# Patient Record
Sex: Female | Born: 1985 | Race: Black or African American | Hispanic: No | Smoking: Never smoker
Health system: Southern US, Community
[De-identification: ages and names within clinical notes are randomized; demographics above are authoritative.]

## PROBLEM LIST (undated history)

## (undated) ENCOUNTER — Emergency Department (HOSPITAL_COMMUNITY): Payer: PRIVATE HEALTH INSURANCE | Source: Home / Self Care

## (undated) ENCOUNTER — Inpatient Hospital Stay (HOSPITAL_COMMUNITY): Payer: Self-pay

## (undated) DIAGNOSIS — H539 Unspecified visual disturbance: Secondary | ICD-10-CM

## (undated) DIAGNOSIS — I509 Heart failure, unspecified: Secondary | ICD-10-CM

## (undated) DIAGNOSIS — D649 Anemia, unspecified: Secondary | ICD-10-CM

## (undated) DIAGNOSIS — J189 Pneumonia, unspecified organism: Secondary | ICD-10-CM

## (undated) DIAGNOSIS — G43909 Migraine, unspecified, not intractable, without status migrainosus: Secondary | ICD-10-CM

## (undated) DIAGNOSIS — I10 Essential (primary) hypertension: Secondary | ICD-10-CM

## (undated) DIAGNOSIS — F419 Anxiety disorder, unspecified: Secondary | ICD-10-CM

## (undated) DIAGNOSIS — E049 Nontoxic goiter, unspecified: Secondary | ICD-10-CM

## (undated) DIAGNOSIS — L0591 Pilonidal cyst without abscess: Secondary | ICD-10-CM

## (undated) DIAGNOSIS — O368390 Maternal care for abnormalities of the fetal heart rate or rhythm, unspecified trimester, not applicable or unspecified: Secondary | ICD-10-CM

## (undated) HISTORY — DX: Anxiety disorder, unspecified: F41.9

## (undated) HISTORY — PX: NO PAST SURGERIES: SHX2092

## (undated) HISTORY — DX: Pilonidal cyst without abscess: L05.91

## (undated) HISTORY — DX: Migraine, unspecified, not intractable, without status migrainosus: G43.909

## (undated) HISTORY — DX: Unspecified visual disturbance: H53.9

---
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
## 2009-05-06 ENCOUNTER — Inpatient Hospital Stay (HOSPITAL_COMMUNITY): Admission: AD | Admit: 2009-05-06 | Discharge: 2009-05-06 | Payer: Self-pay | Admitting: Obstetrics and Gynecology

## 2009-05-26 ENCOUNTER — Encounter: Admission: RE | Admit: 2009-05-26 | Discharge: 2009-05-26 | Payer: Self-pay | Admitting: Obstetrics and Gynecology

## 2009-07-10 ENCOUNTER — Inpatient Hospital Stay (HOSPITAL_COMMUNITY): Admission: AD | Admit: 2009-07-10 | Discharge: 2009-07-14 | Payer: Self-pay | Admitting: Obstetrics & Gynecology

## 2009-07-15 ENCOUNTER — Inpatient Hospital Stay (HOSPITAL_COMMUNITY): Admission: AD | Admit: 2009-07-15 | Discharge: 2009-07-17 | Payer: Self-pay | Admitting: Obstetrics & Gynecology

## 2010-05-29 ENCOUNTER — Emergency Department (HOSPITAL_COMMUNITY)
Admission: EM | Admit: 2010-05-29 | Discharge: 2010-05-29 | Payer: Self-pay | Source: Home / Self Care | Admitting: Emergency Medicine

## 2010-06-06 ENCOUNTER — Encounter: Admission: RE | Admit: 2010-06-06 | Discharge: 2010-06-06 | Payer: Self-pay | Admitting: Internal Medicine

## 2010-11-05 ENCOUNTER — Inpatient Hospital Stay (HOSPITAL_COMMUNITY): Admission: EM | Admit: 2010-11-05 | Discharge: 2010-11-06 | Payer: Self-pay | Source: Home / Self Care

## 2010-11-14 LAB — POCT I-STAT, CHEM 8
BUN: 16 mg/dL (ref 6–23)
Calcium, Ion: 1.06 mmol/L — ABNORMAL LOW (ref 1.12–1.32)
Chloride: 107 mEq/L (ref 96–112)
Creatinine, Ser: 0.8 mg/dL (ref 0.4–1.2)
Glucose, Bld: 131 mg/dL — ABNORMAL HIGH (ref 70–99)
HCT: 37 % (ref 36.0–46.0)
Hemoglobin: 12.6 g/dL (ref 12.0–15.0)
Potassium: 4.2 mEq/L (ref 3.5–5.1)
Sodium: 141 mEq/L (ref 135–145)
TCO2: 27 mmol/L (ref 0–100)

## 2010-11-14 LAB — URINALYSIS, ROUTINE W REFLEX MICROSCOPIC
Bilirubin Urine: NEGATIVE
Ketones, ur: 40 mg/dL — AB
Leukocytes, UA: NEGATIVE
Nitrite: NEGATIVE
Protein, ur: 30 mg/dL — AB
Specific Gravity, Urine: 1.039 — ABNORMAL HIGH (ref 1.005–1.030)
Urine Glucose, Fasting: NEGATIVE mg/dL
Urobilinogen, UA: 1 mg/dL (ref 0.0–1.0)
pH: 6 (ref 5.0–8.0)

## 2010-11-14 LAB — DIFFERENTIAL
Basophils Absolute: 0 10*3/uL (ref 0.0–0.1)
Basophils Absolute: 0 10*3/uL (ref 0.0–0.1)
Basophils Relative: 0 % (ref 0–1)
Basophils Relative: 0 % (ref 0–1)
Eosinophils Absolute: 0 10*3/uL (ref 0.0–0.7)
Eosinophils Absolute: 0 10*3/uL (ref 0.0–0.7)
Eosinophils Relative: 0 % (ref 0–5)
Eosinophils Relative: 0 % (ref 0–5)
Lymphocytes Relative: 20 % (ref 12–46)
Lymphocytes Relative: 19 % (ref 12–46)
Lymphs Abs: 2.2 10*3/uL (ref 0.7–4.0)
Lymphs Abs: 1.8 10*3/uL (ref 0.7–4.0)
Monocytes Absolute: 2.6 10*3/uL — ABNORMAL HIGH (ref 0.1–1.0)
Monocytes Absolute: 1 10*3/uL (ref 0.1–1.0)
Monocytes Relative: 24 % — ABNORMAL HIGH (ref 3–12)
Monocytes Relative: 10 % (ref 3–12)
Neutro Abs: 6 10*3/uL (ref 1.7–7.7)
Neutro Abs: 6.9 10*3/uL (ref 1.7–7.7)
Neutrophils Relative %: 56 % (ref 43–77)
Neutrophils Relative %: 71 % (ref 43–77)

## 2010-11-14 LAB — CULTURE, BLOOD (ROUTINE X 2)
Culture  Setup Time: 201201072032
Culture  Setup Time: 201201072033
Culture: NO GROWTH
Culture: NO GROWTH

## 2010-11-14 LAB — COMPREHENSIVE METABOLIC PANEL
ALT: 10 U/L (ref 0–35)
AST: 11 U/L (ref 0–37)
Albumin: 3.4 g/dL — ABNORMAL LOW (ref 3.5–5.2)
Alkaline Phosphatase: 44 U/L (ref 39–117)
BUN: 14 mg/dL (ref 6–23)
CO2: 27 mEq/L (ref 19–32)
Calcium: 9 mg/dL (ref 8.4–10.5)
Chloride: 105 mEq/L (ref 96–112)
Creatinine, Ser: 0.84 mg/dL (ref 0.4–1.2)
GFR calc Af Amer: >60 mL/min (ref >60)
GFR calc non Af Amer: >60 mL/min (ref >60)
Glucose, Bld: 131 mg/dL — ABNORMAL HIGH (ref 70–99)
Potassium: 4.9 mEq/L (ref 3.5–5.1)
Sodium: 138 mEq/L (ref 135–145)
Total Bilirubin: 0.5 mg/dL (ref 0.3–1.2)
Total Protein: 6.8 g/dL (ref 6.0–8.3)

## 2010-11-14 LAB — MAGNESIUM: Magnesium: 2.3 mg/dL (ref 1.5–2.5)

## 2010-11-14 LAB — CBC
HCT: 35.4 % — ABNORMAL LOW (ref 36.0–46.0)
HCT: 35.9 % — ABNORMAL LOW (ref 36.0–46.0)
Hemoglobin: 11.2 g/dL — ABNORMAL LOW (ref 12.0–15.0)
Hemoglobin: 11.3 g/dL — ABNORMAL LOW (ref 12.0–15.0)
MCH: 26.7 pg (ref 26.0–34.0)
MCH: 26.3 pg (ref 26.0–34.0)
MCHC: 31.6 g/dL (ref 30.0–36.0)
MCHC: 31.5 g/dL (ref 30.0–36.0)
MCV: 84.3 fL (ref 78.0–100.0)
MCV: 83.7 fL (ref 78.0–100.0)
Platelets: 287 10*3/uL (ref 150–400)
Platelets: 352 10*3/uL (ref 150–400)
RBC: 4.2 MIL/uL (ref 3.87–5.11)
RBC: 4.29 MIL/uL (ref 3.87–5.11)
RDW: 13.4 % (ref 11.5–15.5)
RDW: 13.3 % (ref 11.5–15.5)
WBC: 10.8 10*3/uL — ABNORMAL HIGH (ref 4.0–10.5)
WBC: 9.7 10*3/uL (ref 4.0–10.5)

## 2010-11-14 LAB — URINE MICROSCOPIC-ADD ON

## 2010-11-14 LAB — STREP A DNA PROBE: Group A Strep Probe: NEGATIVE

## 2010-11-14 LAB — URINE CULTURE
Colony Count: 5000
Culture  Setup Time: 201201080026

## 2010-11-14 LAB — MONONUCLEOSIS SCREEN: Mono Screen: NEGATIVE

## 2010-11-14 LAB — T4, FREE: Free T4: 1.11 ng/dL (ref 0.80–1.80)

## 2010-11-14 LAB — PHOSPHORUS: Phosphorus: 4.1 mg/dL (ref 2.3–4.6)

## 2010-11-14 LAB — TSH: TSH: 0.289 u[IU]/mL — ABNORMAL LOW (ref 0.350–4.500)

## 2011-02-03 LAB — COMPREHENSIVE METABOLIC PANEL
ALT: 18 U/L (ref 0–35)
ALT: 23 U/L (ref 0–35)
ALT: 14 U/L (ref 0–35)
AST: 19 U/L (ref 0–37)
AST: 24 U/L (ref 0–37)
AST: 21 U/L (ref 0–37)
AST: 21 U/L (ref 0–37)
Albumin: 2.6 g/dL — ABNORMAL LOW (ref 3.5–5.2)
Albumin: 3.1 g/dL — ABNORMAL LOW (ref 3.5–5.2)
Albumin: 2.4 g/dL — ABNORMAL LOW (ref 3.5–5.2)
Albumin: 2.4 g/dL — ABNORMAL LOW (ref 3.5–5.2)
Alkaline Phosphatase: 103 U/L (ref 39–117)
Alkaline Phosphatase: 92 U/L (ref 39–117)
Alkaline Phosphatase: 87 U/L (ref 39–117)
BUN: 4 mg/dL — ABNORMAL LOW (ref 6–23)
BUN: 5 mg/dL — ABNORMAL LOW (ref 6–23)
CO2: 24 mEq/L (ref 19–32)
CO2: 25 mEq/L (ref 19–32)
Calcium: 7.8 mg/dL — ABNORMAL LOW (ref 8.4–10.5)
Calcium: 9.2 mg/dL (ref 8.4–10.5)
Calcium: 8.8 mg/dL (ref 8.4–10.5)
Chloride: 108 mEq/L (ref 96–112)
Chloride: 108 mEq/L (ref 96–112)
Creatinine, Ser: 0.74 mg/dL (ref 0.4–1.2)
Creatinine, Ser: 0.77 mg/dL (ref 0.4–1.2)
Creatinine, Ser: 0.88 mg/dL (ref 0.4–1.2)
GFR calc Af Amer: >60 mL/min (ref >60)
GFR calc Af Amer: >60 mL/min (ref >60)
GFR calc Af Amer: >60 mL/min (ref >60)
GFR calc Af Amer: >60 mL/min (ref >60)
GFR calc non Af Amer: >60 mL/min (ref >60)
GFR calc non Af Amer: >60 mL/min (ref >60)
GFR calc non Af Amer: >60 mL/min (ref >60)
Glucose, Bld: 103 mg/dL — ABNORMAL HIGH (ref 70–99)
Glucose, Bld: 104 mg/dL — ABNORMAL HIGH (ref 70–99)
Glucose, Bld: 75 mg/dL (ref 70–99)
Potassium: 3.7 mEq/L (ref 3.5–5.1)
Potassium: 3.9 mEq/L (ref 3.5–5.1)
Potassium: 4.2 mEq/L (ref 3.5–5.1)
Sodium: 137 mEq/L (ref 135–145)
Sodium: 140 mEq/L (ref 135–145)
Sodium: 138 mEq/L (ref 135–145)
Total Bilirubin: 0.5 mg/dL (ref 0.3–1.2)
Total Bilirubin: 0.5 mg/dL (ref 0.3–1.2)
Total Protein: 5.5 g/dL — ABNORMAL LOW (ref 6.0–8.3)
Total Protein: 6.1 g/dL (ref 6.0–8.3)
Total Protein: 5.3 g/dL — ABNORMAL LOW (ref 6.0–8.3)

## 2011-02-03 LAB — CBC
HCT: 31.6 % — ABNORMAL LOW (ref 36.0–46.0)
HCT: 34.4 % — ABNORMAL LOW (ref 36.0–46.0)
Hemoglobin: 10.2 g/dL — ABNORMAL LOW (ref 12.0–15.0)
Hemoglobin: 11 g/dL — ABNORMAL LOW (ref 12.0–15.0)
Hemoglobin: 11.3 g/dL — ABNORMAL LOW (ref 12.0–15.0)
Hemoglobin: 9.5 g/dL — ABNORMAL LOW (ref 12.0–15.0)
MCHC: 32.1 g/dL (ref 30.0–36.0)
MCHC: 32.3 g/dL (ref 30.0–36.0)
MCHC: 32.3 g/dL (ref 30.0–36.0)
MCV: 81 fL (ref 78.0–100.0)
MCV: 82.1 fL (ref 78.0–100.0)
MCV: 82.5 fL (ref 78.0–100.0)
Platelets: 351 10*3/uL (ref 150–400)
Platelets: 353 10*3/uL (ref 150–400)
Platelets: 243 10*3/uL (ref 150–400)
RBC: 3.85 MIL/uL — ABNORMAL LOW (ref 3.87–5.11)
RBC: 4.25 MIL/uL (ref 3.87–5.11)
RBC: 3.58 MIL/uL — ABNORMAL LOW (ref 3.87–5.11)
RBC: 4.28 MIL/uL (ref 3.87–5.11)
RDW: 13.8 % (ref 11.5–15.5)
RDW: 14.3 % (ref 11.5–15.5)
RDW: 13.6 % (ref 11.5–15.5)
WBC: 12.9 10*3/uL — ABNORMAL HIGH (ref 4.0–10.5)
WBC: 15.3 10*3/uL — ABNORMAL HIGH (ref 4.0–10.5)

## 2011-02-03 LAB — URINALYSIS, ROUTINE W REFLEX MICROSCOPIC
Bilirubin Urine: NEGATIVE
Glucose, UA: NEGATIVE mg/dL
Ketones, ur: NEGATIVE mg/dL
Nitrite: NEGATIVE
pH: 6 (ref 5.0–8.0)

## 2011-02-03 LAB — LACTATE DEHYDROGENASE
LDH: 179 U/L (ref 94–250)
LDH: 223 U/L (ref 94–250)
LDH: 172 U/L (ref 94–250)
LDH: 189 U/L (ref 94–250)

## 2011-02-03 LAB — URINE MICROSCOPIC-ADD ON

## 2011-02-03 LAB — GLUCOSE, CAPILLARY
Glucose-Capillary: 101 mg/dL — ABNORMAL HIGH (ref 70–99)
Glucose-Capillary: 103 mg/dL — ABNORMAL HIGH (ref 70–99)
Glucose-Capillary: 71 mg/dL (ref 70–99)
Glucose-Capillary: 84 mg/dL (ref 70–99)

## 2011-02-03 LAB — DIFFERENTIAL
Basophils Absolute: 0 10*3/uL (ref 0.0–0.1)
Eosinophils Relative: 2 % (ref 0–5)
Lymphocytes Relative: 18 % (ref 12–46)
Neutrophils Relative %: 69 % (ref 43–77)

## 2011-02-03 LAB — MAGNESIUM: Magnesium: 6.6 mg/dL (ref 1.5–2.5)

## 2011-02-03 LAB — URIC ACID: Uric Acid, Serum: 6.7 mg/dL (ref 2.4–7.0)

## 2011-09-05 ENCOUNTER — Encounter: Payer: Self-pay | Admitting: *Deleted

## 2011-09-05 ENCOUNTER — Emergency Department (HOSPITAL_COMMUNITY)
Admission: EM | Admit: 2011-09-05 | Discharge: 2011-09-05 | Disposition: A | Discharge status: Home / Self Care | Payer: PRIVATE HEALTH INSURANCE | Attending: Emergency Medicine | Admitting: Emergency Medicine

## 2011-09-05 DIAGNOSIS — R197 Diarrhea, unspecified: Secondary | ICD-10-CM | POA: Insufficient documentation

## 2011-09-05 DIAGNOSIS — R109 Unspecified abdominal pain: Secondary | ICD-10-CM | POA: Insufficient documentation

## 2011-09-05 DIAGNOSIS — Z79899 Other long term (current) drug therapy: Secondary | ICD-10-CM | POA: Insufficient documentation

## 2011-09-05 DIAGNOSIS — R110 Nausea: Secondary | ICD-10-CM | POA: Insufficient documentation

## 2011-09-05 DIAGNOSIS — E119 Type 2 diabetes mellitus without complications: Secondary | ICD-10-CM | POA: Insufficient documentation

## 2011-09-05 MED ORDER — OXYCODONE-ACETAMINOPHEN 5-325 MG PO TABS
1.0000 | ORAL_TABLET | Freq: Once | ORAL | Status: AC
  Administered 2011-09-05: 1 via ORAL
  Filled 2011-09-05: qty 1

## 2011-09-05 MED ORDER — ONDANSETRON HCL 4 MG PO TABS: 4.0000 mg | ORAL_TABLET | Freq: Four times a day (QID) | ORAL | Status: AC

## 2011-09-05 MED ORDER — ONDANSETRON HCL 8 MG PO TABS: 8.0000 mg | ORAL_TABLET | Freq: Once | ORAL | Status: DC

## 2011-09-05 MED ORDER — ONDANSETRON 4 MG PO TBDP
ORAL_TABLET | ORAL | Status: AC
  Administered 2011-09-05: 8 mg
  Filled 2011-09-05: qty 2

## 2011-09-05 NOTE — ED Notes (Signed)
Patient states she has been having diarhhea since 0130.  She has had multiple episodes of diarrhea and reports nausea.  She is also complaining of back pin

## 2011-09-05 NOTE — ED Provider Notes (Signed)
History     CSN: [id number] Arrival date & time: 09/05/2011  7:06 AM   First MD Initiated Contact with Patient 09/05/11 [phone]      Chief Complaint  Patient presents with  . Diarrhea  . Nausea     Patient is a 25 y.o. female presenting with diarrhea. The history is provided by the patient.  Diarrhea The primary symptoms include diarrhea.   patient reports nonbloody diarrhea since 1:30 the morning today.  She also reports nausea without vomiting.  She denies fever or chills.  She reports crampy lower abdominal discomfort.  She denies dysuria she denies urinary frequency.  Denies blood in her stool.  She denies any sick contacts.  Nothing worsens her symptoms.  Nothing improves her symptoms. her symptoms are moderate in severity.  Past Medical History  Diagnosis Date  . Diabetes mellitus     History reviewed. No pertinent past surgical history.  History reviewed. No pertinent family history.  History  Substance Use Topics  . Smoking status: Never Smoker   . Smokeless tobacco: Not on file  . Alcohol Use: Yes     occassionally    OB History    Grav Para Term Preterm Abortions TAB SAB Ect Mult Living                  Review of Systems  Gastrointestinal: Positive for diarrhea.  All other systems reviewed and are negative.    Allergies  Review of patient's allergies indicates no known allergies.  Home Medications   Current Outpatient Rx  Name Route Sig Dispense Refill  . ONDANSETRON HCL 4 MG PO TABS Oral Take 1 tablet (4 mg total) by mouth every 6 (six) hours. 12 tablet 0    BP 134/99  Pulse 94  Temp 97.8 F (36.6 C)  Resp 18  SpO2 99%  Physical Exam  Nursing note and vitals reviewed. Constitutional: She is oriented to person, place, and time. She appears well-developed and well-nourished. No distress.  HENT:  Head: Normocephalic and atraumatic.  Eyes: EOM are normal.  Neck: Normal range of motion.  Cardiovascular: Normal rate, regular rhythm and normal  heart sounds.   Pulmonary/Chest: Effort normal and breath sounds normal.  Abdominal: Soft. She exhibits no distension. There is no tenderness.  Musculoskeletal: Normal range of motion.  Neurological: She is alert and oriented to person, place, and time.  Skin: Skin is warm and dry.  Psychiatric: She has a normal mood and affect. Judgment normal.    ED Course  Procedures (including critical care time)   Labs Reviewed  PREGNANCY, URINE   No results found.   1. Diarrhea       MDM  Well-appearing.  Her abdomen is benign urine pregnancy test is negative she is hydrated appearing at this time.  Nausea improved with Zofran.  Patient requested pain medicine for her crampy abdominal pain.  Percocet was given this will be discharged home with Zofran and will followup with her Dr. in 2-3 days if not improved        Lyanne Co, MD 09/05/11 [phone]

## 2012-09-04 ENCOUNTER — Encounter (HOSPITAL_COMMUNITY): Payer: Self-pay | Admitting: Adult Health

## 2012-09-04 ENCOUNTER — Emergency Department (HOSPITAL_COMMUNITY)
Admission: EM | Admit: 2012-09-04 | Discharge: 2012-09-05 | Disposition: A | Discharge status: Home / Self Care | Payer: PRIVATE HEALTH INSURANCE | Attending: Emergency Medicine | Admitting: Emergency Medicine

## 2012-09-04 DIAGNOSIS — Z7982 Long term (current) use of aspirin: Secondary | ICD-10-CM | POA: Insufficient documentation

## 2012-09-04 DIAGNOSIS — Z79899 Other long term (current) drug therapy: Secondary | ICD-10-CM | POA: Insufficient documentation

## 2012-09-04 DIAGNOSIS — E119 Type 2 diabetes mellitus without complications: Secondary | ICD-10-CM | POA: Insufficient documentation

## 2012-09-04 DIAGNOSIS — I10 Essential (primary) hypertension: Secondary | ICD-10-CM | POA: Insufficient documentation

## 2012-09-04 DIAGNOSIS — R112 Nausea with vomiting, unspecified: Secondary | ICD-10-CM | POA: Insufficient documentation

## 2012-09-04 DIAGNOSIS — G43909 Migraine, unspecified, not intractable, without status migrainosus: Secondary | ICD-10-CM

## 2012-09-04 HISTORY — DX: Essential (primary) hypertension: I10

## 2012-09-04 MED ORDER — METOCLOPRAMIDE HCL 5 MG/ML IJ SOLN
10.0000 mg | Freq: Once | INTRAMUSCULAR | Status: AC
  Administered 2012-09-04: 10 mg via INTRAVENOUS
  Filled 2012-09-04: qty 2

## 2012-09-04 MED ORDER — DIPHENHYDRAMINE HCL 50 MG/ML IJ SOLN
25.0000 mg | Freq: Once | INTRAMUSCULAR | Status: AC
  Administered 2012-09-04: 25 mg via INTRAVENOUS
  Filled 2012-09-04: qty 1

## 2012-09-04 MED ORDER — DEXAMETHASONE SODIUM PHOSPHATE 10 MG/ML IJ SOLN
10.0000 mg | Freq: Once | INTRAMUSCULAR | Status: AC
  Administered 2012-09-04: 10 mg via INTRAVENOUS
  Filled 2012-09-04: qty 1

## 2012-09-04 NOTE — ED Notes (Signed)
CBG 171 

## 2012-09-04 NOTE — ED Provider Notes (Addendum)
History     CSN: [id number]  Arrival date & time 09/04/12  2031   First MD Initiated Contact with Patient 09/04/12 2216      Chief Complaint  Patient presents with  . Migraine    (Consider location/radiation/quality/duration/timing/severity/associated sxs/prior treatment) Patient is a 26 y.o. female presenting with migraines. The history is provided by the patient.  Migraine This is a recurrent problem. The current episode started yesterday. The problem occurs constantly. The problem has not changed since onset.Associated symptoms include headaches. Pertinent negatives include no chest pain, no abdominal pain and no shortness of breath. Associated symptoms comments: Vomiting yesterday. No focal deficits. Exacerbated by: bright lights and loud noise. Nothing relieves the symptoms. Treatments tried: NSAIDs. The treatment provided no relief.    Past Medical History  Diagnosis Date  . Diabetes mellitus   . Hypertension     History reviewed. No pertinent past surgical history.  History reviewed. No pertinent family history.  History  Substance Use Topics  . Smoking status: Never Smoker   . Smokeless tobacco: Not on file  . Alcohol Use: Yes     Comment: occassionally    OB History    Grav Para Term Preterm Abortions TAB SAB Ect Mult Living                  Review of Systems  Constitutional: Negative for fever.  Respiratory: Negative for cough and shortness of breath.   Cardiovascular: Negative for chest pain and leg swelling.  Gastrointestinal: Positive for nausea and vomiting. Negative for abdominal pain.  Neurological: Positive for headaches. Negative for speech difficulty and weakness.  All other systems reviewed and are negative.    Allergies  Review of patient's allergies indicates no known allergies.  Home Medications   Current Outpatient Rx  Name  Route  Sig  Dispense  Refill  . ASPIRIN-ACETAMINOPHEN-CAFFEINE 250-250-65 MG PO TABS   Oral   Take 1 tablet  by mouth every 6 (six) hours as needed. For headache         . GLIPIZIDE ER 5 MG PO TB24   Oral   Take 5 mg by mouth daily.         . IBUPROFEN 800 MG PO TABS   Oral   Take 800 mg by mouth every 8 (eight) hours as needed. For pain         . LISINOPRIL 20 MG PO TABS   Oral   Take 20 mg by mouth daily.           BP 157/92  Pulse 70  Temp 98.1 F (36.7 C) (Oral)  Resp 18  SpO2 100%  Physical Exam  Nursing note and vitals reviewed. Constitutional: She is oriented to person, place, and time. She appears well-developed and well-nourished. No distress.  HENT:  Head: Normocephalic and atraumatic.  Mouth/Throat: Oropharynx is clear and moist.  Eyes: Conjunctivae normal and EOM are normal. Pupils are equal, round, and reactive to light. Right eye exhibits no discharge. Left eye exhibits no discharge.       photophobia  Neck: Normal range of motion. Neck supple. No spinous process tenderness present. No rigidity. No Brudzinski's sign and no Kernig's sign noted.  Cardiovascular: Normal rate, normal heart sounds and intact distal pulses.   No murmur heard. Pulmonary/Chest: Effort normal and breath sounds normal. No respiratory distress. She has no wheezes. She has no rales.  Abdominal: Soft. She exhibits no distension. There is no tenderness.  Musculoskeletal: Normal range of  motion. She exhibits no edema and no tenderness.  Lymphadenopathy:    She has no cervical adenopathy.  Neurological: She is alert and oriented to person, place, and time. She has normal strength. No cranial nerve deficit or sensory deficit. Coordination and gait normal. GCS eye subscore is 4. GCS verbal subscore is 5. GCS motor subscore is 6.  Skin: Skin is warm and dry.  Psychiatric: She has a normal mood and affect. Her behavior is normal.    ED Course  Procedures (including critical care time)  Labs Reviewed  GLUCOSE, CAPILLARY - Abnormal; Notable for the following:    Glucose-Capillary 171 (*)       All other components within normal limits   No results found.   1. Migraine       MDM   Pt with typical migraine HA without sx suggestive of SAH(sudden onset, worst of life, or deficits), infection, or cavernous vein thrombosis.  Normal neuro exam and vital signs. Will give HA cocktail and on re-eval.  11:39 PM Pt feeling a little better but will d/c home.      Gwyneth Sprout, MD 09/04/12 [id number]  Gwyneth Sprout, MD 09/04/12 [phone]

## 2012-09-04 NOTE — ED Notes (Addendum)
presents with left sided migraine that began yesterday but went away this morning and came back around 12 and gradually worsened. associated with light and sound sensitivity and nausea. Pt ia alert and oriented, neurologically intact. Took Ibuprofen and excedrin took pain from 9/10 -7/10.  Has a history of migraines

## 2012-09-10 ENCOUNTER — Ambulatory Visit (INDEPENDENT_AMBULATORY_CARE_PROVIDER_SITE_OTHER): Payer: PRIVATE HEALTH INSURANCE | Admitting: General Surgery

## 2012-09-10 ENCOUNTER — Encounter (INDEPENDENT_AMBULATORY_CARE_PROVIDER_SITE_OTHER): Payer: Self-pay | Admitting: General Surgery

## 2012-09-10 VITALS — BP 126/82 | HR 80 | Temp 98.6°F | Resp 20 | Ht 62.0 in | Wt 175.0 lb

## 2012-09-10 DIAGNOSIS — L0501 Pilonidal cyst with abscess: Secondary | ICD-10-CM

## 2012-09-10 NOTE — Progress Notes (Signed)
Patient ID: Sara [name], female   DOB: [birth year], 26 y.o.   MRN: [id number]  Chief Complaint  Patient presents with  . Follow-up    pilo cyst infected    HPI Sara [name] is a 26 y.o. female.  She is referred by Milus Height, NP at  [facility] medicine at [facility]. She is referred for management of a pilonidal cyst or abscess.  The patient has had some inflammation in this area before but it was very minor and did not require medical intervention. She now presents with a five-day history of progressive pain and swelling. She denies fever or chills. She denies drainage.  Comorbidities include non-insulin-dependent diabetes, hypertension, anxiety, headaches. HPI  Past Medical History  Diagnosis Date  . Infected pilonidal cyst   . Anxiety     diagnosed in 2007  . Diabetes mellitus     type 2 - uncontrolled  . Hypertension     benign essential    No past surgical history on file.  No family history on file.  Social History History  Substance Use Topics  . Smoking status: Never Smoker   . Smokeless tobacco: Not on file  . Alcohol Use: Yes     Comment: occassionally    No Known Allergies  Current Outpatient Prescriptions  Medication Sig Dispense Refill  . ALPRAZolam (XANAX) 0.5 MG tablet Take 0.5 mg by mouth as needed.      Marland Kitchen aspirin-acetaminophen-caffeine (EXCEDRIN MIGRAINE) 250-250-65 MG per tablet Take 1 tablet by mouth every 6 (six) hours as needed. For headache      . glipiZIDE (GLUCOTROL XL) 5 MG 24 hr tablet Take 5 mg by mouth daily.      Marland Kitchen HYDROcodone-acetaminophen (NORCO/VICODIN) 5-325 MG per tablet Take 1 tablet by mouth every 6 (six) hours as needed.      Marland Kitchen ibuprofen (ADVIL,MOTRIN) 800 MG tablet Take 800 mg by mouth every 8 (eight) hours as needed. For pain      . lisinopril (PRINIVIL,ZESTRIL) 20 MG tablet Take 20 mg by mouth daily.      . SUMAtriptan (IMITREX) 100 MG tablet Take 100 mg by mouth as needed. As directed one at headache onset, may  repeat in 2 hours if still persists/do not use more than 2 in 24 hours.        Review of Systems Review of Systems  Constitutional: Negative for fever, chills and unexpected weight change.  HENT: Negative for hearing loss, congestion, sore throat, trouble swallowing and voice change.   Eyes: Negative for visual disturbance.  Respiratory: Negative for cough and wheezing.   Cardiovascular: Negative for chest pain, palpitations and leg swelling.  Gastrointestinal: Negative for nausea, vomiting, abdominal pain, diarrhea, constipation, blood in stool, abdominal distention and anal bleeding.  Genitourinary: Negative for hematuria, vaginal bleeding and difficulty urinating.  Musculoskeletal: Negative for arthralgias.  Skin: Positive for color change. Negative for rash and wound.  Neurological: Negative for seizures, syncope and headaches.  Hematological: Negative for adenopathy. Does not bruise/bleed easily.  Psychiatric/Behavioral: Negative for confusion.    Blood pressure 126/82, pulse 80, temperature 98.6 F (37 C), temperature source Temporal, resp. rate 20, height 5\' 2"  (1.575 m), weight 175 lb (79.379 kg).  Physical Exam Physical Exam  Constitutional: She is oriented to person, place, and time. She appears well-developed and well-nourished. No distress.  HENT:  Head: Normocephalic and atraumatic.  Eyes: Conjunctivae normal and EOM are normal. Pupils are equal, round, and reactive to light.  Musculoskeletal: She exhibits no  edema and no tenderness.  Neurological: She is alert and oriented to person, place, and time. She displays normal reflexes. She exhibits normal muscle tone. Coordination normal.  Skin: Skin is warm and dry. No rash noted. She is not diaphoretic. There is erythema. No pallor.       Pilonidal abscess present mostly on the left side. Betadine prep, 1% Xylocaine and epinephrine, parasagittal incision made, abscess drained, packed loosely with iodoform gauze. Painful but  tolerated well.  Psychiatric: She has a normal mood and affect. Her behavior is normal. Judgment and thought content normal.    Data Reviewed Office notes from [place] [facility]  Assessment    Pilonidal abscess, drained in the office today Hypertension Anxiety Non-insulin-dependent diabetes    Plan    Incision and drainage performed today. Wound packed loosely The patient has prescription for Septra DS. Take this until all gone Prescription for Percocet given Warm tub baths 3 times daily Remove packing in 24-36 hours and did not repack Return to office 2 weeks, sooner if not rapidly and improving.       Angelia Mould. Derrell Lolling, M.D., [facility] General and Minimally invasive Surgery Breast and Colorectal Surgery Office:   [phone] Pager:   [phone]  09/10/2012, 2:49 PM

## 2012-09-10 NOTE — Patient Instructions (Signed)
Take a warm tub bath 2 or 3 times a day. Keep clean dry gauze on the wound to absorb the drainage.  Remove the packing in 36-48 hours;   do not repack  You will be given a prescription for narcotic pain medication. Do not drive a car if you are taking this medication  It is important for you to fill the prescription for the antibiotic(Septra DS) that was given to you yesterday. Take all these antibiotics as prescribed until they're all gone.  Return to see Dr. Derrell Lolling in 2 weeks, sooner if you do not rapidly improve.

## 2012-09-30 ENCOUNTER — Ambulatory Visit (INDEPENDENT_AMBULATORY_CARE_PROVIDER_SITE_OTHER): Payer: PRIVATE HEALTH INSURANCE | Admitting: General Surgery

## 2012-09-30 ENCOUNTER — Encounter (INDEPENDENT_AMBULATORY_CARE_PROVIDER_SITE_OTHER): Payer: PRIVATE HEALTH INSURANCE | Admitting: General Surgery

## 2012-09-30 ENCOUNTER — Encounter (INDEPENDENT_AMBULATORY_CARE_PROVIDER_SITE_OTHER): Payer: Self-pay | Admitting: General Surgery

## 2012-09-30 VITALS — BP 122/70 | HR 74 | Temp 97.8°F | Resp 16 | Ht 62.0 in | Wt 171.5 lb

## 2012-09-30 DIAGNOSIS — L0501 Pilonidal cyst with abscess: Secondary | ICD-10-CM

## 2012-09-30 NOTE — Patient Instructions (Signed)
You had a pilonidal cyst which was complicated by an abscess, which is an infection. This was drained in the office with a minor surgical procedure at your last visit. This has now completely healed.  I do not recommend any further surgery at this point. You may need further surgery if this continues to recur.  You may resume normal activities now, except that I would avoid a bicycle riding, motorcycle riding and  horse back riding for about 3 weeks. On January 1, you may resume normal activities without restriction.  Return to see Dr. Derrell Lolling if further problems arise.

## 2012-09-30 NOTE — Progress Notes (Signed)
Patient ID: Sara [name], female   DOB: June 16, 1986, 26 y.o.   MRN: [id number] History: This patient what underwent incision and drainage of a Pilonidal  abscess on 09/10/2012 , limited to the left side. She has completed her course of antibiotics. She says the wound has healed. There is no more drainage, no pain, and no bleeding.  Exam: Patient looks well. No distress Incision just to the left of the midline  has completely healed. This is nontender. There is no drainage. No sign of infection. No sign of residual fluid collection  Assessment:, Pilonidal Abscess, initial episode, now completely healed following incision and drainage  Plan :  I discussed the natural history of the  abscess with her. I advised against definitive surgical intervention at this time since this was her initial episode. She was advised that this may or may not recur, and may or may not require more extensive surgery. Return to see me when necessary Activities discussed.    Angelia Mould. Derrell Lolling, M.D., [facility] General and Minimally invasive Surgery Breast and Colorectal Surgery Office:   [phone] Pager:   [phone]

## 2012-12-14 ENCOUNTER — Other Ambulatory Visit: Payer: Self-pay

## 2013-02-03 ENCOUNTER — Emergency Department (HOSPITAL_COMMUNITY)
Admission: EM | Admit: 2013-02-03 | Discharge: 2013-02-04 | Disposition: A | Discharge status: Home / Self Care | Payer: No Typology Code available for payment source | Attending: Emergency Medicine | Admitting: Emergency Medicine

## 2013-02-03 ENCOUNTER — Encounter (HOSPITAL_COMMUNITY): Payer: Self-pay | Admitting: *Deleted

## 2013-02-03 DIAGNOSIS — R51 Headache: Secondary | ICD-10-CM | POA: Insufficient documentation

## 2013-02-03 DIAGNOSIS — IMO0001 Reserved for inherently not codable concepts without codable children: Secondary | ICD-10-CM | POA: Insufficient documentation

## 2013-02-03 DIAGNOSIS — R197 Diarrhea, unspecified: Secondary | ICD-10-CM | POA: Insufficient documentation

## 2013-02-03 DIAGNOSIS — Z3202 Encounter for pregnancy test, result negative: Secondary | ICD-10-CM | POA: Insufficient documentation

## 2013-02-03 DIAGNOSIS — B349 Viral infection, unspecified: Secondary | ICD-10-CM

## 2013-02-03 DIAGNOSIS — E119 Type 2 diabetes mellitus without complications: Secondary | ICD-10-CM | POA: Insufficient documentation

## 2013-02-03 DIAGNOSIS — I10 Essential (primary) hypertension: Secondary | ICD-10-CM | POA: Insufficient documentation

## 2013-02-03 DIAGNOSIS — R6883 Chills (without fever): Secondary | ICD-10-CM | POA: Insufficient documentation

## 2013-02-03 DIAGNOSIS — Z79899 Other long term (current) drug therapy: Secondary | ICD-10-CM | POA: Insufficient documentation

## 2013-02-03 DIAGNOSIS — Z8659 Personal history of other mental and behavioral disorders: Secondary | ICD-10-CM | POA: Insufficient documentation

## 2013-02-03 DIAGNOSIS — B9789 Other viral agents as the cause of diseases classified elsewhere: Secondary | ICD-10-CM | POA: Insufficient documentation

## 2013-02-03 DIAGNOSIS — R112 Nausea with vomiting, unspecified: Secondary | ICD-10-CM | POA: Insufficient documentation

## 2013-02-03 DIAGNOSIS — R630 Anorexia: Secondary | ICD-10-CM | POA: Insufficient documentation

## 2013-02-03 LAB — COMPREHENSIVE METABOLIC PANEL WITH GFR
ALT: 10 U/L (ref 0–35)
AST: 12 U/L (ref 0–37)
Alkaline Phosphatase: 83 U/L (ref 39–117)
CO2: 24 meq/L (ref 19–32)
Calcium: 9.5 mg/dL (ref 8.4–10.5)
Chloride: 98 meq/L (ref 96–112)
GFR calc Af Amer: >90 mL/min (ref >90)
GFR calc non Af Amer: >90 mL/min (ref >90)
Glucose, Bld: 250 mg/dL — ABNORMAL HIGH (ref 70–99)
Sodium: 134 meq/L — ABNORMAL LOW (ref 135–145)
Total Bilirubin: 0.6 mg/dL (ref 0.3–1.2)

## 2013-02-03 LAB — CBC WITH DIFFERENTIAL/PLATELET
Basophils Absolute: 0 K/uL (ref 0.0–0.1)
Basophils Relative: 0 % (ref 0–1)
Eosinophils Absolute: 0 10*3/uL (ref 0.0–0.7)
Eosinophils Relative: 0 % (ref 0–5)
HCT: 39.8 % (ref 36.0–46.0)
Hemoglobin: 13.3 g/dL (ref 12.0–15.0)
Lymphocytes Relative: 4 % — ABNORMAL LOW (ref 12–46)
Lymphs Abs: 0.4 K/uL — ABNORMAL LOW (ref 0.7–4.0)
MCH: 25.8 pg — ABNORMAL LOW (ref 26.0–34.0)
MCHC: 33.4 g/dL (ref 30.0–36.0)
MCV: 77.1 fL — ABNORMAL LOW (ref 78.0–100.0)
Monocytes Absolute: 1.1 K/uL — ABNORMAL HIGH (ref 0.1–1.0)
Monocytes Relative: 9 % (ref 3–12)
Neutro Abs: 10.2 K/uL — ABNORMAL HIGH (ref 1.7–7.7)
Neutrophils Relative %: 87 % — ABNORMAL HIGH (ref 43–77)
Platelets: 293 K/uL (ref 150–400)
RBC: 5.16 MIL/uL — ABNORMAL HIGH (ref 3.87–5.11)
RDW: 13.7 % (ref 11.5–15.5)
WBC: 11.8 K/uL — ABNORMAL HIGH (ref 4.0–10.5)

## 2013-02-03 LAB — GLUCOSE, CAPILLARY: Glucose-Capillary: 167 mg/dL — ABNORMAL HIGH (ref 70–99)

## 2013-02-03 LAB — URINALYSIS, ROUTINE W REFLEX MICROSCOPIC
Bilirubin Urine: NEGATIVE
Glucose, UA: >1000 mg/dL — AB
Hgb urine dipstick: NEGATIVE
Ketones, ur: >80 mg/dL — AB
Leukocytes, UA: NEGATIVE
Nitrite: NEGATIVE
Protein, ur: NEGATIVE mg/dL
Specific Gravity, Urine: 1.025 (ref 1.005–1.030)
Urobilinogen, UA: 0.2 mg/dL (ref 0.0–1.0)
pH: 6 (ref 5.0–8.0)

## 2013-02-03 LAB — LIPASE, BLOOD: Lipase: 16 U/L (ref 11–59)

## 2013-02-03 LAB — COMPREHENSIVE METABOLIC PANEL
Albumin: 4.6 g/dL (ref 3.5–5.2)
BUN: 13 mg/dL (ref 6–23)
Creatinine, Ser: 0.62 mg/dL (ref 0.50–1.10)
Potassium: 4 mEq/L (ref 3.5–5.1)
Total Protein: 8.1 g/dL (ref 6.0–8.3)

## 2013-02-03 LAB — URINE MICROSCOPIC-ADD ON

## 2013-02-03 LAB — POCT PREGNANCY, URINE: Preg Test, Ur: NEGATIVE

## 2013-02-03 MED ORDER — DEXAMETHASONE SODIUM PHOSPHATE 10 MG/ML IJ SOLN
10.0000 mg | Freq: Once | INTRAMUSCULAR | Status: AC
  Administered 2013-02-03: 10 mg via INTRAVENOUS
  Filled 2013-02-03: qty 1

## 2013-02-03 MED ORDER — ONDANSETRON HCL 4 MG/2ML IJ SOLN
4.0000 mg | Freq: Once | INTRAMUSCULAR | Status: AC
  Administered 2013-02-03: 4 mg via INTRAVENOUS
  Filled 2013-02-03: qty 2

## 2013-02-03 MED ORDER — ONDANSETRON 8 MG PO TBDP: ORAL_TABLET | ORAL | Status: DC

## 2013-02-03 MED ORDER — KETOROLAC TROMETHAMINE 30 MG/ML IJ SOLN
30.0000 mg | Freq: Once | INTRAMUSCULAR | Status: AC
  Administered 2013-02-03: 30 mg via INTRAVENOUS
  Filled 2013-02-03: qty 1

## 2013-02-03 MED ORDER — METOCLOPRAMIDE HCL 5 MG/ML IJ SOLN
10.0000 mg | Freq: Once | INTRAMUSCULAR | Status: AC
Start: 2013-02-03 — End: 2013-02-03
  Administered 2013-02-03: 10 mg via INTRAVENOUS
  Filled 2013-02-03: qty 2

## 2013-02-03 MED ORDER — SODIUM CHLORIDE 0.9 % IV BOLUS (SEPSIS)
1000.0000 mL | Freq: Once | INTRAVENOUS | Status: AC
  Administered 2013-02-03: 1000 mL via INTRAVENOUS

## 2013-02-03 MED ORDER — ONDANSETRON 4 MG PO TBDP
4.0000 mg | ORAL_TABLET | Freq: Once | ORAL | Status: AC
  Administered 2013-02-03: 4 mg via ORAL

## 2013-02-03 MED ORDER — ONDANSETRON 4 MG PO TBDP
ORAL_TABLET | ORAL | Status: AC
  Filled 2013-02-03: qty 1

## 2013-02-03 MED ORDER — IBUPROFEN 800 MG PO TABS
800.0000 mg | ORAL_TABLET | Freq: Once | ORAL | Status: AC
  Administered 2013-02-03: 800 mg via ORAL
  Filled 2013-02-03: qty 1

## 2013-02-03 MED ORDER — DIPHENHYDRAMINE HCL 50 MG/ML IJ SOLN
25.0000 mg | Freq: Once | INTRAMUSCULAR | Status: AC
  Administered 2013-02-03: 25 mg via INTRAVENOUS
  Filled 2013-02-03: qty 1

## 2013-02-03 NOTE — ED Notes (Signed)
Pt unable to void at this time.  Urine spec cup at bedside

## 2013-02-03 NOTE — ED Notes (Signed)
Pt is here with abdominal pain, vomiting, diarrhea, and body aches.  LMP;  01/20/13.  Pt reports lower back pain.

## 2013-02-03 NOTE — ED Provider Notes (Signed)
History     CSN: [id number]  Arrival date & time 02/03/13  1632   First MD Initiated Contact with Patient 02/03/13 2003      Chief Complaint  Patient presents with  . Abdominal Pain  . Emesis   HPI  History provided by patient and family. Patient is a 27 year old female with history of hypertension and diabetes who presents with weights of acute onset nausea, vomiting and diarrhea. Symptoms began early this morning and are associated with diffuse body aches and abdominal cramps. She also complains of a headache with chills and sweats. Patient attempted to use some Pepto-Bismol earlier in the day but reports vomiting that shortly after taking it. You have difficulty keeping down any food or fluids. She has not used any other medicines or treatment for symptoms. She was around a young family member who also had vomiting or diarrhea symptoms recently. She has no recent travel. No unusual or undercooked foods.    Past Medical History  Diagnosis Date  . Infected pilonidal cyst   . Anxiety     diagnosed in 2007  . Diabetes mellitus     type 2 - uncontrolled  . Hypertension     benign essential    History reviewed. No pertinent past surgical history.  No family history on file.  History  Substance Use Topics  . Smoking status: Never Smoker   . Smokeless tobacco: Not on file  . Alcohol Use: Yes     Comment: occassionally    OB History   Grav Para Term Preterm Abortions TAB SAB Ect Mult Living                  Review of Systems  Constitutional: Positive for chills and appetite change.  Respiratory: Negative for cough and shortness of breath.   Cardiovascular: Negative for chest pain.  Gastrointestinal: Positive for nausea, vomiting, abdominal pain and diarrhea. Negative for constipation and blood in stool.  Musculoskeletal: Positive for myalgias and back pain.  Neurological: Positive for headaches. Negative for dizziness and light-headedness.  All other systems reviewed  and are negative.    Allergies  Review of patient's allergies indicates no known allergies.  Home Medications   Current Outpatient Rx  Name  Route  Sig  Dispense  Refill  . fexofenadine (ALLEGRA) 180 MG tablet   Oral   Take 180 mg by mouth daily.           BP 135/90  Pulse 129  Temp(Src) 97.8 F (36.6 C) (Oral)  Resp 20  SpO2 98%  LMP 01/20/2013  Physical Exam  Nursing note and vitals reviewed. Constitutional: She is oriented to person, place, and time. She appears well-developed and well-nourished. No distress.  HENT:  Head: Normocephalic.  Cardiovascular: Regular rhythm.  Tachycardia present.   No murmur heard. Pulmonary/Chest: Effort normal and breath sounds normal. No respiratory distress. She has no wheezes.  Abdominal: Soft. She exhibits no distension. There is tenderness. There is no rebound, no guarding, no CVA tenderness, no tenderness at McBurney's point and negative Murphy's sign.  Mild diffuse tenderness  Musculoskeletal: Normal range of motion.  Neurological: She is alert and oriented to person, place, and time.  Skin: Skin is warm and dry. No rash noted.  Psychiatric: She has a normal mood and affect. Her behavior is normal.    ED Course  Procedures   Results for orders placed during the hospital encounter of 02/03/13  CBC WITH DIFFERENTIAL      Result Value  Range   WBC 11.8 (*) 4.0 - 10.5 K/uL   RBC 5.16 (*) 3.87 - 5.11 MIL/uL   Hemoglobin 13.3  12.0 - 15.0 g/dL   HCT 40.9  81.1 - 91.4 %   MCV 77.1 (*) 78.0 - 100.0 fL   MCH 25.8 (*) 26.0 - 34.0 pg   MCHC 33.4  30.0 - 36.0 g/dL   RDW 78.2  95.6 - 21.3 %   Platelets 293  150 - 400 K/uL   Neutrophils Relative 87 (*) 43 - 77 %   Neutro Abs 10.2 (*) 1.7 - 7.7 K/uL   Lymphocytes Relative 4 (*) 12 - 46 %   Lymphs Abs 0.4 (*) 0.7 - 4.0 K/uL   Monocytes Relative 9  3 - 12 %   Monocytes Absolute 1.1 (*) 0.1 - 1.0 K/uL   Eosinophils Relative 0  0 - 5 %   Eosinophils Absolute 0.0  0.0 - 0.7 K/uL    Basophils Relative 0  0 - 1 %   Basophils Absolute 0.0  0.0 - 0.1 K/uL  COMPREHENSIVE METABOLIC PANEL      Result Value Range   Sodium 134 (*) 135 - 145 mEq/L   Potassium 4.0  3.5 - 5.1 mEq/L   Chloride 98  96 - 112 mEq/L   CO2 24  19 - 32 mEq/L   Glucose, Bld 250 (*) 70 - 99 mg/dL   BUN 13  6 - 23 mg/dL   Creatinine, Ser 0.86  0.50 - 1.10 mg/dL   Calcium 9.5  8.4 - 57.8 mg/dL   Total Protein 8.1  6.0 - 8.3 g/dL   Albumin 4.6  3.5 - 5.2 g/dL   AST 12  0 - 37 U/L   ALT 10  0 - 35 U/L   Alkaline Phosphatase 83  39 - 117 U/L   Total Bilirubin 0.6  0.3 - 1.2 mg/dL   GFR calc non Af Amer >90  >90 mL/min   GFR calc Af Amer >90  >90 mL/min  LIPASE, BLOOD      Result Value Range   Lipase 16  11 - 59 U/L  URINALYSIS, ROUTINE W REFLEX MICROSCOPIC      Result Value Range   Color, Urine YELLOW  YELLOW   APPearance CLEAR  CLEAR   Specific Gravity, Urine 1.025  1.005 - 1.030   pH 6.0  5.0 - 8.0   Glucose, UA >1000 (*) NEGATIVE mg/dL   Hgb urine dipstick NEGATIVE  NEGATIVE   Bilirubin Urine NEGATIVE  NEGATIVE   Ketones, ur >80 (*) NEGATIVE mg/dL   Protein, ur NEGATIVE  NEGATIVE mg/dL   Urobilinogen, UA 0.2  0.0 - 1.0 mg/dL   Nitrite NEGATIVE  NEGATIVE   Leukocytes, UA NEGATIVE  NEGATIVE  GLUCOSE, CAPILLARY      Result Value Range   Glucose-Capillary 239 (*) 70 - 99 mg/dL   Comment 1 Documented in Chart     Comment 2 Notify RN    URINE MICROSCOPIC-ADD ON      Result Value Range   Squamous Epithelial / LPF RARE  RARE   WBC, UA 0-2  <3 WBC/hpf   Bacteria, UA RARE  RARE  GLUCOSE, CAPILLARY      Result Value Range   Glucose-Capillary 167 (*) 70 - 99 mg/dL  POCT PREGNANCY, URINE      Result Value Range   Preg Test, Ur NEGATIVE  NEGATIVE   Anion gap equals 12     1. Nausea vomiting  and diarrhea   2. Viral syndrome       MDM  8:25 PM patient seen and evaluated. Patient resting calmly in no apparent distress. She does not appear in any significant discomfort. She is not  appear severely ill or toxic.  Patient with some hyperglycemia. No anion gap. Fluids ordered. Patient does appear slightly dehydrated. Also slightly tachycardic.  Symptoms consistent with viral GI process. If she was near young child with similar symptoms.  Patient continues to do well. Headache has resolved completely. Heart rate improving with fluids patient appears well. Blood sugar also improved. She is tolerating by mouth fluids. At this time suspect symptoms cause by viral infection. No other concerning findings this time. Patient stable for discharge home.    Angus Seller, PA-C 02/03/13 2340

## 2013-02-03 NOTE — ED Notes (Signed)
The pts headache is gone. She remains alert family at the bedside.  ivs finished

## 2013-02-04 NOTE — ED Provider Notes (Signed)
Medical screening examination/treatment/procedure(s) were performed by non-physician practitioner and as supervising physician I was immediately available for consultation/collaboration.   Sara [name]. Ardyn Forge, MD 02/04/13 2314

## 2013-02-04 NOTE — ED Notes (Signed)
Iv not documented.  removed

## 2013-08-10 ENCOUNTER — Emergency Department (HOSPITAL_COMMUNITY): Payer: PRIVATE HEALTH INSURANCE

## 2013-08-10 ENCOUNTER — Encounter (HOSPITAL_COMMUNITY): Payer: Self-pay | Admitting: Emergency Medicine

## 2013-08-10 ENCOUNTER — Emergency Department (HOSPITAL_COMMUNITY)
Admission: EM | Admit: 2013-08-10 | Discharge: 2013-08-10 | Disposition: A | Discharge status: Home / Self Care | Payer: PRIVATE HEALTH INSURANCE | Attending: Emergency Medicine | Admitting: Emergency Medicine

## 2013-08-10 DIAGNOSIS — Z872 Personal history of diseases of the skin and subcutaneous tissue: Secondary | ICD-10-CM | POA: Insufficient documentation

## 2013-08-10 DIAGNOSIS — I10 Essential (primary) hypertension: Secondary | ICD-10-CM | POA: Insufficient documentation

## 2013-08-10 DIAGNOSIS — M79609 Pain in unspecified limb: Secondary | ICD-10-CM | POA: Insufficient documentation

## 2013-08-10 DIAGNOSIS — Z79899 Other long term (current) drug therapy: Secondary | ICD-10-CM | POA: Insufficient documentation

## 2013-08-10 DIAGNOSIS — IMO0001 Reserved for inherently not codable concepts without codable children: Secondary | ICD-10-CM | POA: Insufficient documentation

## 2013-08-10 DIAGNOSIS — M79674 Pain in right toe(s): Secondary | ICD-10-CM

## 2013-08-10 DIAGNOSIS — Z8659 Personal history of other mental and behavioral disorders: Secondary | ICD-10-CM | POA: Insufficient documentation

## 2013-08-10 MED ORDER — IBUPROFEN 800 MG PO TABS: 800.0000 mg | ORAL_TABLET | Freq: Three times a day (TID) | ORAL | Status: DC

## 2013-08-10 NOTE — ED Provider Notes (Signed)
CSN: [id number]     Arrival date & time 08/10/13  1109 History  This chart was scribed for non-physician practitioner Sharilyn Sites, PA-C, working with Bonnita Levan. Bernette Mayers, MD by Dorothey Baseman, ED Scribe. This patient was seen in room TR05C/TR05C and the patient's care was started at 12:04 PM.    Chief Complaint  Patient presents with  . Toe Pain   The history is provided by the patient. No language interpreter was used.   HPI Comments: Sara [name] is a 27 y.o. female who presents to the Emergency Department complaining of a constant, throbbing pain to the right big toe onset last night secondary to dropping a can of whipped cream  on the toe at the base of the nail.  No numbness or paresthesias of toe or foot.  Has not been able to put on a normal shoe due to pain.  Pain worse with weight bearing and ambulation.  Taken tylenol without relief.  Past Medical History  Diagnosis Date  . Infected pilonidal cyst   . Anxiety     diagnosed in 2007  . Diabetes mellitus     type 2 - uncontrolled  . Hypertension     benign essential   No past surgical history on file. No family history on file. History  Substance Use Topics  . Smoking status: Never Smoker   . Smokeless tobacco: Not on file  . Alcohol Use: Yes     Comment: occassionally   OB History   Grav Para Term Preterm Abortions TAB SAB Ect Mult Living                 Review of Systems  Musculoskeletal: Positive for arthralgias. Negative for joint swelling.  All other systems reviewed and are negative.    Allergies  Review of patient's allergies indicates no known allergies.  Home Medications   Current Outpatient Rx  Name  Route  Sig  Dispense  Refill  . fexofenadine (ALLEGRA) 180 MG tablet   Oral   Take 180 mg by mouth daily.         . ondansetron (ZOFRAN ODT) 8 MG disintegrating tablet      8mg  ODT q4 hours prn nausea   20 tablet   0    Triage Vitals: BP 163/87  Pulse 94  Temp(Src) 97.9 F (36.6 C)  (Oral)  Resp 16  SpO2 99%  Physical Exam  Nursing note and vitals reviewed. Constitutional: She is oriented to person, place, and time. She appears well-developed and well-nourished.  HENT:  Head: Normocephalic and atraumatic.  Eyes: Conjunctivae and EOM are normal.  Neck: Normal range of motion.  Cardiovascular: Normal rate, regular rhythm and normal heart sounds.   Pulmonary/Chest: Effort normal and breath sounds normal. No respiratory distress.  Abdominal: Soft. She exhibits no distension.  Musculoskeletal: Normal range of motion.       Feet:  Right great toe with pain at base of nail. Nail intact without deformity.  No laceration, paronychia, or signs of infection. Full flexion and extension with some pain. Strong capillary refill. Sensation intact.  Neurological: She is alert and oriented to person, place, and time.  Distal sensation is intact.  Skin: Skin is warm and dry.  Psychiatric: She has a normal mood and affect.    ED Course  Procedures (including critical care time)  DIAGNOSTIC STUDIES: Oxygen Saturation is 99% on room air, normal by my interpretation.    COORDINATION OF CARE: 11:23 AM- Discussed negative x-ray results  and that there are no signs of infection. Advised patient to wear open-toed shoes until symptoms subside. Will discharge patient with a post-op shoe and ibuprofen. Discussed treatment plan with patient at bedside and patient verbalized agreement.     Labs Review Labs Reviewed - No data to display  Imaging Review Dg Toe Great Right  08/10/2013   CLINICAL DATA:  Injury to right great toe.  EXAM: RIGHT GREAT TOE  COMPARISON:  None.  FINDINGS: There is no evidence of fracture or dislocation. There is no evidence of arthropathy or other focal bone abnormality. Soft tissues are unremarkable.  IMPRESSION: Negative.   Electronically Signed   By: Amie Portland M.D.   On: 08/10/2013 11:59    EKG Interpretation   None       MDM   1. Great toe  pain, right    X-rays negative for acute fracture or dislocation. Patient given postop shoe. Rx Motrin. She will followup with: Clinic if problems occur. Discussed plan with patient, she agreed. Return precautions advised.   I personally performed the services described in this documentation, which was scribed in my presence. The recorded information has been reviewed and is accurate.    Garlon Hatchet, PA-C 08/10/13 1345

## 2013-08-10 NOTE — ED Notes (Signed)
Pt from home, c/o right great toe pain. Pt states she dropped a can on toe at base of nail.

## 2013-08-12 NOTE — ED Provider Notes (Signed)
Medical screening examination/treatment/procedure(s) were performed by non-physician practitioner and as supervising physician I was immediately available for consultation/collaboration.   Charles B. Bernette Mayers, MD 08/12/13 1225

## 2013-09-04 ENCOUNTER — Other Ambulatory Visit: Payer: Self-pay

## 2014-07-31 ENCOUNTER — Telehealth (HOSPITAL_COMMUNITY): Payer: Self-pay | Admitting: Family Medicine

## 2014-07-31 ENCOUNTER — Encounter (HOSPITAL_COMMUNITY): Payer: Self-pay | Admitting: Emergency Medicine

## 2014-07-31 ENCOUNTER — Emergency Department (INDEPENDENT_AMBULATORY_CARE_PROVIDER_SITE_OTHER)
Admission: EM | Admit: 2014-07-31 | Discharge: 2014-07-31 | Disposition: A | Discharge status: Home / Self Care | Payer: BC Managed Care – PPO | Source: Home / Self Care | Attending: Family Medicine | Admitting: Family Medicine

## 2014-07-31 DIAGNOSIS — L739 Follicular disorder, unspecified: Secondary | ICD-10-CM

## 2014-07-31 DIAGNOSIS — E119 Type 2 diabetes mellitus without complications: Secondary | ICD-10-CM

## 2014-07-31 DIAGNOSIS — Z794 Long term (current) use of insulin: Secondary | ICD-10-CM

## 2014-07-31 MED ORDER — FLUCONAZOLE 150 MG PO TABS: 150.0000 mg | ORAL_TABLET | Freq: Once | ORAL | Status: DC

## 2014-07-31 MED ORDER — MUPIROCIN 2 % EX OINT: 1.0000 "application " | TOPICAL_OINTMENT | Freq: Two times a day (BID) | CUTANEOUS | Status: DC

## 2014-07-31 MED ORDER — SULFAMETHOXAZOLE-TRIMETHOPRIM 800-160 MG PO TABS: 2.0000 | ORAL_TABLET | Freq: Two times a day (BID) | ORAL | Status: DC

## 2014-07-31 NOTE — ED Provider Notes (Signed)
Sara [name] is a 28 y.o. female who presents to Urgent Care today for rash. Patient has a one-day history of small painful rash on her forehead. She has a dime-sized patch of pustules in her central forward scalp. No fevers or chills nausea vomiting or diarrhea. No significant radiating pain. No other rash. No treatments tried yet. Symptoms are moderate.   Past Medical History  Diagnosis Date  . Infected pilonidal cyst   . Anxiety     diagnosed in 2007  . Diabetes mellitus     type 2 - uncontrolled  . Hypertension     benign essential   History  Substance Use Topics  . Smoking status: Never Smoker   . Smokeless tobacco: Not on file  . Alcohol Use: Yes     Comment: occassionally   ROS as above Medications: No current facility-administered medications for this encounter.   Current Outpatient Prescriptions  Medication Sig Dispense Refill  . acetaminophen (TYLENOL) 500 MG tablet Take 1,000 mg by mouth every 6 (six) hours as needed for pain.      Marland Kitchen ibuprofen (ADVIL,MOTRIN) 800 MG tablet Take 1 tablet (800 mg total) by mouth 3 (three) times daily.  21 tablet  0  . mupirocin ointment (BACTROBAN) 2 % Apply 1 application topically 2 (two) times daily.  30 g  0  . sulfamethoxazole-trimethoprim (SEPTRA DS) 800-160 MG per tablet Take 2 tablets by mouth 2 (two) times daily.  28 tablet  0    Exam:  BP 129/87  Pulse 84  Temp(Src) 97.8 F (36.6 C) (Oral)  Resp 18  SpO2 99%  LMP 07/29/2014 Gen: Well NAD HEENT: EOMI,  MMM Lungs: Normal work of breathing. CTABL Heart: RRR no MRG Abd: NABS, Soft. Nondistended, Nontender Exts: Brisk capillary refill, warm and well perfused.  Skin: Small dime-sized erythematous-based grouped pustules forward scalp central. No other rash. Tender to touch.  No results found for this or any previous visit (from the past 24 hour(s)). No results found.  Assessment and Plan: 28 y.o. female with rash. Likely folliculitis. Shingles is a possibility but  location is central and patient does not have unilateral symptoms. Plan to treat with Septra and Bactroban. Watchful waiting. Followup as needed.  Discussed warning signs or symptoms. Please see discharge instructions. Patient expresses understanding.     Gregor Hams, MD 07/31/14 [phone]

## 2014-07-31 NOTE — ED Notes (Signed)
C/o rash on scalp/forehead onset yest; size of a dime Itches and painful Denies inj/trauma, f/v/n/d Alert, no signs of acute distress.

## 2014-07-31 NOTE — Discharge Instructions (Signed)
Thank you for coming in today. Take Bactrim twice daily for one week. Make sure to use birth control taking this medication. Applymupirocin antibiotic ointment twice or 3 times daily.  Return if not improving or worsening.  Folliculitis  Folliculitis is redness, soreness, and swelling (inflammation) of the hair follicles. This condition can occur anywhere on the body. People with weakened immune systems, diabetes, or obesity have a greater risk of getting folliculitis. CAUSES  Bacterial infection. This is the most common cause.  Fungal infection.  Viral infection.  Contact with certain chemicals, especially oils and tars. Long-term folliculitis can result from bacteria that live in the nostrils. The bacteria may trigger multiple outbreaks of folliculitis over time. SYMPTOMS Folliculitis most commonly occurs on the scalp, thighs, legs, back, buttocks, and areas where hair is shaved frequently. An early sign of folliculitis is a small, white or yellow, pus-filled, itchy lesion (pustule). These lesions appear on a red, inflamed follicle. They are usually less than 0.2 inches (5 mm) wide. When there is an infection of the follicle that goes deeper, it becomes a boil or furuncle. A group of closely packed boils creates a larger lesion (carbuncle). Carbuncles tend to occur in hairy, sweaty areas of the body. DIAGNOSIS  Your caregiver can usually tell what is wrong by doing a physical exam. A sample may be taken from one of the lesions and tested in a lab. This can help determine what is causing your folliculitis. TREATMENT  Treatment may include:  Applying warm compresses to the affected areas.  Taking antibiotic medicines orally or applying them to the skin.  Draining the lesions if they contain a large amount of pus or fluid.  Laser hair removal for cases of long-lasting folliculitis. This helps to prevent regrowth of the hair. HOME CARE INSTRUCTIONS  Apply warm compresses to the  affected areas as directed by your caregiver.  If antibiotics are prescribed, take them as directed. Finish them even if you start to feel better.  You may take over-the-counter medicines to relieve itching.  Do not shave irritated skin.  Follow up with your caregiver as directed. SEEK IMMEDIATE MEDICAL CARE IF:   You have increasing redness, swelling, or pain in the affected area.  You have a fever. MAKE SURE YOU:  Understand these instructions.  Will watch your condition.  Will get help right away if you are not doing well or get worse. Document Released: 12/25/2001 Document Revised: 04/16/2012 Document Reviewed: 01/16/2012 [facility] Patient Information 2015 [place], [place]. This information is not intended to replace advice given to you by your health care provider. Make sure you discuss any questions you have with your health care provider.

## 2014-07-31 NOTE — ED Notes (Signed)
Patient typically gets yeast infections with antibiotics. I have called fluconazole for use as needed.  Gregor Hams, MD 07/31/14 1011

## 2014-08-07 ENCOUNTER — Encounter (HOSPITAL_COMMUNITY): Payer: Self-pay | Admitting: Emergency Medicine

## 2014-08-07 ENCOUNTER — Emergency Department (INDEPENDENT_AMBULATORY_CARE_PROVIDER_SITE_OTHER)
Admission: EM | Admit: 2014-08-07 | Discharge: 2014-08-07 | Disposition: A | Discharge status: Home / Self Care | Payer: BC Managed Care – PPO | Source: Home / Self Care | Attending: Family Medicine | Admitting: Family Medicine

## 2014-08-07 DIAGNOSIS — E119 Type 2 diabetes mellitus without complications: Secondary | ICD-10-CM

## 2014-08-07 DIAGNOSIS — T7840XA Allergy, unspecified, initial encounter: Secondary | ICD-10-CM

## 2014-08-07 DIAGNOSIS — L739 Follicular disorder, unspecified: Secondary | ICD-10-CM

## 2014-08-07 MED ORDER — METHYLPREDNISOLONE SODIUM SUCC 125 MG IJ SOLR
125.0000 mg | Freq: Once | INTRAMUSCULAR | Status: AC
  Administered 2014-08-07: 125 mg via INTRAMUSCULAR

## 2014-08-07 MED ORDER — METHYLPREDNISOLONE SODIUM SUCC 125 MG IJ SOLR
INTRAMUSCULAR | Status: AC
  Filled 2014-08-07: qty 2

## 2014-08-07 MED ORDER — PREDNISONE 50 MG PO TABS: ORAL_TABLET | ORAL | Status: DC

## 2014-08-07 NOTE — Discharge Instructions (Signed)
You are experiencing an allergic reaction - likely to the bactrim The infection appears to be gone so you can completely stop this medicine You were given a shot of steroid to help with the allergic reaction Please continue this therapy with prednisone 50mg  every morning with breakfast Please also use benadryl to help with the rash Please check your sugar regularly as it will go up while on prednisone If your sugar is above 200 than please increase your lantus by 3-5 units that night.  Please decrease your dose by 3-5 units if your glucose is below 100.  Please stop the steroid if your sugar are above 300 or feeling worse.

## 2014-08-07 NOTE — ED Notes (Signed)
Pt given Solu-Medrol injection.  Pt tolerated well.  Will stay for 15 minutes to observe for any adverse reactions.  Pt is sitting comfortably on the bed. A&O w/ VSS.

## 2014-08-07 NOTE — ED Provider Notes (Signed)
CSN: [id number]     Arrival date & time 08/07/14  1140 History   First MD Initiated Contact with Patient 08/07/14 1226     Chief Complaint  Patient presents with  . Allergic Reaction    antibiotic   (Consider location/radiation/quality/duration/timing/severity/associated sxs/prior Treatment) HPI  Rash: new rash since last appt where pt given bactrim for possible folliclulitis on head. That rash is improving. New rash started about 4-5 days after starting the antibiotic. Very itchy and keeps pt up at night. No changein soaps lotions or detergents or change in food selection. Rash is mostly on arms. Getting worse. Has not taken anything for the rash. Stopped bactrim 2 days ago due to difficulty swallowing the pills. No dysphagia or SOB or CP.   DM: poorly controlled per pt. Lantus 26 units QHS. Has tried several oral medications through PCP but has not tolerated them well.   Past Medical History  Diagnosis Date  . Infected pilonidal cyst   . Anxiety     diagnosed in 2007  . Diabetes mellitus     type 2 - uncontrolled  . Hypertension     benign essential   History reviewed. No pertinent past surgical history. History reviewed. No pertinent family history. History  Substance Use Topics  . Smoking status: Never Smoker   . Smokeless tobacco: Not on file  . Alcohol Use: Yes     Comment: occassionally   OB History   Grav Para Term Preterm Abortions TAB SAB Ect Mult Living                 Review of Systems Per HPI with all other pertinent systems negative.   Allergies  Lobster and Latex  Home Medications   Prior to Admission medications   Medication Sig Start Date End Date Taking? Authorizing Provider  insulin glargine (LANTUS) 100 UNIT/ML injection Inject into the skin at bedtime.   Yes Historical Provider, MD  lisinopril (PRINIVIL,ZESTRIL) 10 MG tablet Take 10 mg by mouth daily.   Yes Historical Provider, MD  sitaGLIPtin (JANUVIA) 50 MG tablet Take 50 mg by mouth daily.    Yes Historical Provider, MD  acetaminophen (TYLENOL) 500 MG tablet Take 1,000 mg by mouth every 6 (six) hours as needed for pain.    Historical Provider, MD  fluconazole (DIFLUCAN) 150 MG tablet Take 1 tablet (150 mg total) by mouth once. 07/31/14   Gregor Hams, MD  ibuprofen (ADVIL,MOTRIN) 800 MG tablet Take 1 tablet (800 mg total) by mouth 3 (three) times daily. 08/10/13   Larene Pickett, PA-C  mupirocin ointment (BACTROBAN) 2 % Apply 1 application topically 2 (two) times daily. 07/31/14   Gregor Hams, MD  predniSONE (DELTASONE) 50 MG tablet Take daily with breakfast 08/07/14   Waldemar Dickens, MD  sulfamethoxazole-trimethoprim (SEPTRA DS) 800-160 MG per tablet Take 2 tablets by mouth 2 (two) times daily. 07/31/14   Gregor Hams, MD   LMP 07/29/2014 Physical Exam  Constitutional: She is oriented to person, place, and time. She appears well-developed and well-nourished.  HENT:  Head: Normocephalic and atraumatic.  Eyes: EOM are normal. Pupils are equal, round, and reactive to light.  Neck: Normal range of motion.  Cardiovascular: Normal rate, normal heart sounds and intact distal pulses.   No murmur heard. Pulmonary/Chest: Effort normal and breath sounds normal.  Abdominal: Soft.  Musculoskeletal: Normal range of motion.  Neurological: She is alert and oriented to person, place, and time.  Skin: Skin is warm.  Diffuse macular papular rash on arms and back.   Psychiatric: She has a normal mood and affect. Her behavior is normal. Judgment and thought content normal.    ED Course  Procedures (including critical care time) Labs Review Labs Reviewed - No data to display  Imaging Review No results found.   MDM   1. Allergic reaction, initial encounter   2. Diabetes mellitus without complication   3. Folliculitis    Allergic reaction.  - stop the bactrim - solumedrol 125mg  IM given in office - start benadryl - prednisone 50 Qday x 4 more days  DM:  lantus titration schedule  given.  Pt aware of change in glucose levels while on adn when coming off prednisone and will adjust lantus accordingly.  Stop prednisone if over 300 and getting symptoms  Folliculitis: resolved from previous. 5 days of ABX were likely sufficieint.    Precautions given and all questions answered   Linna Darner, MD Family Medicine 08/07/2014, 12:49 PM      Waldemar Dickens, MD 08/07/14 1249

## 2014-08-08 ENCOUNTER — Telehealth (HOSPITAL_COMMUNITY): Payer: Self-pay | Admitting: Emergency Medicine

## 2014-08-08 MED ORDER — INSULIN PEN NEEDLE 31G X 6 MM MISC: Status: DC

## 2014-08-08 MED ORDER — INSULIN LISPRO 100 UNIT/ML (KWIKPEN): PEN_INJECTOR | SUBCUTANEOUS | Status: DC

## 2014-08-08 NOTE — ED Notes (Signed)
The patient calls in this evening after having been seen yesterday because of allergic reaction. She is a diabetic and was prescribed prednisone. She states she's no longer itching. She has been taking some Zyrtec as well. Her blood sugar is high this afternoon at 412. She is on Lantus insulin and Januvia. She is nothing short acting to get her sugar down. She was told to stop the prednisone, take Zantac for the itching, and we'll call in some Humalog for her to take 15 units with each meal. This would be in addition to her Lantus and her Januvia.  Harden Mo, MD 08/08/14 [phone]

## 2014-09-07 LAB — OB RESULTS CONSOLE RPR: RPR: NONREACTIVE

## 2014-09-07 LAB — OB RESULTS CONSOLE HEPATITIS B SURFACE ANTIGEN: HEP B S AG: NEGATIVE

## 2014-09-07 LAB — OB RESULTS CONSOLE GC/CHLAMYDIA
Chlamydia: NEGATIVE
Gonorrhea: NEGATIVE

## 2014-09-07 LAB — OB RESULTS CONSOLE RUBELLA ANTIBODY, IGM: Rubella: IMMUNE

## 2014-09-07 LAB — OB RESULTS CONSOLE ABO/RH: RH Type: POSITIVE

## 2014-09-07 LAB — OB RESULTS CONSOLE HIV ANTIBODY (ROUTINE TESTING): HIV: NONREACTIVE

## 2014-09-07 LAB — OB RESULTS CONSOLE ANTIBODY SCREEN: Antibody Screen: NEGATIVE

## 2014-09-11 ENCOUNTER — Other Ambulatory Visit: Payer: Self-pay | Admitting: Obstetrics and Gynecology

## 2014-10-01 ENCOUNTER — Ambulatory Visit (HOSPITAL_COMMUNITY)
Admission: RE | Admit: 2014-10-01 | Discharge: 2014-10-01 | Disposition: A | Discharge status: Home / Self Care | Payer: BC Managed Care – PPO | Source: Ambulatory Visit | Attending: Obstetrics & Gynecology | Admitting: Obstetrics & Gynecology

## 2014-10-01 ENCOUNTER — Encounter: Payer: BC Managed Care – PPO | Attending: Obstetrics & Gynecology | Admitting: *Deleted

## 2014-10-01 DIAGNOSIS — Z713 Dietary counseling and surveillance: Secondary | ICD-10-CM | POA: Diagnosis not present

## 2014-10-01 DIAGNOSIS — O24919 Unspecified diabetes mellitus in pregnancy, unspecified trimester: Principal | ICD-10-CM

## 2014-10-01 DIAGNOSIS — E139 Other specified diabetes mellitus without complications: Secondary | ICD-10-CM | POA: Insufficient documentation

## 2014-10-01 DIAGNOSIS — O24319 Unspecified pre-existing diabetes mellitus in pregnancy, unspecified trimester: Secondary | ICD-10-CM | POA: Diagnosis present

## 2014-10-01 DIAGNOSIS — Z794 Long term (current) use of insulin: Principal | ICD-10-CM

## 2014-10-01 DIAGNOSIS — IMO0001 Reserved for inherently not codable concepts without codable children: Secondary | ICD-10-CM

## 2014-10-01 NOTE — Progress Notes (Signed)
  Patient was seen on 10/01/14 for consult related to Diabetes self-management complicated by pregnancy. Sara [name] is presently taking Lantus Insulin 39unit HS and Januvia 168m po daily. Although Januvia is a Pregnancy Class B medication there is no supporting research in its use during pregnancy. In consult with Dr. DErnie Hewwe will discontinue the use of Januvia immediately. The patient will be testing her glucose as directed [facility]. At that time we will have concrete data in order to further evaluate treatment regimen. SCretais testing her glucose sporadically. Her current FBS readings 90-1049mdl and 2hpp 150's. I discussed with the patient the potential need for meal time insulin coverage. She is comfortable with this possibility.   The following learning objectives were met by the patient :   States why dietary management is important in controlling blood glucose  Describes the effects of carbohydrates on blood glucose levels  Demonstrates ability to create a balanced meal plan  Demonstrates carbohydrate counting   States when to check blood glucose levels  Demonstrates proper blood glucose monitoring techniques  States the effect of stress and exercise on blood glucose levels  Plan:  Aim for 2 Carb Choices per meal (30 grams) +/- 1 either way for breakfast Aim for 3 Carb Choices per meal (45 grams) +/- 1 either way from lunch and dinner Aim for 1-2 Carbs per snack Begin reading food labels for Total Carbohydrate and sugar grams of foods Consider  increasing your activity level by walking daily as tolerated Begin checking BG before breakfast and 2 hours after first bit of breakfast, lunch and dinner after  as directed by MD  Take medication  as directed by MD  Patient instructed to monitor glucose levels: FBS: 60 - <90 2 hour: <120  Patient received the following handouts:  Carbohydrate Counting List  Meal Planning  worksheet  Patient will be seen for follow-up as needed.

## 2014-10-02 ENCOUNTER — Ambulatory Visit (HOSPITAL_COMMUNITY): Payer: BC Managed Care – PPO

## 2014-10-02 ENCOUNTER — Other Ambulatory Visit (HOSPITAL_COMMUNITY): Payer: Self-pay

## 2014-10-06 ENCOUNTER — Ambulatory Visit (HOSPITAL_COMMUNITY)
Admission: RE | Admit: 2014-10-06 | Discharge: 2014-10-06 | Disposition: A | Discharge status: Home / Self Care | Payer: BC Managed Care – PPO | Source: Ambulatory Visit | Attending: Obstetrics & Gynecology | Admitting: Obstetrics & Gynecology

## 2014-10-06 ENCOUNTER — Encounter (HOSPITAL_COMMUNITY): Payer: Self-pay

## 2014-10-06 DIAGNOSIS — D473 Essential (hemorrhagic) thrombocythemia: Secondary | ICD-10-CM | POA: Diagnosis not present

## 2014-10-06 DIAGNOSIS — O10011 Pre-existing essential hypertension complicating pregnancy, first trimester: Secondary | ICD-10-CM | POA: Insufficient documentation

## 2014-10-06 DIAGNOSIS — Z794 Long term (current) use of insulin: Secondary | ICD-10-CM | POA: Insufficient documentation

## 2014-10-06 DIAGNOSIS — O24111 Pre-existing diabetes mellitus, type 2, in pregnancy, first trimester: Secondary | ICD-10-CM | POA: Insufficient documentation

## 2014-10-06 DIAGNOSIS — Z3A09 9 weeks gestation of pregnancy: Secondary | ICD-10-CM | POA: Insufficient documentation

## 2014-10-06 DIAGNOSIS — E1165 Type 2 diabetes mellitus with hyperglycemia: Secondary | ICD-10-CM | POA: Insufficient documentation

## 2014-10-06 NOTE — Consult Note (Signed)
Maternal Fetal Medicine Consultation  Requesting Provider(s): Archie Balboa, FNP-BC/ Carmon Sails, MD  Primary OB: [facility]  Reason for consultation: Pre gestational diabetes, thrombocytosis  HPI: Sara [name] is a 28 yo G3P0111 currently at 7w 6d who is seen for consultation due to pre gestational diabetes and thrombocytosis.  Ms. Safer reports that she was diagnosed with diabetes about 3 years ago following the delivery of her child - that pregnancy was complicated by gestational diabetes and testing post partum was consisted with diabetes.  The patient was previously on Januvia and Lantis.  Her Januvia was recently stopped after her visit with the diabetic counselor.  Her baseline Hb A1C was 8.1 percent.  The patient reports that she feels that it was somewhat elevated due to steroid use that she was taking due to a skin rash.  The patient denies any known end-organ disease.  Her last Optho evaluation was about a year ago.  She had a baseline 24 hr urine for protein and creatine clearance.  Ms. Seiter also reports a history of chronic hypertension.  She was previously on Lisinopril that was stopped prior to becoming pregnant and is currently not taking anything for blood pressure.  The patient's CBC with her new OB labs showed mildly a mildly elevated platelet count of 466K; repeat on 12/2 was 371K.  She denies any prior history of elevated platelets.  After stopping her Januvia, the patient's post prandial fingerstick values were elevated to the 200's.  She was recently started on Novolin R - her current insulin dose is 39 units of Lantis qHS and 8 units of Novolin R at breakfast and 10 units of Novolin R with dinner.  She is without complaints today.  OB History: OB History    Gravida Para Term Preterm AB TAB SAB Ectopic Multiple Living   3 1  1 1  1   1     Preterm delivery at 35 weeks - developed preeclampsia post partum.  Pregnancy complicated by GDM.  Had early  SAB.  PMH:  Past Medical History  Diagnosis Date  . Infected pilonidal cyst   . Anxiety     diagnosed in 2007  . Diabetes mellitus     type 2 - uncontrolled  . Hypertension     benign essential  . Headache     PSH:  Past Surgical History  Procedure Laterality Date  . No past surgeries     Meds:  Lantis 39 units qHS, Novolin R 8 units with breakfast and 10 units with dinner              Prenatal vitamins  Allergies:  Allergies  Allergen Reactions  . Septra Ds [Sulfamethoxazole-Trimethoprim] Hives and Itching  . Lobster [Shellfish Allergy] Itching    Throat itching  . Latex Rash   FH: Denies family history of birth defects or hereditary disorders.  Mother, paternal aunt, grandparents - DM; Chronic hypertension - father, grandfather  Soc:  History   Social History  . Marital Status: Married    Spouse Name: N/A    Number of Children: N/A  . Years of Education: N/A   Occupational History  . Not on file.   Social History Main Topics  . Smoking status: Never Smoker   . Smokeless tobacco: Not on file  . Alcohol Use: Yes     Comment: occassionally  . Drug Use: No  . Sexual Activity: Yes    Birth Control/ Protection: IUD   Other Topics  Concern  . Not on file   Social History Narrative    A/P: 1) Single IUP at 9w 6d         2) Type 2 diabetes, insulin requiring - baseline HbA1C 8.1%.  Had brief discussion regarding potential risks to the fetus to include congenital birth defects, fetal macrosomia, risk of stillbirth if poorly controlled, risk of birth injury and cesarean delivery.  Fingerstick values reviewed - continues to have some elevated post-prandial values, but overall well controlled.         3) Thrombocytosis - do not feel that the isolated elevated platelet count of 476 is of any clinical significance.  Repeat was within normal limits.  May consider repeating each trimester - would consider hematology consultation if plts persistently > 500K.         4)  Hx of Chronic hypertension - borderline values in clinic today.  Would have low threshold to start antihypertensive medications if SBP persistently > 140 or DBP persistently > 90.  My choice of medication would be either Procardia XL or Labetalol.  Recommendations: 1) Recommend switching insulin to Novolog with meals.  New insulin dose: 39 units Lantis qHS and Novolog 8 units with each meal.  The patient will continue to check 4x daily fingerstick values.  She will need close follow up at least every 2 weeks for OB visits initially and adjustments of her insulin dose to maintain fasting values in the 60-90 range; 2-hr post prandial values < 130.  The patient has our contact information if she has fingerstick values < 60 or > 150. 2) Detailed ultrasound for anatomy at 18-20 weeks.  Please contact our office if you would prefer that this be scheduled with MFM. 3) Fetal echo at 20-[redacted] weeks gestation. 4) Serial growth scans in the 3rd trimester. 5) Antepartum fetal testing beginning at 32 weeks 6) Delivery at 39 weeks in the absence of other complications.   Thank you for the opportunity to be a part of the care of Sara [name]. Please contact our office if we can be of further assistance.   I spent approximately 30 minutes with this patient with over 50% of time spent in face-to-face counseling.  Benjaman Lobe, MD Maternal Fetal Medicine

## 2014-10-06 NOTE — ED Notes (Signed)
Prescription for Novolog vial called to [facility] per Dr. Lisbeth Renshaw.  Pt to take 8u with each meal..

## 2014-10-07 ENCOUNTER — Other Ambulatory Visit (HOSPITAL_COMMUNITY): Payer: Self-pay

## 2014-10-30 NOTE — L&D Delivery Note (Addendum)
I was called to see pt of [facility] who is 35 weeks in the second stage of labor.  The FHR showed a prolonged deceleration.  After assessing the patients pelvic I obtained verbal consent and placed a flat Kiwi vacuum extractor. The fetus was delivered over an intact perineum with 1 pull.  There were no complications.  The placenta was delivered by the midwife.   Sara [name], M.D., [place]       Final Labor Progress Note At 2952 pt reports an increased in rectal pressure.  FHR remained reassuring with occasional variable decelerations.  Vaginal Delivery Note The pt utilized an epidural as pain management.   Spontaneous rupture of membranes today, at 0014, clear.  GBS was negative.  Cervical dilation was complete at  1125.  NICHD Category 2.    Pushing with guidance began at  1126.  Prolong decel noted.  Dr Marolyn Hammock notified, DrCarolyn L. [name] called to the room, Peds called for delivery.   After 10 minutes of pushing the head, shoulders and the body of a viable female infant "Sara [name]" delivered with vacuum assistance (see Dr Hoyle Sauer L. [name]'s note above) and maternal effort in the ROA position at 1136.   With vigorous tone and spontaneous cry, the infant was placed on moms abd.  After the umbilical cord was clamped it was cut by the FOB, the infant was handed to the NICU team for immediate assessment .    Spontaneous delivery of a intact placenta with a 3 vessel cord via Shultz at  1145.   Episiotomy: None   The vulva, perineum, vaginal vault, rectum and cervix were inspected no repairs needed.  Postpartum pitocin as ordered.  Fundus firm, lochia minimum, bleeding under control.  EBL 50, Pt hemodynamically stable.   Sponge, laps and needle count correct and verified with the primary care nurse.  Attending MD available at all times.    Routine postpartum orders   Mother desires Nexplanon for contraception   Infant to have out patient circumcision   Placenta  to pathology: NO     Cord Gases sent to lab: NO Cord blood sent to lab: YES   APGARS:  8 at 1 minute and 9 at 5 minutes Weight:. 5lb 12.8oz     Both mom and baby were left in stable condition      Sara [name], CNM, MSN 04/01/2015. 12:14 PM

## 2014-12-04 ENCOUNTER — Encounter (HOSPITAL_COMMUNITY): Payer: Self-pay | Admitting: *Deleted

## 2014-12-04 ENCOUNTER — Inpatient Hospital Stay (HOSPITAL_COMMUNITY)
Admission: AD | Admit: 2014-12-04 | Discharge: 2014-12-05 | Disposition: A | Discharge status: Home / Self Care | Payer: BLUE CROSS/BLUE SHIELD | Source: Ambulatory Visit | Attending: Obstetrics and Gynecology | Admitting: Obstetrics and Gynecology

## 2014-12-04 DIAGNOSIS — Z794 Long term (current) use of insulin: Secondary | ICD-10-CM | POA: Diagnosis not present

## 2014-12-04 DIAGNOSIS — E119 Type 2 diabetes mellitus without complications: Secondary | ICD-10-CM | POA: Insufficient documentation

## 2014-12-04 DIAGNOSIS — O132 Gestational [pregnancy-induced] hypertension without significant proteinuria, second trimester: Secondary | ICD-10-CM | POA: Diagnosis not present

## 2014-12-04 DIAGNOSIS — Z8679 Personal history of other diseases of the circulatory system: Secondary | ICD-10-CM

## 2014-12-04 DIAGNOSIS — F411 Generalized anxiety disorder: Secondary | ICD-10-CM

## 2014-12-04 DIAGNOSIS — Z3A18 18 weeks gestation of pregnancy: Secondary | ICD-10-CM | POA: Diagnosis not present

## 2014-12-04 DIAGNOSIS — O24112 Pre-existing diabetes mellitus, type 2, in pregnancy, second trimester: Secondary | ICD-10-CM | POA: Diagnosis not present

## 2014-12-04 DIAGNOSIS — Z8669 Personal history of other diseases of the nervous system and sense organs: Secondary | ICD-10-CM

## 2014-12-04 DIAGNOSIS — D649 Anemia, unspecified: Secondary | ICD-10-CM

## 2014-12-04 DIAGNOSIS — O09899 Supervision of other high risk pregnancies, unspecified trimester: Secondary | ICD-10-CM

## 2014-12-04 DIAGNOSIS — G43909 Migraine, unspecified, not intractable, without status migrainosus: Secondary | ICD-10-CM | POA: Diagnosis present

## 2014-12-04 DIAGNOSIS — O09219 Supervision of pregnancy with history of pre-term labor, unspecified trimester: Secondary | ICD-10-CM

## 2014-12-04 LAB — URINALYSIS, ROUTINE W REFLEX MICROSCOPIC
Bilirubin Urine: NEGATIVE
Glucose, UA: >1000 mg/dL — AB
HGB URINE DIPSTICK: NEGATIVE
KETONES UR: NEGATIVE mg/dL
Leukocytes, UA: NEGATIVE
NITRITE: NEGATIVE
PH: 6.5 (ref 5.0–8.0)
Protein, ur: NEGATIVE mg/dL
Specific Gravity, Urine: 1.02 (ref 1.005–1.030)
UROBILINOGEN UA: 0.2 mg/dL (ref 0.0–1.0)

## 2014-12-04 LAB — COMPREHENSIVE METABOLIC PANEL
ALT: 13 U/L (ref 0–35)
AST: 13 U/L (ref 0–37)
Albumin: 3.4 g/dL — ABNORMAL LOW (ref 3.5–5.2)
Alkaline Phosphatase: 47 U/L (ref 39–117)
Anion gap: 3 — ABNORMAL LOW (ref 5–15)
BILIRUBIN TOTAL: 0.4 mg/dL (ref 0.3–1.2)
BUN: 8 mg/dL (ref 6–23)
CO2: 23 mmol/L (ref 19–32)
CREATININE: 0.56 mg/dL (ref 0.50–1.10)
Calcium: 8.8 mg/dL (ref 8.4–10.5)
Chloride: 107 mmol/L (ref 96–112)
GFR calc non Af Amer: >90 mL/min (ref >90)
Glucose, Bld: 174 mg/dL — ABNORMAL HIGH (ref 70–99)
POTASSIUM: 4.3 mmol/L (ref 3.5–5.1)
Sodium: 133 mmol/L — ABNORMAL LOW (ref 135–145)
Total Protein: 6.1 g/dL (ref 6.0–8.3)

## 2014-12-04 LAB — URINE MICROSCOPIC-ADD ON

## 2014-12-04 LAB — PROTEIN / CREATININE RATIO, URINE: CREATININE, URINE: 94 mg/dL

## 2014-12-04 LAB — CBC
HEMATOCRIT: 32.7 % — AB (ref 36.0–46.0)
Hemoglobin: 10.5 g/dL — ABNORMAL LOW (ref 12.0–15.0)
MCH: 25.9 pg — AB (ref 26.0–34.0)
MCHC: 32.1 g/dL (ref 30.0–36.0)
MCV: 80.7 fL (ref 78.0–100.0)
PLATELETS: 278 10*3/uL (ref 150–400)
RBC: 4.05 MIL/uL (ref 3.87–5.11)
RDW: 13.4 % (ref 11.5–15.5)
WBC: 17.5 10*3/uL — ABNORMAL HIGH (ref 4.0–10.5)

## 2014-12-04 LAB — LACTATE DEHYDROGENASE: LDH: 117 U/L (ref 94–250)

## 2014-12-04 LAB — URIC ACID: URIC ACID, SERUM: 3.2 mg/dL (ref 2.4–7.0)

## 2014-12-04 NOTE — MAU Note (Signed)
Pt presents to MAU with headache that started at 11am.  She states she checked her BP at home and it was 150s/90s so she wanted to come and get checked out.

## 2014-12-04 NOTE — MAU Provider Note (Signed)
History  Sara [name]. Svehla is a 29 yo G3P0111 @ 18.2 wks by sure LMP c/w 6.3 wk scan (EDD 05/05/2015) presents to MAU after calling w/ c/o migraine headache unresolved w/ prescribed medication. States headache started around 11 am and became progressively worse throughout the day. Last Tylenol #3 taken at 8 PM. She reports that her pain was 8/10 before the 8 pm dose, 7/10 post medication and 6/10 while in MAU. States headache is frontal with no radiation to sinuses. Denies visual disturbances, RUQ pain, CP, SOB, N/V, weakness, cough, fever, chills or rhinorrhea. Reports quickening. Denies cramping, VB or change in discharge.  Pt has pre-gestational diabetes. Takes 8-10 units of Novalog before main meals and 3 units before snacks. Last took Novalog after lunch today. Reports also taking Lantis 39 units @ HS. States did not take Lantis tonight because she drank a coke w/ caffeine to help with her headache.  Gives h/o PTB at 35 wks due to elevated BPs. States developed preeclampsia within 24-48 hrs pp, was readmitted and started on Magnesium Sulfate x 24 hrs. Labetalol was prescribed and discontinued after a few weeks. Has not required any medication this pregnancy. A baseline preeclampsia w/u was done in the office to include a 24 hr urine. Results normal. Declined 17 P.  Patient Active Problem List   Diagnosis Date Noted  . History of preterm delivery, currently pregnant 12/05/2014  . History of migraine headaches 12/05/2014  . Generalized anxiety disorder 12/05/2014  . Anemia 12/05/2014  . History of high blood pressure 12/04/2014  . Type 2 diabetes mellitus without complication 79/89/2119  . Pilonidal abscess 09/30/2012    Chief Complaint  Patient presents with  . Headache   HPI As above OB History    Gravida Para Term Preterm AB TAB SAB Ectopic Multiple Living   3 1  1 1  1   1       Past Medical History  Diagnosis Date  . Infected pilonidal cyst   . Anxiety     diagnosed in  2007  . Diabetes mellitus     type 2 - uncontrolled  . Hypertension     benign essential  . Headache     Past Surgical History  Procedure Laterality Date  . No past surgeries      History reviewed. No pertinent family history.  History  Substance Use Topics  . Smoking status: Never Smoker   . Smokeless tobacco: Not on file  . Alcohol Use: No     Comment: occassionally    Allergies:  Allergies  Allergen Reactions  . Septra Ds [Sulfamethoxazole-Trimethoprim] Hives and Itching  . Lobster [Shellfish Allergy] Itching    Throat itching  . Latex Rash    No prescriptions prior to admission    ROS  Headache Physical Exam   Results for orders placed or performed during the hospital encounter of 12/04/14 (from the past 24 hour(s))  Urinalysis, Routine w reflex microscopic     Status: Abnormal   Collection Time: 12/04/14  9:55 PM  Result Value Ref Range   Color, Urine YELLOW YELLOW   APPearance CLEAR CLEAR   Specific Gravity, Urine 1.020 1.005 - 1.030   pH 6.5 5.0 - 8.0   Glucose, UA >1000 (A) NEGATIVE mg/dL   Hgb urine dipstick NEGATIVE NEGATIVE   Bilirubin Urine NEGATIVE NEGATIVE   Ketones, ur NEGATIVE NEGATIVE mg/dL   Protein, ur NEGATIVE NEGATIVE mg/dL   Urobilinogen, UA 0.2 0.0 - 1.0 mg/dL   Nitrite NEGATIVE  NEGATIVE   Leukocytes, UA NEGATIVE NEGATIVE  Urine microscopic-add on     Status: None   Collection Time: 12/04/14  9:55 PM  Result Value Ref Range   Squamous Epithelial / LPF RARE RARE   WBC, UA 0-2 <3 WBC/hpf   RBC / HPF 3-6 <3 RBC/hpf   Bacteria, UA RARE RARE  Protein / creatinine ratio, urine     Status: None   Collection Time: 12/04/14  9:55 PM  Result Value Ref Range   Creatinine, Urine 94.00 mg/dL   Total Protein, Urine <6 mg/dL   Protein Creatinine Ratio        0.00 - 0.15  CBC     Status: Abnormal   Collection Time: 12/04/14 10:55 PM  Result Value Ref Range   WBC 17.5 (H) 4.0 - 10.5 K/uL   RBC 4.05 3.87 - 5.11 MIL/uL   Hemoglobin 10.5 (L)  12.0 - 15.0 g/dL   HCT 32.7 (L) 36.0 - 46.0 %   MCV 80.7 78.0 - 100.0 fL   MCH 25.9 (L) 26.0 - 34.0 pg   MCHC 32.1 30.0 - 36.0 g/dL   RDW 13.4 11.5 - 15.5 %   Platelets 278 150 - 400 K/uL  Comprehensive metabolic panel     Status: Abnormal   Collection Time: 12/04/14 10:55 PM  Result Value Ref Range   Sodium 133 (L) 135 - 145 mmol/L   Potassium 4.3 3.5 - 5.1 mmol/L   Chloride 107 96 - 112 mmol/L   CO2 23 19 - 32 mmol/L   Glucose, Bld 174 (H) 70 - 99 mg/dL   BUN 8 6 - 23 mg/dL   Creatinine, Ser 0.56 0.50 - 1.10 mg/dL   Calcium 8.8 8.4 - 10.5 mg/dL   Total Protein 6.1 6.0 - 8.3 g/dL   Albumin 3.4 (L) 3.5 - 5.2 g/dL   AST 13 0 - 37 U/L   ALT 13 0 - 35 U/L   Alkaline Phosphatase 47 39 - 117 U/L   Total Bilirubin 0.4 0.3 - 1.2 mg/dL   GFR calc non Af Amer >90 >90 mL/min   GFR calc Af Amer >90 >90 mL/min   Anion gap 3 (L) 5 - 15  Lactate dehydrogenase     Status: None   Collection Time: 12/04/14 10:55 PM  Result Value Ref Range   LDH 117 94 - 250 U/L  Uric acid     Status: None   Collection Time: 12/04/14 10:55 PM  Result Value Ref Range   Uric Acid, Serum 3.2 2.4 - 7.0 mg/dL    Blood pressure 125/76, pulse 90, temperature 97.5 F (36.4 C), temperature source Oral, resp. rate 16, height 5' 2.5" (1.588 m), weight 187 lb 6.4 oz (85.004 kg), last menstrual period 07/29/2014, SpO2 100 %. Today's Vitals   12/04/14 2235 12/04/14 2330 12/05/14 0009 12/05/14 0049  BP: 145/90 137/88 123/71 125/76  Pulse: 102 98 90 90  Temp:      TempSrc:      Resp:      Height:      Weight:      SpO2:      PainSc:   0-No pain    BPs range 123-148/71-96  Physical Exam Gen: NAD Non-tender maxillary sinuses Lungs: CTAB CV: No murmur, gallop rhythms or adventitious sounds Abdomen: gravid, soft, no fundal tenderness Pelvic: Deferred Ext: 2+ DTRs bilaterally, no edema, no clonus Doptones: 155 bpm ED Course  UA Preeclampsia labs Serial BPs  Assessment: 29 yo [id number] @  18.2 wks GHTN  versus untreated CHTN Type 2 DM; on insulin   Plan: Preeclampsia labs Consult Dr. Lorrin Jackson, [facility] CNM, MS 12/05/2014 1:36 AM  ADDENDUM: Preeclampsia labs WNL. Leukocytosis (17.5) present w/o sxs of infection. WBC on 11/24/14 = 17.0. Per Dr. Mancel Bale, Labetalol 100 mg bid.  Late SAB precautions.  Keep office appt on 12/09/14. Insulin as instructed/prescribed.  Farrel Gordon, CNM 12/05/14, 12:55 AM

## 2014-12-05 DIAGNOSIS — D649 Anemia, unspecified: Secondary | ICD-10-CM

## 2014-12-05 DIAGNOSIS — O09219 Supervision of pregnancy with history of pre-term labor, unspecified trimester: Secondary | ICD-10-CM

## 2014-12-05 DIAGNOSIS — F411 Generalized anxiety disorder: Secondary | ICD-10-CM

## 2014-12-05 DIAGNOSIS — O09899 Supervision of other high risk pregnancies, unspecified trimester: Secondary | ICD-10-CM

## 2014-12-05 MED ORDER — LABETALOL HCL 100 MG PO TABS: 100.0000 mg | ORAL_TABLET | Freq: Two times a day (BID) | ORAL | Status: DC

## 2014-12-05 NOTE — Discharge Instructions (Signed)
Hypertension During Pregnancy Hypertension is also called high blood pressure. Blood pressure moves blood in your body. Sometimes, the force that moves the blood becomes too strong. When you are pregnant, this condition should be watched carefully. It can cause problems for you and your baby. HOME CARE   Make and keep all of your doctor visits.  Take medicine as told by your doctor. Tell your doctor about all medicines you take.  Eat very little salt.  Exercise regularly.  Do not drink alcohol.  Do not smoke.  Do not have drinks with caffeine.  Lie on your left side when resting.  Your health care provider may ask you to take one low-dose aspirin (81mg ) each day. GET HELP RIGHT AWAY IF:  You have bad belly (abdominal) pain.  You have sudden puffiness (swelling) in the hands, ankles, or face.  You gain 4 pounds (1.8 kilograms) or more in 1 week.  You throw up (vomit) repeatedly.  You have bleeding from the vagina.  You do not feel the baby moving as much.  You have a headache.  You have blurred or double vision.  You have muscle twitching or spasms.  You have shortness of breath.  You have blue fingernails and lips.  You have blood in your pee (urine). MAKE SURE YOU:  Understand these instructions.  Will watch your condition.  Will get help right away if you are not doing well or get worse. Document Released: 11/18/2010 Document Revised: 03/02/2014 Document Reviewed: 05/15/2013 [facility] Patient Information 2015 [place], [place]. This information is not intended to replace advice given to you by your health care provider. Make sure you discuss any questions you have with your health care provider. Migraine Headache A migraine headache is an intense, throbbing pain on one or both sides of your head. A migraine can last for 30 minutes to several hours. CAUSES  The exact cause of a migraine headache is not always known. However, a migraine may be caused when nerves  in the brain become irritated and release chemicals that cause inflammation. This causes pain. Certain things may also trigger migraines, such as:  Alcohol.  Smoking.  Stress.  Menstruation.  Aged cheeses.  Foods or drinks that contain nitrates, glutamate, aspartame, or tyramine.  Lack of sleep.  Chocolate.  Caffeine.  Hunger.  Physical exertion.  Fatigue.  Medicines used to treat chest pain (nitroglycerine), birth control pills, estrogen, and some blood pressure medicines. SIGNS AND SYMPTOMS  Pain on one or both sides of your head.  Pulsating or throbbing pain.  Severe pain that prevents daily activities.  Pain that is aggravated by any physical activity.  Nausea, vomiting, or both.  Dizziness.  Pain with exposure to bright lights, loud noises, or activity.  General sensitivity to bright lights, loud noises, or smells. Before you get a migraine, you may get warning signs that a migraine is coming (aura). An aura may include:  Seeing flashing lights.  Seeing bright spots, halos, or zigzag lines.  Having tunnel vision or blurred vision.  Having feelings of numbness or tingling.  Having trouble talking.  Having muscle weakness. DIAGNOSIS  A migraine headache is often diagnosed based on:  Symptoms.  Physical exam.  A CT scan or MRI of your head. These imaging tests cannot diagnose migraines, but they can help rule out other causes of headaches. TREATMENT Medicines may be given for pain and nausea. Medicines can also be given to help prevent recurrent migraines.  HOME CARE INSTRUCTIONS  Only take  over-the-counter or prescription medicines for pain or discomfort as directed by your health care provider. The use of long-term narcotics is not recommended.  Lie down in a dark, quiet room when you have a migraine.  Keep a journal to find out what may trigger your migraine headaches. For example, write down:  What you eat and drink.  How much sleep  you get.  Any change to your diet or medicines.  Limit alcohol consumption.  Quit smoking if you smoke.  Get 7-9 hours of sleep, or as recommended by your health care provider.  Limit stress.  Keep lights dim if bright lights bother you and make your migraines worse. SEEK IMMEDIATE MEDICAL CARE IF:   Your migraine becomes severe.  You have a fever.  You have a stiff neck.  You have vision loss.  You have muscular weakness or loss of muscle control.  You start losing your balance or have trouble walking.  You feel faint or pass out.  You have severe symptoms that are different from your first symptoms. MAKE SURE YOU:   Understand these instructions.  Will watch your condition.  Will get help right away if you are not doing well or get worse. Document Released: 10/16/2005 Document Revised: 03/02/2014 Document Reviewed: 06/23/2013 [facility] Patient Information 2015 [place], [place]. This information is not intended to replace advice given to you by your health care provider. Make sure you discuss any questions you have with your health care provider.

## 2014-12-12 DIAGNOSIS — Z8669 Personal history of other diseases of the nervous system and sense organs: Secondary | ICD-10-CM

## 2014-12-15 ENCOUNTER — Ambulatory Visit (INDEPENDENT_AMBULATORY_CARE_PROVIDER_SITE_OTHER): Payer: BLUE CROSS/BLUE SHIELD | Admitting: Diagnostic Neuroimaging

## 2014-12-15 ENCOUNTER — Encounter: Payer: Self-pay | Admitting: Diagnostic Neuroimaging

## 2014-12-15 VITALS — BP 131/79 | HR 114 | Ht 62.5 in | Wt 188.4 lb

## 2014-12-15 DIAGNOSIS — G43109 Migraine with aura, not intractable, without status migrainosus: Secondary | ICD-10-CM

## 2014-12-15 DIAGNOSIS — O000 Abdominal pregnancy: Secondary | ICD-10-CM

## 2014-12-15 DIAGNOSIS — O0001 Abdominal pregnancy with intrauterine pregnancy: Secondary | ICD-10-CM

## 2014-12-15 NOTE — Patient Instructions (Signed)
Monitor headache symptoms, frequency, severity.  Continue tylenol #3 as needed.

## 2014-12-15 NOTE — Progress Notes (Signed)
[facility]  PATIENT: Sara [name] DOB: [birth year]  REFERRING CLINICIAN: Eulas Post, FNP HISTORY FROM: patient  REASON FOR VISIT: new consult    HISTORICAL  CHIEF COMPLAINT:  Chief Complaint  Patient presents with  . New Evaluation    migraines    HISTORY OF PRESENT ILLNESS:   29 year old right-handed female here for evaluation of headaches. Patient is G2 P1, currently pregnant, 20 weeks estimated gestational age. Also with hypertension, diabetes, anxiety.  Patient has history of headaches since age 60 years old which she describes as migraine headaches. She had global pounding headaches with eye pain, dizziness, photophobia, phonophobia, sometimes seeing stars before onset, lasting all day, and occurring twice per month. Patient would typically take Tylenol and ibuprofen, lay down, and headaches would go away. She was never on preventative medication. Patient felt she may have had some hormonal correlation with the headaches. Patient has family history of migraine headaches in her mother.  With her first pregnancy patient had increase in migraine headache frequency up to 4-5 per week. This lasted throughout pregnancy and after delivery, gradually subsided.  With current pregnancy, headaches have similarly increased in frequency. Headaches feel similar in severity and quality otherwise. Currently having 4-5 severe headaches per week. She has missed several days of work as result of severe headaches. Patient is taking Tylenol 3, once or twice per week, 2 tablets in a day, for headaches. If she lays down, rest, at the onset of headaches the headaches seem to be little bit better controlled.  Patient's due date is 05/05/2015. Patient is planning to apply for short-term disability due to severe headaches.   REVIEW OF SYSTEMS: Full 14 system review of systems performed and notable only for fatigue anemia headache dizziness anxiety allergy  sleepiness.  ALLERGIES: Allergies  Allergen Reactions  . Septra Ds [Sulfamethoxazole-Trimethoprim] Hives and Itching  . Lobster [Shellfish Allergy] Itching    Throat itching  . Latex Rash    HOME MEDICATIONS: Outpatient Prescriptions Prior to Visit  Medication Sig Dispense Refill  . acetaminophen-codeine (TYLENOL #3) 300-30 MG per tablet Take 1 tablet by mouth every 6 (six) hours as needed for moderate pain.    . ferrous sulfate 325 (65 FE) MG tablet Take 325 mg by mouth 2 (two) times daily with a meal.    . insulin aspart (NOVOLOG) 100 UNIT/ML injection Inject 6-10 Units into the skin 5 (five) times daily. 5 times daily with all meals including snacks    . insulin glargine (LANTUS) 100 UNIT/ML injection Inject 40 Units into the skin at bedtime.     . Insulin Pen Needle (1ST CHOICE PEN NEEDLES) 31G X 6 MM MISC Use with insulin pen 100 each 0  . Prenatal Vit-Fe Fumarate-FA (PRENATAL MULTIVITAMIN) TABS tablet Take 1 tablet by mouth daily at 12 noon.    . labetalol (NORMODYNE) 100 MG tablet Take 1 tablet (100 mg total) by mouth 2 (two) times daily. 100 tablet 2   No facility-administered medications prior to visit.    PAST MEDICAL HISTORY: Past Medical History  Diagnosis Date  . Infected pilonidal cyst   . Anxiety     diagnosed in 2007  . Diabetes mellitus     type 2 - uncontrolled  . Hypertension     benign essential  . Migraine     PAST SURGICAL HISTORY: Past Surgical History  Procedure Laterality Date  . No past surgeries      FAMILY HISTORY: Family History  Problem Relation Age of Onset  .  Diabetes Mother   . Diabetes Maternal Grandmother   . Hypertension Father   . Diabetes Paternal Grandmother   . Migraines Mother     SOCIAL HISTORY:  History   Social History  . Marital Status: Married    Spouse Name: N/A  . Number of Children: 1  . Years of Education: 12   Occupational History  . Not on file.   Social History Main Topics  . Smoking status: Never  Smoker   . Smokeless tobacco: Not on file  . Alcohol Use: No     Comment: drank 1-2 drinks a week before pregnancy  . Drug Use: No  . Sexual Activity: Yes   Other Topics Concern  . Not on file   Social History Narrative   Lives with boyfriend and child   Is pregnant (20weeks) with second child   1st born is a boy and her second is a boy   Drinks one cup of coffee a day     PHYSICAL EXAM  Filed Vitals:   12/15/14 0922  BP: 131/79  Pulse: 114  Height: 5' 2.5" (1.588 m)  Weight: 188 lb 6.4 oz (85.458 kg)    Body mass index is 33.89 kg/(m^2).   Visual Acuity Screening   Right eye Left eye Both eyes  Without correction: 20/200 20/100   With correction:       No flowsheet data found.  GENERAL EXAM: Patient is in no distress; well developed, nourished and groomed; neck is supple; SOFT SPOKEN, GRAVID ABDOMEN.  CARDIOVASCULAR: Regular rate and rhythm, no murmurs, no carotid bruits  NEUROLOGIC: MENTAL STATUS: awake, alert, oriented to person, place and time, recent and remote memory intact, normal attention and concentration, language fluent, comprehension intact, naming intact, fund of knowledge appropriate CRANIAL NERVE: no papilledema on fundoscopic exam, pupils equal and reactive to light, visual fields full to confrontation, extraocular muscles intact, no nystagmus, facial sensation and strength symmetric, hearing intact, palate elevates symmetrically, uvula midline, shoulder shrug symmetric, tongue midline. MOTOR: normal bulk and tone, full strength in the BUE, BLE SENSORY: normal and symmetric to light touch, temperature, vibration  COORDINATION: finger-nose-finger, fine finger movements normal REFLEXES: deep tendon reflexes present and symmetric GAIT/STATION: narrow based gait; able to walk tandem; romberg is negative    DIAGNOSTIC DATA (LABS, IMAGING, TESTING) - I reviewed patient records, labs, notes, testing and imaging myself where available.  Lab Results   Component Value Date   WBC 17.5* 12/04/2014   HGB 10.5* 12/04/2014   HCT 32.7* 12/04/2014   MCV 80.7 12/04/2014   PLT 278 12/04/2014      Component Value Date/Time   NA 133* 12/04/2014 2255   K 4.3 12/04/2014 2255   CL 107 12/04/2014 2255   CO2 23 12/04/2014 2255   GLUCOSE 174* 12/04/2014 2255   BUN 8 12/04/2014 2255   CREATININE 0.56 12/04/2014 2255   CALCIUM 8.8 12/04/2014 2255   PROT 6.1 12/04/2014 2255   ALBUMIN 3.4* 12/04/2014 2255   AST 13 12/04/2014 2255   ALT 13 12/04/2014 2255   ALKPHOS 47 12/04/2014 2255   BILITOT 0.4 12/04/2014 2255   GFRNONAA >90 12/04/2014 2255   GFRAA >90 12/04/2014 2255   No results found for: CHOL, HDL, LDLCALC, LDLDIRECT, TRIG, CHOLHDL Lab Results  Component Value Date   HGBA1C * 07/12/2009    8.4 (NOTE) The ADA recommends the following therapeutic goal for glycemic control related to Hgb A1c measurement: Goal of therapy: <6.5 Hgb A1c  Reference: American Diabetes  Association: Clinical Practice Recommendations 2010, Diabetes Care, 2010, 33: (Suppl  1).   No results found for: VITAMINB12 Lab Results  Component Value Date   TSH 0.289* 11/05/2010       ASSESSMENT AND PLAN  29 y.o. year old female here with migraine with aura, currently [redacted] weeks pregnant. Headaches are similar to prior migraines, except more frequent. This is similar to what happened in her first pregnancy. Neurologic exam is normal.  Discussed options for additional testing, treatment, but given current headaches, neuro exam and pregnant state, I recommend holding off on MRI scan of the brain. If her symptoms change, increased severity or change in type, and we may re-consider and order and MRI brain without contrast. Medication management is challenging during pregnancy, and for now I agree with current Tylenol 3 as needed.  Patient has been missing work frequently due to severe headaches, and therefore she is planning to apply for short-term disability.  Dx:  migraine with aura; current pregnancy   PLAN: - monitor symptoms - continue tylenol #3 as needed - patient planning to apply for short term disability due to frequent headaches; I will support patient with notes/documentation as needed   Return in about 3 months (around 03/15/2015).    Penni Bombard, MD 8/81/1031, 5:94 AM Certified in Neurology, Neurophysiology and Neuroimaging  [facility] [address] [place], [place] [address] [phone]

## 2015-01-05 ENCOUNTER — Telehealth: Payer: Self-pay | Admitting: *Deleted

## 2015-01-05 DIAGNOSIS — Z0289 Encounter for other administrative examinations: Secondary | ICD-10-CM

## 2015-01-05 NOTE — Telephone Encounter (Signed)
Form,[employer] sent to [facility] and Dr Leta Baptist 01-05-15.

## 2015-01-06 ENCOUNTER — Telehealth: Payer: Self-pay | Admitting: *Deleted

## 2015-01-06 NOTE — Telephone Encounter (Signed)
Called to inform the pt that her short-term disability paperwork was completed and faxed. She thanked me

## 2015-01-30 ENCOUNTER — Encounter (HOSPITAL_COMMUNITY): Payer: Self-pay | Admitting: *Deleted

## 2015-01-30 ENCOUNTER — Inpatient Hospital Stay (HOSPITAL_COMMUNITY)
Admission: AD | Admit: 2015-01-30 | Discharge: 2015-01-30 | Disposition: A | Discharge status: Home / Self Care | Payer: BLUE CROSS/BLUE SHIELD | Source: Ambulatory Visit | Attending: Obstetrics and Gynecology | Admitting: Obstetrics and Gynecology

## 2015-01-30 DIAGNOSIS — O10013 Pre-existing essential hypertension complicating pregnancy, third trimester: Secondary | ICD-10-CM | POA: Insufficient documentation

## 2015-01-30 DIAGNOSIS — O368130 Decreased fetal movements, third trimester, not applicable or unspecified: Secondary | ICD-10-CM | POA: Diagnosis present

## 2015-01-30 DIAGNOSIS — E119 Type 2 diabetes mellitus without complications: Secondary | ICD-10-CM | POA: Insufficient documentation

## 2015-01-30 DIAGNOSIS — Z3A36 36 weeks gestation of pregnancy: Secondary | ICD-10-CM | POA: Diagnosis not present

## 2015-01-30 DIAGNOSIS — O24113 Pre-existing diabetes mellitus, type 2, in pregnancy, third trimester: Secondary | ICD-10-CM | POA: Diagnosis not present

## 2015-01-30 DIAGNOSIS — O368122 Decreased fetal movements, second trimester, fetus 2: Secondary | ICD-10-CM

## 2015-01-30 LAB — URINALYSIS, ROUTINE W REFLEX MICROSCOPIC
BILIRUBIN URINE: NEGATIVE
Glucose, UA: >1000 mg/dL — AB
Hgb urine dipstick: NEGATIVE
Ketones, ur: NEGATIVE mg/dL
Leukocytes, UA: NEGATIVE
NITRITE: NEGATIVE
Protein, ur: NEGATIVE mg/dL
SPECIFIC GRAVITY, URINE: 1.01 (ref 1.005–1.030)
Urobilinogen, UA: 0.2 mg/dL (ref 0.0–1.0)
pH: 6 (ref 5.0–8.0)

## 2015-01-30 LAB — COMPREHENSIVE METABOLIC PANEL
ALBUMIN: 3.4 g/dL — AB (ref 3.5–5.2)
ALK PHOS: 54 U/L (ref 39–117)
ALT: 21 U/L (ref 0–35)
ANION GAP: 8 (ref 5–15)
AST: 18 U/L (ref 0–37)
BILIRUBIN TOTAL: 0.2 mg/dL — AB (ref 0.3–1.2)
BUN: <5 mg/dL — ABNORMAL LOW (ref 6–23)
CHLORIDE: 104 mmol/L (ref 96–112)
CO2: 22 mmol/L (ref 19–32)
CREATININE: 0.61 mg/dL (ref 0.50–1.10)
Calcium: 9.2 mg/dL (ref 8.4–10.5)
GFR calc Af Amer: >90 mL/min (ref >90)
GFR calc non Af Amer: >90 mL/min (ref >90)
Glucose, Bld: 137 mg/dL — ABNORMAL HIGH (ref 70–99)
POTASSIUM: 3.8 mmol/L (ref 3.5–5.1)
Sodium: 134 mmol/L — ABNORMAL LOW (ref 135–145)
Total Protein: 7 g/dL (ref 6.0–8.3)

## 2015-01-30 LAB — CBC
HCT: 31.9 % — ABNORMAL LOW (ref 36.0–46.0)
HEMOGLOBIN: 10.2 g/dL — AB (ref 12.0–15.0)
MCH: 25.6 pg — ABNORMAL LOW (ref 26.0–34.0)
MCHC: 32 g/dL (ref 30.0–36.0)
MCV: 80.2 fL (ref 78.0–100.0)
PLATELETS: 240 10*3/uL (ref 150–400)
RBC: 3.98 MIL/uL (ref 3.87–5.11)
RDW: 13.9 % (ref 11.5–15.5)
WBC: 15.9 10*3/uL — ABNORMAL HIGH (ref 4.0–10.5)

## 2015-01-30 LAB — LACTATE DEHYDROGENASE: LDH: 125 U/L (ref 94–250)

## 2015-01-30 LAB — URIC ACID: URIC ACID, SERUM: 4.2 mg/dL (ref 2.4–7.0)

## 2015-01-30 LAB — URINE MICROSCOPIC-ADD ON

## 2015-01-30 LAB — PROTEIN / CREATININE RATIO, URINE
CREATININE, URINE: 49 mg/dL
Total Protein, Urine: <6 mg/dL

## 2015-01-30 NOTE — MAU Provider Note (Addendum)
Sara [name] is a 29 y.o. G3P0111 at 26.3 weeks c/o decrease FM since 0600.  Denies vb lof or CTX   History     Patient Active Problem List   Diagnosis Date Noted  . History of preterm delivery, currently pregnant 12/05/2014  . History of migraine headaches 12/05/2014  . Generalized anxiety disorder 12/05/2014  . Anemia 12/05/2014  . History of high blood pressure 12/04/2014  . Type 2 diabetes mellitus without complication 07/62/2633  . Pilonidal abscess 09/30/2012    Chief Complaint  Patient presents with  . Decreased Fetal Movement   HPI  OB History    Gravida Para Term Preterm AB TAB SAB Ectopic Multiple Living   3 1  1 1  1   1       Past Medical History  Diagnosis Date  . Infected pilonidal cyst   . Anxiety     diagnosed in 2007  . Diabetes mellitus     type 2 - uncontrolled  . Hypertension     benign essential  . Migraine     Past Surgical History  Procedure Laterality Date  . No past surgeries      Family History  Problem Relation Age of Onset  . Diabetes Mother   . Diabetes Maternal Grandmother   . Hypertension Father   . Diabetes Paternal Grandmother   . Migraines Mother     History  Substance Use Topics  . Smoking status: Never Smoker   . Smokeless tobacco: Not on file  . Alcohol Use: No     Comment: drank 1-2 drinks a week before pregnancy    Allergies:  Allergies  Allergen Reactions  . Septra Ds [Sulfamethoxazole-Trimethoprim] Hives and Itching  . Lobster [Shellfish Allergy] Itching    Throat itching  . Latex Rash    Prescriptions prior to admission  Medication Sig Dispense Refill Last Dose  . acetaminophen-codeine (TYLENOL #3) 300-30 MG per tablet Take 1 tablet by mouth every 6 (six) hours as needed for moderate pain.   Past Week at Unknown time  . cetirizine (ZYRTEC) 10 MG tablet Take 10 mg by mouth daily.   01/29/2015 at Unknown time  . insulin aspart (NOVOLOG) 100 UNIT/ML injection Inject 25 Units into the skin 4 (four)  times daily.    01/30/2015 at Unknown time  . insulin glargine (LANTUS) 100 UNIT/ML injection Inject 50 Units into the skin at bedtime.    01/29/2015 at Unknown time  . Prenatal Vit-Fe Fumarate-FA (PRENATAL MULTIVITAMIN) TABS tablet Take 1 tablet by mouth daily at 12 noon.   01/29/2015 at Unknown time  . Insulin Pen Needle (1ST CHOICE PEN NEEDLES) 31G X 6 MM MISC Use with insulin pen 100 each 0 Taking  . Insulin Syringe-Needle U-100 (INSULIN SYRINGE .5CC/31GX5/16") 31G X 5/16" 0.5 ML MISC   0 Taking    ROS See HPI above, all other systems are negative  Physical Exam   Blood pressure 141/81, pulse 110, temperature 97.7 F (36.5 C), temperature source Oral, resp. rate 20, last menstrual period 07/29/2014.  Physical Exam Ext:  WNL ABD: Soft, non tender to palpation, no rebound or guarding SVE: deferred   ED Course  Assessment: IUP at  26.3weeks Membranes: intact FHR: Category 1 CTX:  none   Plan:  Labs: PIH labs    Kohl Polinsky, CNM, MSN 01/30/2015. 3:31 PM

## 2015-01-30 NOTE — MAU Provider Note (Signed)
MAU Addendum Note  Results for orders placed or performed during the hospital encounter of 01/30/15 (from the past 24 hour(s))  Protein / creatinine ratio, urine     Status: None   Collection Time: 01/30/15  2:45 PM  Result Value Ref Range   Creatinine, Urine 49.00 mg/dL   Total Protein, Urine <6 mg/dL   Protein Creatinine Ratio        0.00 - 0.15  Urinalysis, Routine w reflex microscopic     Status: Abnormal   Collection Time: 01/30/15  2:45 PM  Result Value Ref Range   Color, Urine YELLOW YELLOW   APPearance CLEAR CLEAR   Specific Gravity, Urine 1.010 1.005 - 1.030   pH 6.0 5.0 - 8.0   Glucose, UA >1000 (A) NEGATIVE mg/dL   Hgb urine dipstick NEGATIVE NEGATIVE   Bilirubin Urine NEGATIVE NEGATIVE   Ketones, ur NEGATIVE NEGATIVE mg/dL   Protein, ur NEGATIVE NEGATIVE mg/dL   Urobilinogen, UA 0.2 0.0 - 1.0 mg/dL   Nitrite NEGATIVE NEGATIVE   Leukocytes, UA NEGATIVE NEGATIVE  Urine microscopic-add on     Status: None   Collection Time: 01/30/15  2:45 PM  Result Value Ref Range   Squamous Epithelial / LPF RARE RARE   WBC, UA 0-2 <3 WBC/hpf   RBC / HPF 0-2 <3 RBC/hpf   Bacteria, UA RARE RARE  CBC     Status: Abnormal   Collection Time: 01/30/15  3:47 PM  Result Value Ref Range   WBC 15.9 (H) 4.0 - 10.5 K/uL   RBC 3.98 3.87 - 5.11 MIL/uL   Hemoglobin 10.2 (L) 12.0 - 15.0 g/dL   HCT 31.9 (L) 36.0 - 46.0 %   MCV 80.2 78.0 - 100.0 fL   MCH 25.6 (L) 26.0 - 34.0 pg   MCHC 32.0 30.0 - 36.0 g/dL   RDW 13.9 11.5 - 15.5 %   Platelets 240 150 - 400 K/uL  Comprehensive metabolic panel     Status: Abnormal   Collection Time: 01/30/15  3:47 PM  Result Value Ref Range   Sodium 134 (L) 135 - 145 mmol/L   Potassium 3.8 3.5 - 5.1 mmol/L   Chloride 104 96 - 112 mmol/L   CO2 22 19 - 32 mmol/L   Glucose, Bld 137 (H) 70 - 99 mg/dL   BUN <5 (L) 6 - 23 mg/dL   Creatinine, Ser 0.61 0.50 - 1.10 mg/dL   Calcium 9.2 8.4 - 10.5 mg/dL   Total Protein 7.0 6.0 - 8.3 g/dL   Albumin 3.4 (L) 3.5 - 5.2  g/dL   AST 18 0 - 37 U/L   ALT 21 0 - 35 U/L   Alkaline Phosphatase 54 39 - 117 U/L   Total Bilirubin 0.2 (L) 0.3 - 1.2 mg/dL   GFR calc non Af Amer >90 >90 mL/min   GFR calc Af Amer >90 >90 mL/min   Anion gap 8 5 - 15  Uric acid     Status: None   Collection Time: 01/30/15  3:47 PM  Result Value Ref Range   Uric Acid, Serum 4.2 2.4 - 7.0 mg/dL  Lactate dehydrogenase     Status: None   Collection Time: 01/30/15  3:47 PM  Result Value Ref Range   LDH 125 94 - 250 U/L     Plan: -encouraged to take Iron BID -Discussed need to follow up in office for next OB appointment -Bleeding and PTL Precautions -Encouraged to call if any questions or concerns arise prior  to next scheduled office visit.  -Discharged to home in stable condition    Lugene Hitt, CNM, MSN 01/30/2015. 5:04 PM

## 2015-01-30 NOTE — Progress Notes (Deleted)
Sara [name] is a 29 y.o. G3P0111 at 36.3 weeks c/o decrease FM since 0600.  Denies vb lof or CTX   History     Patient Active Problem List   Diagnosis Date Noted  . History of preterm delivery, currently pregnant 12/05/2014  . History of migraine headaches 12/05/2014  . Generalized anxiety disorder 12/05/2014  . Anemia 12/05/2014  . History of high blood pressure 12/04/2014  . Type 2 diabetes mellitus without complication 24/58/0998  . Pilonidal abscess 09/30/2012    Chief Complaint  Patient presents with  . Decreased Fetal Movement   HPI  OB History    Gravida Para Term Preterm AB TAB SAB Ectopic Multiple Living   3 1  1 1  1   1       Past Medical History  Diagnosis Date  . Infected pilonidal cyst   . Anxiety     diagnosed in 2007  . Diabetes mellitus     type 2 - uncontrolled  . Hypertension     benign essential  . Migraine     Past Surgical History  Procedure Laterality Date  . No past surgeries      Family History  Problem Relation Age of Onset  . Diabetes Mother   . Diabetes Maternal Grandmother   . Hypertension Father   . Diabetes Paternal Grandmother   . Migraines Mother     History  Substance Use Topics  . Smoking status: Never Smoker   . Smokeless tobacco: Not on file  . Alcohol Use: No     Comment: drank 1-2 drinks a week before pregnancy    Allergies:  Allergies  Allergen Reactions  . Septra Ds [Sulfamethoxazole-Trimethoprim] Hives and Itching  . Lobster [Shellfish Allergy] Itching    Throat itching  . Latex Rash    Prescriptions prior to admission  Medication Sig Dispense Refill Last Dose  . acetaminophen-codeine (TYLENOL #3) 300-30 MG per tablet Take 1 tablet by mouth every 6 (six) hours as needed for moderate pain.   Past Week at Unknown time  . cetirizine (ZYRTEC) 10 MG tablet Take 10 mg by mouth daily.   01/29/2015 at Unknown time  . insulin aspart (NOVOLOG) 100 UNIT/ML injection Inject 25 Units into the skin 4 (four)  times daily.    01/30/2015 at Unknown time  . insulin glargine (LANTUS) 100 UNIT/ML injection Inject 50 Units into the skin at bedtime.    01/29/2015 at Unknown time  . Prenatal Vit-Fe Fumarate-FA (PRENATAL MULTIVITAMIN) TABS tablet Take 1 tablet by mouth daily at 12 noon.   01/29/2015 at Unknown time  . Insulin Pen Needle (1ST CHOICE PEN NEEDLES) 31G X 6 MM MISC Use with insulin pen 100 each 0 Taking  . Insulin Syringe-Needle U-100 (INSULIN SYRINGE .5CC/31GX5/16") 31G X 5/16" 0.5 ML MISC   0 Taking    ROS See HPI above, all other systems are negative  Physical Exam   Blood pressure 141/81, pulse 110, temperature 97.7 F (36.5 C), temperature source Oral, resp. rate 20, last menstrual period 07/29/2014.  Physical Exam Ext:  WNL ABD: Soft, non tender to palpation, no rebound or guarding SVE: deferred   ED Course  Assessment: IUP at  26.3weeks Membranes: intact FHR: Category 1 CTX:  none   Plan:  Labs: PIH labs    Yury Schaus, CNM, MSN 01/30/2015. 3:31 PM

## 2015-01-30 NOTE — MAU Note (Signed)
Pt has not felt regular movement since 0600 and wanted to make sure everything was ok.  Denies LOF/VB/cramping.

## 2015-03-01 ENCOUNTER — Encounter: Payer: Self-pay | Admitting: *Deleted

## 2015-03-03 ENCOUNTER — Telehealth: Payer: Self-pay | Admitting: *Deleted

## 2015-03-03 NOTE — Telephone Encounter (Signed)
[employer] [place] faxed form.

## 2015-03-11 NOTE — Telephone Encounter (Signed)
Patient called and stated that she needs another short-term disability form completed and faxed and would like to speak with someone regarding this. Please call and advise.

## 2015-03-12 ENCOUNTER — Telehealth: Payer: Self-pay | Admitting: *Deleted

## 2015-03-12 ENCOUNTER — Inpatient Hospital Stay (HOSPITAL_COMMUNITY): Payer: BLUE CROSS/BLUE SHIELD

## 2015-03-12 ENCOUNTER — Encounter (HOSPITAL_COMMUNITY): Payer: Self-pay | Admitting: *Deleted

## 2015-03-12 ENCOUNTER — Inpatient Hospital Stay (HOSPITAL_COMMUNITY)
Admission: AD | Admit: 2015-03-12 | Discharge: 2015-03-16 | DRG: 781 | Disposition: A | Discharge status: Home / Self Care | Payer: BLUE CROSS/BLUE SHIELD | Source: Ambulatory Visit | Attending: Obstetrics and Gynecology | Admitting: Obstetrics and Gynecology

## 2015-03-12 DIAGNOSIS — E119 Type 2 diabetes mellitus without complications: Secondary | ICD-10-CM

## 2015-03-12 DIAGNOSIS — O99343 Other mental disorders complicating pregnancy, third trimester: Secondary | ICD-10-CM | POA: Diagnosis present

## 2015-03-12 DIAGNOSIS — O133 Gestational [pregnancy-induced] hypertension without significant proteinuria, third trimester: Secondary | ICD-10-CM | POA: Diagnosis not present

## 2015-03-12 DIAGNOSIS — O99013 Anemia complicating pregnancy, third trimester: Secondary | ICD-10-CM | POA: Diagnosis present

## 2015-03-12 DIAGNOSIS — Z888 Allergy status to other drugs, medicaments and biological substances status: Secondary | ICD-10-CM

## 2015-03-12 DIAGNOSIS — O99713 Diseases of the skin and subcutaneous tissue complicating pregnancy, third trimester: Secondary | ICD-10-CM | POA: Diagnosis present

## 2015-03-12 DIAGNOSIS — Z3A32 32 weeks gestation of pregnancy: Secondary | ICD-10-CM | POA: Diagnosis present

## 2015-03-12 DIAGNOSIS — L0501 Pilonidal cyst with abscess: Secondary | ICD-10-CM | POA: Diagnosis present

## 2015-03-12 DIAGNOSIS — O1403 Mild to moderate pre-eclampsia, third trimester: Principal | ICD-10-CM | POA: Diagnosis present

## 2015-03-12 DIAGNOSIS — O479 False labor, unspecified: Secondary | ICD-10-CM | POA: Diagnosis present

## 2015-03-12 DIAGNOSIS — F411 Generalized anxiety disorder: Secondary | ICD-10-CM | POA: Diagnosis present

## 2015-03-12 DIAGNOSIS — Z794 Long term (current) use of insulin: Secondary | ICD-10-CM

## 2015-03-12 DIAGNOSIS — Z91040 Latex allergy status: Secondary | ICD-10-CM | POA: Diagnosis not present

## 2015-03-12 DIAGNOSIS — O24414 Gestational diabetes mellitus in pregnancy, insulin controlled: Secondary | ICD-10-CM

## 2015-03-12 DIAGNOSIS — O09213 Supervision of pregnancy with history of pre-term labor, third trimester: Secondary | ICD-10-CM

## 2015-03-12 DIAGNOSIS — Z91013 Allergy to seafood: Secondary | ICD-10-CM | POA: Diagnosis not present

## 2015-03-12 DIAGNOSIS — O98313 Other infections with a predominantly sexual mode of transmission complicating pregnancy, third trimester: Secondary | ICD-10-CM | POA: Diagnosis present

## 2015-03-12 DIAGNOSIS — A6000 Herpesviral infection of urogenital system, unspecified: Secondary | ICD-10-CM | POA: Diagnosis present

## 2015-03-12 DIAGNOSIS — O368390 Maternal care for abnormalities of the fetal heart rate or rhythm, unspecified trimester, not applicable or unspecified: Secondary | ICD-10-CM

## 2015-03-12 DIAGNOSIS — O4700 False labor before 37 completed weeks of gestation, unspecified trimester: Secondary | ICD-10-CM | POA: Diagnosis present

## 2015-03-12 DIAGNOSIS — Z6836 Body mass index (BMI) 36.0-36.9, adult: Secondary | ICD-10-CM

## 2015-03-12 DIAGNOSIS — O1493 Unspecified pre-eclampsia, third trimester: Secondary | ICD-10-CM | POA: Diagnosis present

## 2015-03-12 DIAGNOSIS — Z882 Allergy status to sulfonamides status: Secondary | ICD-10-CM

## 2015-03-12 DIAGNOSIS — O24113 Pre-existing diabetes mellitus, type 2, in pregnancy, third trimester: Secondary | ICD-10-CM | POA: Diagnosis present

## 2015-03-12 DIAGNOSIS — O2243 Hemorrhoids in pregnancy, third trimester: Secondary | ICD-10-CM | POA: Diagnosis present

## 2015-03-12 DIAGNOSIS — B009 Herpesviral infection, unspecified: Secondary | ICD-10-CM | POA: Diagnosis not present

## 2015-03-12 DIAGNOSIS — O99353 Diseases of the nervous system complicating pregnancy, third trimester: Secondary | ICD-10-CM | POA: Diagnosis present

## 2015-03-12 DIAGNOSIS — O98519 Other viral diseases complicating pregnancy, unspecified trimester: Secondary | ICD-10-CM

## 2015-03-12 DIAGNOSIS — O24319 Unspecified pre-existing diabetes mellitus in pregnancy, unspecified trimester: Secondary | ICD-10-CM

## 2015-03-12 DIAGNOSIS — K649 Unspecified hemorrhoids: Secondary | ICD-10-CM | POA: Diagnosis not present

## 2015-03-12 DIAGNOSIS — G43909 Migraine, unspecified, not intractable, without status migrainosus: Secondary | ICD-10-CM | POA: Diagnosis present

## 2015-03-12 HISTORY — DX: Maternal care for abnormalities of the fetal heart rate or rhythm, unspecified trimester, not applicable or unspecified: O36.8390

## 2015-03-12 LAB — CBC
HCT: 31.9 % — ABNORMAL LOW (ref 36.0–46.0)
Hemoglobin: 10.3 g/dL — ABNORMAL LOW (ref 12.0–15.0)
MCH: 25.8 pg — AB (ref 26.0–34.0)
MCHC: 32.3 g/dL (ref 30.0–36.0)
MCV: 79.8 fL (ref 78.0–100.0)
PLATELETS: 255 10*3/uL (ref 150–400)
RBC: 4 MIL/uL (ref 3.87–5.11)
RDW: 14.2 % (ref 11.5–15.5)
WBC: 16.7 10*3/uL — AB (ref 4.0–10.5)

## 2015-03-12 LAB — COMPREHENSIVE METABOLIC PANEL
ALK PHOS: 64 U/L (ref 38–126)
ALT: 16 U/L (ref 14–54)
ANION GAP: 9 (ref 5–15)
AST: 17 U/L (ref 15–41)
Albumin: 3.4 g/dL — ABNORMAL LOW (ref 3.5–5.0)
BUN: 6 mg/dL (ref 6–20)
CHLORIDE: 105 mmol/L (ref 101–111)
CO2: 19 mmol/L — ABNORMAL LOW (ref 22–32)
CREATININE: 0.5 mg/dL (ref 0.44–1.00)
Calcium: 8.9 mg/dL (ref 8.9–10.3)
GFR calc Af Amer: >60 mL/min (ref >60)
GFR calc non Af Amer: >60 mL/min (ref >60)
GLUCOSE: 121 mg/dL — AB (ref 65–99)
POTASSIUM: 3.8 mmol/L (ref 3.5–5.1)
SODIUM: 133 mmol/L — AB (ref 135–145)
TOTAL PROTEIN: 7.2 g/dL (ref 6.5–8.1)
Total Bilirubin: 0.2 mg/dL — ABNORMAL LOW (ref 0.3–1.2)

## 2015-03-12 LAB — URINALYSIS, ROUTINE W REFLEX MICROSCOPIC
Bilirubin Urine: NEGATIVE
Glucose, UA: >1000 mg/dL — AB
HGB URINE DIPSTICK: NEGATIVE
Ketones, ur: 15 mg/dL — AB
Leukocytes, UA: NEGATIVE
Nitrite: NEGATIVE
PROTEIN: NEGATIVE mg/dL
Specific Gravity, Urine: 1.015 (ref 1.005–1.030)
Urobilinogen, UA: 0.2 mg/dL (ref 0.0–1.0)
pH: 6.5 (ref 5.0–8.0)

## 2015-03-12 LAB — PROTEIN / CREATININE RATIO, URINE
Creatinine, Urine: 97 mg/dL
Protein Creatinine Ratio: 0.12 mg/mg{Cre} (ref 0.00–0.15)
TOTAL PROTEIN, URINE: 12 mg/dL

## 2015-03-12 LAB — URINE MICROSCOPIC-ADD ON

## 2015-03-12 LAB — GROUP B STREP BY PCR: Group B strep by PCR: NEGATIVE

## 2015-03-12 LAB — URIC ACID: Uric Acid, Serum: 3.8 mg/dL (ref 2.3–6.6)

## 2015-03-12 LAB — LACTATE DEHYDROGENASE: LDH: 139 U/L (ref 98–192)

## 2015-03-12 LAB — OB RESULTS CONSOLE GBS: GBS: NEGATIVE

## 2015-03-12 MED ORDER — LACTATED RINGERS IV BOLUS (SEPSIS)
500.0000 mL | Freq: Once | INTRAVENOUS | Status: AC
  Administered 2015-03-13: 500 mL via INTRAVENOUS

## 2015-03-12 MED ORDER — DEXAMETHASONE SODIUM PHOSPHATE 10 MG/ML IJ SOLN: 10.0000 mg | Freq: Once | INTRAMUSCULAR | Status: DC

## 2015-03-12 MED ORDER — BETAMETHASONE SOD PHOS & ACET 6 (3-3) MG/ML IJ SUSP
12.0000 mg | Freq: Once | INTRAMUSCULAR | Status: AC
  Administered 2015-03-12: 12 mg via INTRAMUSCULAR
  Filled 2015-03-12: qty 2

## 2015-03-12 MED ORDER — DIPHENHYDRAMINE HCL 50 MG/ML IJ SOLN: 25.0000 mg | Freq: Once | INTRAMUSCULAR | Status: DC

## 2015-03-12 MED ORDER — SODIUM CHLORIDE 0.9 % IV SOLN: INTRAVENOUS | Status: DC

## 2015-03-12 MED ORDER — PROCHLORPERAZINE EDISYLATE 5 MG/ML IJ SOLN
10.0000 mg | Freq: Once | INTRAMUSCULAR | Status: DC
  Filled 2015-03-12: qty 2

## 2015-03-12 MED ORDER — LACTATED RINGERS IV SOLN
INTRAVENOUS | Status: DC
  Administered 2015-03-13 – 2015-03-14 (×5): via INTRAVENOUS

## 2015-03-12 NOTE — Progress Notes (Signed)
Merlyn Albert, CNM in department. Notified that labs have resulted.

## 2015-03-12 NOTE — MAU Note (Signed)
Urine in lab 

## 2015-03-12 NOTE — Telephone Encounter (Signed)
Form,[employer] sent to [facility] and Dr Leta Baptist 03/12/15.

## 2015-03-12 NOTE — MAU Provider Note (Signed)
Sara [name] is a 29 y.o. G3P0 at 32.2 weeks presents to MAU for elevated BP and HA that is starting to go away.   History     Patient Active Problem List   Diagnosis Date Noted  . History of preterm delivery, currently pregnant 12/05/2014  . History of migraine headaches 12/05/2014  . Generalized anxiety disorder 12/05/2014  . Anemia 12/05/2014  . History of high blood pressure 12/04/2014  . Type 2 diabetes mellitus without complication 72/06/4708  . Pilonidal abscess 09/30/2012    No chief complaint on file.  HPI  OB History    Gravida Para Term Preterm AB TAB SAB Ectopic Multiple Living   3 1  1 1  1   1       Past Medical History  Diagnosis Date  . Infected pilonidal cyst   . Anxiety     diagnosed in 2007  . Diabetes mellitus     type 2 - uncontrolled  . Hypertension     benign essential  . Migraine     Past Surgical History  Procedure Laterality Date  . No past surgeries      Family History  Problem Relation Age of Onset  . Diabetes Mother   . Diabetes Maternal Grandmother   . Hypertension Father   . Diabetes Paternal Grandmother   . Migraines Mother     History  Substance Use Topics  . Smoking status: Never Smoker   . Smokeless tobacco: Not on file  . Alcohol Use: No     Comment: drank 1-2 drinks a week before pregnancy    Allergies:  Allergies  Allergen Reactions  . Septra Ds [Sulfamethoxazole-Trimethoprim] Hives and Itching  . Lobster [Shellfish Allergy] Itching    Throat itching  . Latex Rash    Prescriptions prior to admission  Medication Sig Dispense Refill Last Dose  . acetaminophen-codeine (TYLENOL #3) 300-30 MG per tablet Take 1 tablet by mouth every 6 (six) hours as needed for moderate pain.   Past Week at Unknown time  . cetirizine (ZYRTEC) 10 MG tablet Take 10 mg by mouth daily.   Past Week at Unknown time  . insulin aspart (NOVOLOG) 100 UNIT/ML injection Inject 15-35 Units into the skin 4 (four) times daily.    03/11/2015  at Unknown time  . insulin glargine (LANTUS) 100 UNIT/ML injection Inject 48 Units into the skin at bedtime.    03/11/2015 at Unknown time  . Prenatal Vit-Fe Fumarate-FA (PRENATAL MULTIVITAMIN) TABS tablet Take 1 tablet by mouth daily at 12 noon.   03/11/2015 at Unknown time  . Insulin Pen Needle (1ST CHOICE PEN NEEDLES) 31G X 6 MM MISC Use with insulin pen 100 each 0 Taking  . Insulin Syringe-Needle U-100 (INSULIN SYRINGE .5CC/31GX5/16") 31G X 5/16" 0.5 ML MISC   0 Taking    ROS See HPI above, all other systems are negative  Physical Exam   Blood pressure 155/93, pulse 123, last menstrual period 07/29/2014, SpO2 99 %.  Physical Exam Ext:  WNL ABD: Soft, non tender to palpation, no rebound or guarding SVE: deferred   ED Course  Assessment: IUP at  32.2weeks Membranes: intact FHR: Category 1, 155, moderate variability, + accel, no decel CTX:  None   Plan:  Labs:PIH labs   Braelynn Lupton, CNM, MSN 03/12/2015. 5:00 PM

## 2015-03-12 NOTE — Progress Notes (Signed)
Patient declines IV and meds. States HA almost gone after drinking PO fluids and eating crackers/peanut butter.

## 2015-03-12 NOTE — MAU Note (Signed)
Notified Farrel Gordon, CNM of increased HTN.  No further orders at this time.  Will continue to monitor.  Patient to go to [place] when they call for her.

## 2015-03-12 NOTE — MAU Note (Signed)
Farrel Gordon, CNM notified of patient having a couple variable and late decelerations and UI/irregular contractions.  Patient states contractions are not painful and states headache has gone away.  Denies blurred vision/epigastric pain as well.  Discussed lab have resulted.  CNM to come see patient.

## 2015-03-12 NOTE — Telephone Encounter (Signed)
Spoke to the pt on the phone and informed her that her paperwork has been faxed to [facility] She asked me to hold onto the paperwork and that she would pick it up at her appt on 03/17/15. She thanked me

## 2015-03-12 NOTE — Telephone Encounter (Signed)
Spoke to the pt on the phone, apologized about the FMLA paperwork not being done correctly and informed her that I would be getting the new paperwork faxed for her leave today. She asked me to call her back and let her know when it was done. i told her i would

## 2015-03-13 ENCOUNTER — Inpatient Hospital Stay (HOSPITAL_COMMUNITY): Payer: BLUE CROSS/BLUE SHIELD

## 2015-03-13 DIAGNOSIS — Z3A32 32 weeks gestation of pregnancy: Secondary | ICD-10-CM | POA: Diagnosis present

## 2015-03-13 DIAGNOSIS — Z882 Allergy status to sulfonamides status: Secondary | ICD-10-CM | POA: Diagnosis not present

## 2015-03-13 DIAGNOSIS — Z794 Long term (current) use of insulin: Secondary | ICD-10-CM | POA: Diagnosis not present

## 2015-03-13 DIAGNOSIS — O1403 Mild to moderate pre-eclampsia, third trimester: Secondary | ICD-10-CM | POA: Diagnosis present

## 2015-03-13 DIAGNOSIS — L0501 Pilonidal cyst with abscess: Secondary | ICD-10-CM | POA: Diagnosis present

## 2015-03-13 DIAGNOSIS — O2243 Hemorrhoids in pregnancy, third trimester: Secondary | ICD-10-CM | POA: Diagnosis present

## 2015-03-13 DIAGNOSIS — O99713 Diseases of the skin and subcutaneous tissue complicating pregnancy, third trimester: Secondary | ICD-10-CM | POA: Diagnosis present

## 2015-03-13 DIAGNOSIS — O4700 False labor before 37 completed weeks of gestation, unspecified trimester: Secondary | ICD-10-CM | POA: Diagnosis present

## 2015-03-13 DIAGNOSIS — O99013 Anemia complicating pregnancy, third trimester: Secondary | ICD-10-CM | POA: Diagnosis present

## 2015-03-13 DIAGNOSIS — A6000 Herpesviral infection of urogenital system, unspecified: Secondary | ICD-10-CM | POA: Diagnosis present

## 2015-03-13 DIAGNOSIS — Z888 Allergy status to other drugs, medicaments and biological substances status: Secondary | ICD-10-CM | POA: Diagnosis not present

## 2015-03-13 DIAGNOSIS — Z91013 Allergy to seafood: Secondary | ICD-10-CM | POA: Diagnosis not present

## 2015-03-13 DIAGNOSIS — E119 Type 2 diabetes mellitus without complications: Secondary | ICD-10-CM | POA: Diagnosis present

## 2015-03-13 DIAGNOSIS — O99353 Diseases of the nervous system complicating pregnancy, third trimester: Secondary | ICD-10-CM | POA: Diagnosis present

## 2015-03-13 DIAGNOSIS — O98313 Other infections with a predominantly sexual mode of transmission complicating pregnancy, third trimester: Secondary | ICD-10-CM | POA: Diagnosis present

## 2015-03-13 DIAGNOSIS — F411 Generalized anxiety disorder: Secondary | ICD-10-CM | POA: Diagnosis present

## 2015-03-13 DIAGNOSIS — O09213 Supervision of pregnancy with history of pre-term labor, third trimester: Secondary | ICD-10-CM | POA: Diagnosis not present

## 2015-03-13 DIAGNOSIS — O133 Gestational [pregnancy-induced] hypertension without significant proteinuria, third trimester: Secondary | ICD-10-CM | POA: Diagnosis present

## 2015-03-13 DIAGNOSIS — O24113 Pre-existing diabetes mellitus, type 2, in pregnancy, third trimester: Secondary | ICD-10-CM | POA: Diagnosis present

## 2015-03-13 DIAGNOSIS — O99343 Other mental disorders complicating pregnancy, third trimester: Secondary | ICD-10-CM | POA: Diagnosis present

## 2015-03-13 DIAGNOSIS — O479 False labor, unspecified: Secondary | ICD-10-CM | POA: Diagnosis present

## 2015-03-13 DIAGNOSIS — Z91040 Latex allergy status: Secondary | ICD-10-CM | POA: Diagnosis not present

## 2015-03-13 DIAGNOSIS — G43909 Migraine, unspecified, not intractable, without status migrainosus: Secondary | ICD-10-CM | POA: Diagnosis present

## 2015-03-13 LAB — CBC
HEMATOCRIT: 33.7 % — AB (ref 36.0–46.0)
HEMOGLOBIN: 10.9 g/dL — AB (ref 12.0–15.0)
MCH: 25.8 pg — AB (ref 26.0–34.0)
MCHC: 32.3 g/dL (ref 30.0–36.0)
MCV: 79.9 fL (ref 78.0–100.0)
Platelets: 281 10*3/uL (ref 150–400)
RBC: 4.22 MIL/uL (ref 3.87–5.11)
RDW: 14.3 % (ref 11.5–15.5)
WBC: 20.7 10*3/uL — ABNORMAL HIGH (ref 4.0–10.5)

## 2015-03-13 LAB — COMPREHENSIVE METABOLIC PANEL
ALT: 19 U/L (ref 14–54)
ANION GAP: 12 (ref 5–15)
AST: 19 U/L (ref 15–41)
Albumin: 3.5 g/dL (ref 3.5–5.0)
Alkaline Phosphatase: 73 U/L (ref 38–126)
BUN: 6 mg/dL (ref 6–20)
CHLORIDE: 101 mmol/L (ref 101–111)
CO2: 19 mmol/L — AB (ref 22–32)
CREATININE: 0.63 mg/dL (ref 0.44–1.00)
Calcium: 8.6 mg/dL — ABNORMAL LOW (ref 8.9–10.3)
GLUCOSE: 265 mg/dL — AB (ref 65–99)
POTASSIUM: 4.3 mmol/L (ref 3.5–5.1)
Sodium: 132 mmol/L — ABNORMAL LOW (ref 135–145)
Total Bilirubin: 0.6 mg/dL (ref 0.3–1.2)
Total Protein: 7.1 g/dL (ref 6.5–8.1)

## 2015-03-13 LAB — GLUCOSE, CAPILLARY
GLUCOSE-CAPILLARY: 200 mg/dL — AB (ref 65–99)
GLUCOSE-CAPILLARY: 211 mg/dL — AB (ref 65–99)
GLUCOSE-CAPILLARY: 114 mg/dL — AB (ref 65–99)
Glucose-Capillary: 140 mg/dL — ABNORMAL HIGH (ref 65–99)
Glucose-Capillary: 76 mg/dL (ref 65–99)

## 2015-03-13 LAB — URIC ACID: Uric Acid, Serum: 4.3 mg/dL (ref 2.3–6.6)

## 2015-03-13 LAB — ABO/RH: ABO/RH(D): B POS

## 2015-03-13 LAB — TYPE AND SCREEN
ABO/RH(D): B POS
ANTIBODY SCREEN: NEGATIVE

## 2015-03-13 LAB — MAGNESIUM: Magnesium: 3.5 mg/dL — ABNORMAL HIGH (ref 1.7–2.4)

## 2015-03-13 LAB — LACTATE DEHYDROGENASE: LDH: 162 U/L (ref 98–192)

## 2015-03-13 MED ORDER — CALCIUM CARBONATE ANTACID 500 MG PO CHEW: 2.0000 | CHEWABLE_TABLET | ORAL | Status: DC | PRN

## 2015-03-13 MED ORDER — DOCUSATE SODIUM 100 MG PO CAPS
100.0000 mg | ORAL_CAPSULE | Freq: Every day | ORAL | Status: DC
Start: 2015-03-13 — End: 2015-03-16
  Administered 2015-03-13 – 2015-03-16 (×4): 100 mg via ORAL
  Filled 2015-03-13 (×5): qty 1

## 2015-03-13 MED ORDER — INSULIN ASPART 100 UNIT/ML ~~LOC~~ SOLN
4.0000 [IU] | Freq: Three times a day (TID) | SUBCUTANEOUS | Status: DC
  Administered 2015-03-13: 10 [IU] via SUBCUTANEOUS

## 2015-03-13 MED ORDER — INSULIN ASPART 100 UNIT/ML ~~LOC~~ SOLN
35.0000 [IU] | Freq: Three times a day (TID) | SUBCUTANEOUS | Status: DC
  Administered 2015-03-13 (×2): 35 [IU] via SUBCUTANEOUS
  Administered 2015-03-14: 10 [IU] via SUBCUTANEOUS
  Administered 2015-03-14 – 2015-03-16 (×6): 35 [IU] via SUBCUTANEOUS

## 2015-03-13 MED ORDER — CALCIUM CARBONATE ANTACID 500 MG PO CHEW
2.0000 | CHEWABLE_TABLET | ORAL | Status: DC | PRN
  Filled 2015-03-13: qty 2

## 2015-03-13 MED ORDER — PRENATAL MULTIVITAMIN CH
1.0000 | ORAL_TABLET | Freq: Every day | ORAL | Status: DC
  Administered 2015-03-13 – 2015-03-16 (×4): 1 via ORAL
  Filled 2015-03-13 (×5): qty 1

## 2015-03-13 MED ORDER — INSULIN ASPART 100 UNIT/ML ~~LOC~~ SOLN
35.0000 [IU] | Freq: Three times a day (TID) | SUBCUTANEOUS | Status: DC
  Administered 2015-03-13: 35 [IU] via SUBCUTANEOUS

## 2015-03-13 MED ORDER — MAGNESIUM SULFATE 50 % IJ SOLN
2.0000 g/h | INTRAVENOUS | Status: DC
  Administered 2015-03-13 (×2): 2 g/h via INTRAVENOUS
  Filled 2015-03-13 (×2): qty 80

## 2015-03-13 MED ORDER — BUTORPHANOL TARTRATE 1 MG/ML IJ SOLN
1.0000 mg | Freq: Once | INTRAMUSCULAR | Status: AC
  Administered 2015-03-13: 1 mg via INTRAVENOUS

## 2015-03-13 MED ORDER — BUTORPHANOL TARTRATE 1 MG/ML IJ SOLN
INTRAMUSCULAR | Status: AC
  Filled 2015-03-13: qty 1

## 2015-03-13 MED ORDER — ACETAMINOPHEN 325 MG PO TABS
650.0000 mg | ORAL_TABLET | ORAL | Status: DC | PRN
  Filled 2015-03-13: qty 2

## 2015-03-13 MED ORDER — DOCUSATE SODIUM 100 MG PO CAPS: 100.0000 mg | ORAL_CAPSULE | Freq: Every day | ORAL | Status: DC

## 2015-03-13 MED ORDER — FERROUS SULFATE 325 (65 FE) MG PO TABS
325.0000 mg | ORAL_TABLET | Freq: Two times a day (BID) | ORAL | Status: DC
  Administered 2015-03-13 – 2015-03-16 (×7): 325 mg via ORAL
  Filled 2015-03-13 (×7): qty 1

## 2015-03-13 MED ORDER — ACETAMINOPHEN-CODEINE #3 300-30 MG PO TABS
1.0000 | ORAL_TABLET | Freq: Four times a day (QID) | ORAL | Status: DC | PRN
  Administered 2015-03-13 – 2015-03-14 (×5): 1 via ORAL
  Filled 2015-03-13 (×5): qty 1

## 2015-03-13 MED ORDER — MAGNESIUM SULFATE BOLUS VIA INFUSION
4.0000 g | Freq: Once | INTRAVENOUS | Status: AC
  Administered 2015-03-13: 4 g via INTRAVENOUS
  Filled 2015-03-13: qty 500

## 2015-03-13 MED ORDER — INSULIN GLARGINE 100 UNIT/ML ~~LOC~~ SOLN
48.0000 [IU] | Freq: Every day | SUBCUTANEOUS | Status: DC
  Administered 2015-03-13: 48 [IU] via SUBCUTANEOUS
  Filled 2015-03-13 (×2): qty 0.48

## 2015-03-13 MED ORDER — INSULIN GLARGINE 100 UNIT/ML ~~LOC~~ SOLN
48.0000 [IU] | Freq: Once | SUBCUTANEOUS | Status: AC
  Administered 2015-03-13: 48 [IU] via SUBCUTANEOUS
  Filled 2015-03-13: qty 0.48

## 2015-03-13 MED ORDER — BETAMETHASONE SOD PHOS & ACET 6 (3-3) MG/ML IJ SUSP
12.0000 mg | Freq: Once | INTRAMUSCULAR | Status: AC
  Administered 2015-03-13: 12 mg via INTRAMUSCULAR
  Filled 2015-03-13: qty 2

## 2015-03-13 MED ORDER — ZOLPIDEM TARTRATE 5 MG PO TABS
5.0000 mg | ORAL_TABLET | Freq: Every evening | ORAL | Status: DC | PRN
  Administered 2015-03-14 (×2): 5 mg via ORAL
  Filled 2015-03-13 (×2): qty 1

## 2015-03-13 MED ORDER — ZOLPIDEM TARTRATE 5 MG PO TABS: 5.0000 mg | ORAL_TABLET | Freq: Every evening | ORAL | Status: DC | PRN

## 2015-03-13 MED ORDER — BETAMETHASONE SOD PHOS & ACET 6 (3-3) MG/ML IJ SUSP: 12.0000 mg | Freq: Once | INTRAMUSCULAR | Status: DC

## 2015-03-13 MED ORDER — INSULIN ASPART 100 UNIT/ML ~~LOC~~ SOLN
25.0000 [IU] | Freq: Once | SUBCUTANEOUS | Status: AC
  Administered 2015-03-13: 25 [IU] via SUBCUTANEOUS

## 2015-03-13 MED ORDER — PRENATAL MULTIVITAMIN CH: 1.0000 | ORAL_TABLET | Freq: Every day | ORAL | Status: DC

## 2015-03-13 MED ORDER — INSULIN GLARGINE 100 UNIT/ML ~~LOC~~ SOLN: 48.0000 [IU] | Freq: Every day | SUBCUTANEOUS | Status: DC

## 2015-03-13 MED ORDER — LABETALOL HCL 5 MG/ML IV SOLN
20.0000 mg | INTRAVENOUS | Status: DC | PRN
Start: 2015-03-13 — End: 2015-03-16

## 2015-03-13 MED ORDER — HYDRALAZINE HCL 20 MG/ML IJ SOLN: 10.0000 mg | Freq: Once | INTRAMUSCULAR | Status: AC | PRN

## 2015-03-13 MED ORDER — INSULIN ASPART 100 UNIT/ML ~~LOC~~ SOLN
0.0000 [IU] | Freq: Four times a day (QID) | SUBCUTANEOUS | Status: DC
  Administered 2015-03-13: 3 [IU] via SUBCUTANEOUS
  Administered 2015-03-13 – 2015-03-15 (×4): 4 [IU] via SUBCUTANEOUS

## 2015-03-13 MED ORDER — ACETAMINOPHEN 325 MG PO TABS: 650.0000 mg | ORAL_TABLET | ORAL | Status: DC | PRN

## 2015-03-13 NOTE — H&P (Signed)
Sara [name] is a 29 y.o. female, G3P0111 at 32.2 weeks, presenting for elevated BP (home reading) and ongoing headache despite medications. Denies LOF or VB. Reports +FM and BH ctxs.  Dx'd w/ Type II DM 2 1/2- 3 yrs ago after last delivery - controlled w/ insulin.  Patient Active Problem List   Diagnosis Date Noted  . Gestational hypertension 03/13/2015  . Preterm contractions 03/13/2015  . Diabetes mellitus type 2, controlled, without complications 95/28/4132  . BMI 36.0-36.9,adult 03/12/2015  . Herpes simplex type 2 (HSV-2) infection affecting pregnancy, antepartum 03/12/2015  . Hemorrhoids 03/12/2015  . Latex allergy - rash 03/12/2015  . 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 History of migraine headaches - with aura 12/12/2014  . History of preterm delivery, currently pregnant 12/05/2014  . Generalized anxiety disorder 12/05/2014  . Anemia 12/05/2014  . Pilonidal abscess 09/30/2012  PP preeclampsia in last pregnancy - started on Magnesium, d/c'd home w/ Labetolol Allergy to Terazol 3 -- mild rash - pt cannot use Terazol 7 0.4% cream  History of present pregnancy: Patient entered care at 6.6 weeks.   EDC of 05/05/15 was established by sure LMP and that was c/w 6.3 wk scan.   Anatomy scan: 19.0 weeks, with spine anatomy not well seen due to position, and an anterior placenta. Small amount of color flow seen in the ventricle septum - scheduled for fetal echo.  Additional [place] evaluations:  6.3 wks - dating and viability: Viable IUP, c/w dating. 1 cm [place].  13.1 wks - first trimester screen: Singleton pregnancy. Anteverted uterus. Placenta appears anterior. NT = 1.5 mm - amnion seen. Adnexas WNLs. Previously seen [facility] not visualized on today's scan. 22.5 wks - f/u anatomy: Spine seen. Anatomy complete. 28.0 wks - Growth DM: EFW 2+10, 48th%tile. AFI 21.0 cm = 80th%tile - upper normal.  Significant prenatal events: Back  pain throughout pregnancy - comfort measures reviewed. Right knee pain around 14 1/2 wks - management options reviewed. Diagnosed w/ type 2 DM 2 1/2-3 yrs ago (after birth of only child) -- followed by Dr. Sheron Nightingale at [facility]. Sugars up and down between 8-9 wks. Was to see Endo at 20 wks -- did not keep appointment. Seen by Nutritionist on 10/01/14 and meals adjusted. MFM referral placed due to recs for Januvia use in pregnancy and seen on 10/06/14. Recs included changing insulin to Novolog w/ meals, FS 4x/day, ROB visit q 2 wks, detailed anatomy scan btw 18-20 wks, fetal echo btw 20-22 wks, serial growth scans in the third trimester, APFT beginning at 32 wks and delivery at 39 wks in the absence of other complications. Final adjustment of insulin = 35 units of Novolog immediately post meals and 48 units of Lantus at bedtime. Hx of elevated BPs - originally on Labetalol, but did not continue due to "normal" BPs. Normal baseline preeclampsia labs w/ the exception of plts at 466 - subsequent platelet levels normal. H/O PTB at 35 wks due to Gestational HTN - was diagnosed w/ preeclampsia within 24-48 hrs postpartum, readmitted and started on Magnesium Sulfate x 24 hrs. Labetalol prescribed and pt discontinued after having only taking a few weeks. Has not required any medication this pregnancy -- more preeclampsia labs completed and normal, to include 24-hr urine. 17P offered and declined. Treated for BV on 11/24/14. H/O migraine h/a, relieved only w/ T3 and an "occasional" coke - was referred to [facility]  and seen on 12/15/14. Recs included monitoring sxs and T3 prn. Applied for short term disability due to frequent headaches and loss of work. Seen in MAU at 18.3 wks for migraine management and noted to have elevated BPs at that time. Previously took Lisinopril for kidney protection due to DM. Was started on Labetalol this visit (18.3 wks) and reported "palpitations and dizziness" after first  dose -- pt d/c'd use and accepted risk of stopping med which was supported. Had one elevated BP in office (160/80) but reported BPs always elevated with migraines. Seen in MAU around 26+ wks for decreased FM. Non-compliant w/ bringing fingerstick log to office visits. fFN (neg) due to cramping/ongoing back pain at 29.6 wks. Declined flu shot. TWG 25 lbs thus far. Last evaluation: Office on 03/05/15 @ 31.2 wks by Dr. Raphael Gibney. FHR 158 bpm. BP 120/80. OB History    Gravida Para Term Preterm AB TAB SAB Ectopic Multiple Living   '3 1  1 1  1   1    ' 2008 @ 4 wks, SAB, no complications per pt report 07/11/2009 @ 35 wks, 13-hr labor, female infant, birthwt 6+5, Epidural, WHG - GDM and Gestational HTN "Gabriel" Past Medical History  Diagnosis Date  . Infected pilonidal cyst   . Anxiety     diagnosed in 2007  . Diabetes mellitus     type 2 - uncontrolled  . Hypertension     benign essential  . Migraine    Past Surgical History  Procedure Laterality Date  . No past surgeries     Family History: family history includes Diabetes in her maternal grandmother, mother, and paternal grandmother; Hypertension in her father; Migraines in her mother.HTN in her PGF, asthma in her brother and anxiety in her mother. Social History:  reports that she has never smoked. She does not have any smokeless tobacco history on file. She reports that she does not drink alcohol or use illicit drugs.Pt is African American, high school graduate and is employed as a Estate agent. She is of the [place] and will accept blood in an emergency. FOB Javontti Reynolds present and involved.   Prenatal Transfer Tool  Maternal Diabetes: Yes:  Diabetes Type:  Pre-pregnancy, Insulin/Medication controlled Genetic Screening: Normal ([place], first trimester screen and MSAFP) Maternal Ultrasounds/Referrals: Normal Fetal Ultrasounds or other Referrals:  Fetal echo, Referred to Materal Fetal Medicine  Maternal Substance Abuse:   No Significant Maternal Medications:  Meds include: Other: PNVs, FeS04, Novolog, Lantus, Zyrtec, Tyl #3 Significant Maternal Lab Results: Lab values include: Group B Strep negative, Other: HSV-2  TDAP NA Flu: Declined  ROS: As noted in HPI  Allergies  Allergen Reactions  . Septra Ds [Sulfamethoxazole-Trimethoprim] Hives and Itching  . Lobster [Shellfish Allergy] Itching    Throat itching  . Latex Rash     Dilation: 1 Exam by:: K. Trias CNM Blood pressure 140/70, pulse 107, temperature 98 F (36.7 C), temperature source Oral, resp. rate 20, height '5\' 2"'  (1.575 m), weight 91.627 kg (202 lb), last menstrual period 07/29/2014, SpO2 99 %.  Chest clear Heart RRR without murmur Abd gravid, NT, FH CWD Pelvic: 1/thick/BLT/med/posterior Ext: WNL  FHR: Category 2 - occ variables UCs: q 2 min  Prenatal labs: ABO, Rh: --/--/B POS, B POS (05/14 0515) Antibody: NEG (05/14 0515) Rubella:   Immune RPR: Nonreactive (11/09 0000)  HBsAg: Negative (11/09 0000)  HIV: Non-reactive (11/09 0000)  GBS:  Neg via PCR on 03/12/15 Sickle cell/Hgb electrophoresis: No record Pap: No record GC:  Neg 09/07/14 Chlamydia: Neg 09/07/14 Genetic screenings: Neg Glucola: 132 on 02/23/15 Other: Hbg A1C 8.2 at 6.6 wks and 7.3 at 17.6 wks. Creatinine 24 hr urine = 1906 on 09/07/14. Normal tox labs. Hgb 11.8 at NOB, 10.2 on 01/30/15 Results for orders placed or performed during the hospital encounter of 03/12/15 (from the past 72 hour(s))  Urinalysis, Routine w reflex microscopic     Status: Abnormal   Collection Time: 03/12/15  4:34 PM  Result Value Ref Range   Color, Urine YELLOW YELLOW   APPearance CLEAR CLEAR   Specific Gravity, Urine 1.015 1.005 - 1.030   pH 6.5 5.0 - 8.0   Glucose, UA >1000 (A) NEGATIVE mg/dL   Hgb urine dipstick NEGATIVE NEGATIVE   Bilirubin Urine NEGATIVE NEGATIVE   Ketones, ur 15 (A) NEGATIVE mg/dL   Protein, ur NEGATIVE NEGATIVE mg/dL   Urobilinogen, UA 0.2 0.0 - 1.0 mg/dL    Nitrite NEGATIVE NEGATIVE   Leukocytes, UA NEGATIVE NEGATIVE  Protein / creatinine ratio, urine     Status: None   Collection Time: 03/12/15  4:34 PM  Result Value Ref Range   Creatinine, Urine 97.00 mg/dL   Total Protein, Urine 12 mg/dL    Comment: NO NORMAL RANGE ESTABLISHED FOR THIS TEST   Protein Creatinine Ratio 0.12 0.00 - 0.15 mg/mg[Cre]  Urine microscopic-add on     Status: Abnormal   Collection Time: 03/12/15  4:34 PM  Result Value Ref Range   Squamous Epithelial / LPF RARE RARE   WBC, UA 0-2 <3 WBC/hpf   RBC / HPF 0-2 <3 RBC/hpf   Bacteria, UA FEW (A) RARE  CBC     Status: Abnormal   Collection Time: 03/12/15  5:05 PM  Result Value Ref Range   WBC 16.7 (H) 4.0 - 10.5 K/uL   RBC 4.00 3.87 - 5.11 MIL/uL   Hemoglobin 10.3 (L) 12.0 - 15.0 g/dL   HCT 31.9 (L) 36.0 - 46.0 %   MCV 79.8 78.0 - 100.0 fL   MCH 25.8 (L) 26.0 - 34.0 pg   MCHC 32.3 30.0 - 36.0 g/dL   RDW 14.2 11.5 - 15.5 %   Platelets 255 150 - 400 K/uL  Comprehensive metabolic panel     Status: Abnormal   Collection Time: 03/12/15  5:05 PM  Result Value Ref Range   Sodium 133 (L) 135 - 145 mmol/L   Potassium 3.8 3.5 - 5.1 mmol/L   Chloride 105 101 - 111 mmol/L   CO2 19 (L) 22 - 32 mmol/L   Glucose, Bld 121 (H) 65 - 99 mg/dL   BUN 6 6 - 20 mg/dL   Creatinine, Ser 0.50 0.44 - 1.00 mg/dL   Calcium 8.9 8.9 - 10.3 mg/dL   Total Protein 7.2 6.5 - 8.1 g/dL   Albumin 3.4 (L) 3.5 - 5.0 g/dL   AST 17 15 - 41 U/L   ALT 16 14 - 54 U/L   Alkaline Phosphatase 64 38 - 126 U/L   Total Bilirubin 0.2 (L) 0.3 - 1.2 mg/dL   GFR calc non Af Amer >60 >60 mL/min   GFR calc Af Amer >60 >60 mL/min    Comment: (NOTE) The eGFR has been calculated using the CKD EPI equation. This calculation has not been validated in all clinical situations. eGFR's persistently <60 mL/min signify possible Chronic Kidney Disease.    Anion gap 9 5 - 15  Uric acid     Status: None   Collection Time: 03/12/15  5:05  PM  Result Value Ref Range    Uric Acid, Serum 3.8 2.3 - 6.6 mg/dL  Lactate dehydrogenase     Status: None   Collection Time: 03/12/15  5:05 PM  Result Value Ref Range   LDH 139 98 - 192 U/L  Group B strep by PCR     Status: None   Collection Time: 03/12/15  9:02 PM  Result Value Ref Range   Group B strep by PCR NEGATIVE NEGATIVE  Protein, urine, 24 hour     Status: Abnormal   Collection Time: 03/13/15 12:45 AM  Result Value Ref Range   Urine Total Volume-UPROT 6500 mL   Collection Interval-UPROT 24 hours   Protein, Urine 6 5 - 24 mg/dL   Protein, 24H Urine 390 (H) <150 mg/day    Comment: Performed at [facility]  Glucose, capillary     Status: Abnormal   Collection Time: 03/13/15  3:58 AM  Result Value Ref Range   Glucose-Capillary 200 (H) 65 - 99 mg/dL  CBC     Status: Abnormal   Collection Time: 03/13/15  5:15 AM  Result Value Ref Range   WBC 20.7 (H) 4.0 - 10.5 K/uL   RBC 4.22 3.87 - 5.11 MIL/uL   Hemoglobin 10.9 (L) 12.0 - 15.0 g/dL   HCT 33.7 (L) 36.0 - 46.0 %   MCV 79.9 78.0 - 100.0 fL   MCH 25.8 (L) 26.0 - 34.0 pg   MCHC 32.3 30.0 - 36.0 g/dL   RDW 14.3 11.5 - 15.5 %   Platelets 281 150 - 400 K/uL  Uric acid     Status: None   Collection Time: 03/13/15  5:15 AM  Result Value Ref Range   Uric Acid, Serum 4.3 2.3 - 6.6 mg/dL  Lactate dehydrogenase     Status: None   Collection Time: 03/13/15  5:15 AM  Result Value Ref Range   LDH 162 98 - 192 U/L  Comprehensive metabolic panel     Status: Abnormal   Collection Time: 03/13/15  5:15 AM  Result Value Ref Range   Sodium 132 (L) 135 - 145 mmol/L   Potassium 4.3 3.5 - 5.1 mmol/L   Chloride 101 101 - 111 mmol/L   CO2 19 (L) 22 - 32 mmol/L   Glucose, Bld 265 (H) 65 - 99 mg/dL   BUN 6 6 - 20 mg/dL   Creatinine, Ser 0.63 0.44 - 1.00 mg/dL   Calcium 8.6 (L) 8.9 - 10.3 mg/dL   Total Protein 7.1 6.5 - 8.1 g/dL   Albumin 3.5 3.5 - 5.0 g/dL   AST 19 15 - 41 U/L   ALT 19 14 - 54 U/L   Alkaline Phosphatase 73 38 - 126 U/L   Total Bilirubin  0.6 0.3 - 1.2 mg/dL   GFR calc non Af Amer >60 >60 mL/min   GFR calc Af Amer >60 >60 mL/min    Comment: (NOTE) The eGFR has been calculated using the CKD EPI equation. This calculation has not been validated in all clinical situations. eGFR's persistently <60 mL/min signify possible Chronic Kidney Disease.    Anion gap 12 5 - 15  Magnesium     Status: Abnormal   Collection Time: 03/13/15  5:15 AM  Result Value Ref Range   Magnesium 3.5 (H) 1.7 - 2.4 mg/dL  Type and screen     Status: None   Collection Time: 03/13/15  5:15 AM  Result Value Ref Range   ABO/RH(D) B POS  Antibody Screen NEG    Sample Expiration 03/16/2015   ABO/Rh     Status: None   Collection Time: 03/13/15  5:15 AM  Result Value Ref Range   ABO/RH(D) B POS   Glucose, capillary     Status: Abnormal   Collection Time: 03/13/15  6:08 AM  Result Value Ref Range   Glucose-Capillary 211 (H) 65 - 99 mg/dL  Glucose, capillary     Status: Abnormal   Collection Time: 03/13/15 10:45 AM  Result Value Ref Range   Glucose-Capillary 140 (H) 65 - 99 mg/dL   Comment 1 Notify RN   Glucose, capillary     Status: None   Collection Time: 03/13/15  3:52 PM  Result Value Ref Range   Glucose-Capillary 76 65 - 99 mg/dL   Comment 1 Notify RN   Glucose, capillary     Status: Abnormal   Collection Time: 03/13/15  9:04 PM  Result Value Ref Range   Glucose-Capillary 114 (H) 65 - 99 mg/dL  CBC     Status: Abnormal   Collection Time: 03/14/15  5:05 AM  Result Value Ref Range   WBC 18.0 (H) 4.0 - 10.5 K/uL   RBC 3.97 3.87 - 5.11 MIL/uL   Hemoglobin 10.2 (L) 12.0 - 15.0 g/dL   HCT 31.6 (L) 36.0 - 46.0 %   MCV 79.6 78.0 - 100.0 fL   MCH 25.7 (L) 26.0 - 34.0 pg   MCHC 32.3 30.0 - 36.0 g/dL   RDW 14.5 11.5 - 15.5 %   Platelets 267 150 - 400 K/uL  Comprehensive metabolic panel     Status: Abnormal   Collection Time: 03/14/15  5:05 AM  Result Value Ref Range   Sodium 135 135 - 145 mmol/L   Potassium 4.0 3.5 - 5.1 mmol/L    Chloride 104 101 - 111 mmol/L   CO2 21 (L) 22 - 32 mmol/L   Glucose, Bld 147 (H) 65 - 99 mg/dL   BUN 7 6 - 20 mg/dL   Creatinine, Ser 0.64 0.44 - 1.00 mg/dL   Calcium 8.2 (L) 8.9 - 10.3 mg/dL   Total Protein 7.2 6.5 - 8.1 g/dL   Albumin 3.4 (L) 3.5 - 5.0 g/dL   AST 21 15 - 41 U/L   ALT 17 14 - 54 U/L   Alkaline Phosphatase 63 38 - 126 U/L   Total Bilirubin 0.2 (L) 0.3 - 1.2 mg/dL   GFR calc non Af Amer >60 >60 mL/min   GFR calc Af Amer >60 >60 mL/min    Comment: (NOTE) The eGFR has been calculated using the CKD EPI equation. This calculation has not been validated in all clinical situations. eGFR's persistently <60 mL/min signify possible Chronic Kidney Disease.    Anion gap 10 5 - 15  Lactate dehydrogenase     Status: None   Collection Time: 03/14/15  5:05 AM  Result Value Ref Range   LDH 144 98 - 192 U/L  Uric acid     Status: None   Collection Time: 03/14/15  5:05 AM  Result Value Ref Range   Uric Acid, Serum 4.3 2.3 - 6.6 mg/dL  Glucose, capillary     Status: Abnormal   Collection Time: 03/14/15  8:37 AM  Result Value Ref Range   Glucose-Capillary 48 (L) 65 - 99 mg/dL   Comment 1 Notify RN   Glucose, capillary     Status: None   Collection Time: 03/14/15  8:53 AM  Result Value Ref Range  Glucose-Capillary 85 65 - 99 mg/dL   Comment 1 Notify RN   Glucose, capillary     Status: Abnormal   Collection Time: 03/14/15 10:48 AM  Result Value Ref Range   Glucose-Capillary 137 (H) 65 - 99 mg/dL   Comment 1 Notify RN     Assessment/: IUP at 32.3 wks IDDM Gestational HTN PTCs w/ cervical change GBS neg H/O PTB at 35 wks due to HTN and GDM  Plan: Admit to Antenatal per consultation w/ Dr. Charlesetta Garibaldi Routine antenatal orders Fingersticks - fasting and 2hr pp Insulin management & SS coverage Magnesium Sulfate Strict I&O ADA diet BMZ 24hr urine Growth scan in am BP parameters Repeat tox labs with Magnesium level in am  Per Dr. Doran Stabler note on 03/11/15: "I  called the pt on her cell phone. We discussed her insulin dose. She is currently taking 35 units tid. She will add an additional 15-20 units 3 x per day w/ her snacks depending on her blood sugar values. She needs the equivalent of 150 units of insulin daily."   METTIE, ROYLANCE, MS 03/13/2015, 01:33

## 2015-03-13 NOTE — Progress Notes (Signed)
Diabetes Treatment Program Recommendations  ADA Standards of Care 2016 Diabetes in Pregnancy Target Glucose Ranges:  Fasting: 60 - 90 mg/dL Preprandial: 60 - 105 mg/dL 1 hr postprandial: Less than 140mg /dL (from first bite of meal) 2 hr postprandial: Less than 120 mg/dL (from first bit of meal)   Results for MAITRI, SCHNOEBELEN (MRN [id number]) as of 03/13/2015 08:54  Ref. Range 03/13/2015 03:58 03/13/2015 06:08  Glucose-Capillary Latest Ref Range: 65-99 mg/dL 200 (H) 211 (H)  Results for LAKEDRA, WASHINGTON (MRN [id number]) as of 03/13/2015 08:54  Ref. Range 03/13/2015 05:15  Glucose Latest Ref Range: 65-99 mg/dL 265 (H)   Outpatient Diabetes medications: Lantus 48 units QHS, Novolog 15-35 units QID Current orders for Inpatient glycemic control: Lantus 48 units QHS, Novolog 35 units TID with meals, Novolog 4-8 units TID (2 hour post prandial)  Inpatient Diabetes Program Recommendations Correction (SSI): Patient received Betamethasone 12 mg at 21:04 on 03/12/15 and is scheduled to get Betamethasone again this evening.  Steroids are contributing to hyperglycemia. Please discontinue current Novolog 4-8 units TID correction and use the Diabetic Pregnant Patient order set and order Novolog 0-24 units QID (fasting and 2 hour post prandial).  Thanks, Barnie Alderman, RN, MSN, CCRN, CDE Diabetes Coordinator Inpatient Diabetes Program [phone] (Team Pager from [place] to [place]) [phone] ([facility] office) [phone] [facility] office)

## 2015-03-13 NOTE — Progress Notes (Signed)
Patient ID: Sara [name], female   DOB: Apr 01, 1986, 29 y.o.   MRN: [id number] Sara [name] is a 29 y.o. [phone] at [redacted]w[redacted]d admitted for Elevated BPs  Subjective: Says HA has improved significantly and she has a h/o migraines.  She had preeclampsia with last pregnancy and delivered at 35wks after being admitted in 6cm.  Denies LOF or VB and says ctxs have decreased but she still has occas pressure.  She reports good FM.  Pt is concerned about her "sugars" going low and she is going to bring power of attorney papers to have on file because her mother is out of the country and her mother's best friend will be involved if necessary.  Objective: BP 137/87 mmHg  Pulse 113  Temp(Src) 98.1 F (36.7 C) (Oral)  Resp 18  Ht 5\' 2"  (1.575 m)  Wt 91.627 kg (202 lb)  BMI 36.94 kg/m2  SpO2 99%  LMP 07/29/2014 I/O last 3 completed shifts: In: 1278.3 [P.O.:390; I.V.:888.3] Out: -  Total I/O In: 1415.8 [P.O.:720; I.V.:695.8] Out: -   Physical Exam:  Gen: alert Chest/Lungs: cta bilaterally  Heart/Pulse: RRR  Abdomen: soft, gravid, nontender Uterine fundus: soft, nontender Skin & Color: warm and dry  Neurological: AOx3, DTRs 1+ EXT: negative Homan's b/l, SCDs are on  FHT:  130s, +accels, no decels, mod variability UC:   occasional SVE:   Dilation: 1 Exam by:: Irena Reichmann CNM  Labs: Lab Results  Component Value Date   WBC 20.7* 03/13/2015   HGB 10.9* 03/13/2015   HCT 33.7* 03/13/2015   MCV 79.9 03/13/2015   PLT 281 03/13/2015    GBS neg  Assessment and Plan: has Pilonidal abscess; Diabetes mellitus type 2, controlled, without complications; History of preterm delivery, currently pregnant; History of migraine headaches; Generalized anxiety disorder; Anemia; Gestational hypertension; Preterm contractions; BMI 36.0-36.9,adult; Herpes simplex type 2 (HSV-2) infection affecting pregnancy, antepartum; Hemorrhoids; Latex allergy - rash; Allergy to sulfa drugs - hives and itching; and  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  T2DM on insulin.  Cont current regimen.  Doubt pt will bottom out secondary to having received steroids.  Cont 4x/day CBG monitoring. GHTN - BPs stable on mg and PTL controlled on Mg as well.  Cont 24hr urine due to finish at 12:45am H/o HSV2 - consider starting valtrex qd for suppression, visitors in room so not currently discussed U/S today for EFW.  Pt has appt with u/s on Monday in the office.  The u/s can be canceled but if pt goes home tomorrow, she may still keep Monday appt depending on discharge provider. Pt undergoing course of BMZ Repeat labs in am Cont SCDs for DVT prophylaxis  Nilay Mangrum Y 03/13/2015, 12:01 PM

## 2015-03-13 NOTE — Progress Notes (Addendum)
BG 211 at 06:08 AM  Cvx 1/thick/BLT/med/posterior. Reports 5 ctxs over past hour.  Spoke w/ Dr. Charlesetta Garibaldi - continue pt's current insulin regimen of Novolog 35 Units tid and 48 units of Lantus at HS. Will maintain sliding scale coverage as well.  Continue Magnesium infusion.   Farrel Gordon, CNM 03/13/15, 06:30 AM

## 2015-03-13 NOTE — Progress Notes (Addendum)
S: Was asked to speak to pt re: her concerns that 10 units of Novolog would not be sufficient to put her blood sugar in the target range. Reports target goal for sugars = 115-120 w/ fastings below 90. States 10 units of Novolog would probably put her blood sugar around 180. +FM. Denies leaking or VB.   Starting to feel ctxs again. Stadol was helpful. Has been using T3 on a regular basis this pregnancy for her migraines.   O: Fingerstick 200  Today's Vitals   03/13/15 0229 03/13/15 0302 03/13/15 0334 03/13/15 0409  BP: 140/73 142/73  146/83  Pulse: 109 115  124  Temp:      TempSrc:      Resp: 18 18  20   Height:      Weight:      SpO2:      PainSc:   6    Cat 1 FHRT Toco: Irritability   Now on Magnesium 2 g/hr infusion  A: 29 yo G3P0111 @ 32.3 wks admitted for 1) Gestational Hypertension and 2) PTCs w/ cervical change Pre-gestational DM H/O migraine h/a  P: After reviewing previous notes in [place] and talking w/ pt, will give another 25 units of Novolog and recheck blood sugar at 06:00 as planned. We also talked through the fact that yesterday was an anomaly in that she was in MAU for several hours without eating and also received BMZ. She felt that her BG would have been over 200 regardless.   Will plan to recheck her cvx per her request at 06:00 as well due to starting to feel her ctxs again. Informed her that her ctxs have spaced out tremendously and we would like to limit cervical exams as much as possible. She verbalized understanding of this but still would like to be re-evaluated at the time of her next BG assessment.   Tox labs and Magnesium level this a.m.  Will update Dr. Charlesetta Garibaldi after cervical exam.   Mariem Skolnick, CNM 03/13/15, 05:28 AM

## 2015-03-13 NOTE — MAU Note (Signed)
Report called to Susie, RN in antenatal.  Patient to be admitted to room 158.

## 2015-03-14 DIAGNOSIS — O24113 Pre-existing diabetes mellitus, type 2, in pregnancy, third trimester: Secondary | ICD-10-CM | POA: Diagnosis not present

## 2015-03-14 LAB — GLUCOSE, CAPILLARY
GLUCOSE-CAPILLARY: 85 mg/dL (ref 65–99)
GLUCOSE-CAPILLARY: 109 mg/dL — AB (ref 65–99)
GLUCOSE-CAPILLARY: 77 mg/dL (ref 65–99)
Glucose-Capillary: 48 mg/dL — ABNORMAL LOW (ref 65–99)
Glucose-Capillary: 137 mg/dL — ABNORMAL HIGH (ref 65–99)
Glucose-Capillary: 140 mg/dL — ABNORMAL HIGH (ref 65–99)

## 2015-03-14 LAB — LACTATE DEHYDROGENASE: LDH: 144 U/L (ref 98–192)

## 2015-03-14 LAB — CBC
HCT: 31.6 % — ABNORMAL LOW (ref 36.0–46.0)
Hemoglobin: 10.2 g/dL — ABNORMAL LOW (ref 12.0–15.0)
MCH: 25.7 pg — ABNORMAL LOW (ref 26.0–34.0)
MCHC: 32.3 g/dL (ref 30.0–36.0)
MCV: 79.6 fL (ref 78.0–100.0)
PLATELETS: 267 10*3/uL (ref 150–400)
RBC: 3.97 MIL/uL (ref 3.87–5.11)
RDW: 14.5 % (ref 11.5–15.5)
WBC: 18 10*3/uL — ABNORMAL HIGH (ref 4.0–10.5)

## 2015-03-14 LAB — COMPREHENSIVE METABOLIC PANEL
ALT: 17 U/L (ref 14–54)
AST: 21 U/L (ref 15–41)
Albumin: 3.4 g/dL — ABNORMAL LOW (ref 3.5–5.0)
Alkaline Phosphatase: 63 U/L (ref 38–126)
Anion gap: 10 (ref 5–15)
BILIRUBIN TOTAL: 0.2 mg/dL — AB (ref 0.3–1.2)
BUN: 7 mg/dL (ref 6–20)
CO2: 21 mmol/L — AB (ref 22–32)
CREATININE: 0.64 mg/dL (ref 0.44–1.00)
Calcium: 8.2 mg/dL — ABNORMAL LOW (ref 8.9–10.3)
Chloride: 104 mmol/L (ref 101–111)
GFR calc non Af Amer: >60 mL/min (ref >60)
Glucose, Bld: 147 mg/dL — ABNORMAL HIGH (ref 65–99)
Potassium: 4 mmol/L (ref 3.5–5.1)
Sodium: 135 mmol/L (ref 135–145)
TOTAL PROTEIN: 7.2 g/dL (ref 6.5–8.1)

## 2015-03-14 LAB — PROTEIN, URINE, 24 HOUR
COLLECTION INTERVAL-UPROT: 24 h
PROTEIN, 24H URINE: 390 mg/d — AB (ref <150)
Protein, Urine: 6 mg/dL (ref 5–24)
Urine Total Volume-UPROT: 6500 mL

## 2015-03-14 LAB — URIC ACID: URIC ACID, SERUM: 4.3 mg/dL (ref 2.3–6.6)

## 2015-03-14 MED ORDER — BUTALBITAL-APAP-CAFFEINE 50-325-40 MG PO TABS
1.0000 | ORAL_TABLET | Freq: Four times a day (QID) | ORAL | Status: DC | PRN
  Administered 2015-03-14: 2 via ORAL
  Administered 2015-03-16: 1 via ORAL
  Filled 2015-03-14: qty 2
  Filled 2015-03-14: qty 1

## 2015-03-14 MED ORDER — MAGNESIUM SULFATE 50 % IJ SOLN: 2.0000 g/h | INTRAVENOUS | Status: DC

## 2015-03-14 MED ORDER — INSULIN GLARGINE 100 UNIT/ML ~~LOC~~ SOLN
40.0000 [IU] | Freq: Every day | SUBCUTANEOUS | Status: DC
  Administered 2015-03-14 – 2015-03-15 (×2): 40 [IU] via SUBCUTANEOUS
  Filled 2015-03-14 (×3): qty 0.4

## 2015-03-14 MED ORDER — POLYETHYLENE GLYCOL 3350 17 G PO PACK
17.0000 g | PACK | Freq: Every day | ORAL | Status: DC
  Administered 2015-03-14 – 2015-03-15 (×2): 17 g via ORAL
  Filled 2015-03-14 (×3): qty 1

## 2015-03-14 NOTE — Progress Notes (Signed)
Patient ID: Sara [name], female   DOB: Apr 14, 1986, 29 y.o.   MRN: [id number] Pt without complaints.  No leakage of fluid or VB.  Good FM.  Pt has a mild headache  BP 143/81 mmHg  Pulse 95  Temp(Src) 98 F (36.7 C) (Oral)  Resp 16  Ht '5\' 2"'  (1.575 m)  Wt 202 lb (91.627 kg)  BMI 36.94 kg/m2  SpO2 99%  LMP 07/29/2014  FHTS category 1  Toco irregular, every 10-45 minutes  Pt in NAD CV RRR Lungs CTAB abd  Gravid soft and NT GU no vb EXt no calf tenderness Results for orders placed or performed during the hospital encounter of 03/12/15 (from the past 72 hour(s))  Urinalysis, Routine w reflex microscopic     Status: Abnormal   Collection Time: 03/12/15  4:34 PM  Result Value Ref Range   Color, Urine YELLOW YELLOW   APPearance CLEAR CLEAR   Specific Gravity, Urine 1.015 1.005 - 1.030   pH 6.5 5.0 - 8.0   Glucose, UA >1000 (A) NEGATIVE mg/dL   Hgb urine dipstick NEGATIVE NEGATIVE   Bilirubin Urine NEGATIVE NEGATIVE   Ketones, ur 15 (A) NEGATIVE mg/dL   Protein, ur NEGATIVE NEGATIVE mg/dL   Urobilinogen, UA 0.2 0.0 - 1.0 mg/dL   Nitrite NEGATIVE NEGATIVE   Leukocytes, UA NEGATIVE NEGATIVE  Protein / creatinine ratio, urine     Status: None   Collection Time: 03/12/15  4:34 PM  Result Value Ref Range   Creatinine, Urine 97.00 mg/dL   Total Protein, Urine 12 mg/dL    Comment: NO NORMAL RANGE ESTABLISHED FOR THIS TEST   Protein Creatinine Ratio 0.12 0.00 - 0.15 mg/mg[Cre]  Urine microscopic-add on     Status: Abnormal   Collection Time: 03/12/15  4:34 PM  Result Value Ref Range   Squamous Epithelial / LPF RARE RARE   WBC, UA 0-2 <3 WBC/hpf   RBC / HPF 0-2 <3 RBC/hpf   Bacteria, UA FEW (A) RARE  CBC     Status: Abnormal   Collection Time: 03/12/15  5:05 PM  Result Value Ref Range   WBC 16.7 (H) 4.0 - 10.5 K/uL   RBC 4.00 3.87 - 5.11 MIL/uL   Hemoglobin 10.3 (L) 12.0 - 15.0 g/dL   HCT 31.9 (L) 36.0 - 46.0 %   MCV 79.8 78.0 - 100.0 fL   MCH 25.8 (L) 26.0 - 34.0 pg    MCHC 32.3 30.0 - 36.0 g/dL   RDW 14.2 11.5 - 15.5 %   Platelets 255 150 - 400 K/uL  Comprehensive metabolic panel     Status: Abnormal   Collection Time: 03/12/15  5:05 PM  Result Value Ref Range   Sodium 133 (L) 135 - 145 mmol/L   Potassium 3.8 3.5 - 5.1 mmol/L   Chloride 105 101 - 111 mmol/L   CO2 19 (L) 22 - 32 mmol/L   Glucose, Bld 121 (H) 65 - 99 mg/dL   BUN 6 6 - 20 mg/dL   Creatinine, Ser 0.50 0.44 - 1.00 mg/dL   Calcium 8.9 8.9 - 10.3 mg/dL   Total Protein 7.2 6.5 - 8.1 g/dL   Albumin 3.4 (L) 3.5 - 5.0 g/dL   AST 17 15 - 41 U/L   ALT 16 14 - 54 U/L   Alkaline Phosphatase 64 38 - 126 U/L   Total Bilirubin 0.2 (L) 0.3 - 1.2 mg/dL   GFR calc non Af Amer >60 >60 mL/min   GFR calc Af  Amer >60 >60 mL/min    Comment: (NOTE) The eGFR has been calculated using the CKD EPI equation. This calculation has not been validated in all clinical situations. eGFR's persistently <60 mL/min signify possible Chronic Kidney Disease.    Anion gap 9 5 - 15  Uric acid     Status: None   Collection Time: 03/12/15  5:05 PM  Result Value Ref Range   Uric Acid, Serum 3.8 2.3 - 6.6 mg/dL  Lactate dehydrogenase     Status: None   Collection Time: 03/12/15  5:05 PM  Result Value Ref Range   LDH 139 98 - 192 U/L  Group B strep by PCR     Status: None   Collection Time: 03/12/15  9:02 PM  Result Value Ref Range   Group B strep by PCR NEGATIVE NEGATIVE  Protein, urine, 24 hour     Status: Abnormal   Collection Time: 03/13/15 12:45 AM  Result Value Ref Range   Urine Total Volume-UPROT 6500 mL   Collection Interval-UPROT 24 hours   Protein, Urine 6 5 - 24 mg/dL   Protein, 24H Urine 390 (H) <150 mg/day    Comment: Performed at [facility]  Glucose, capillary     Status: Abnormal   Collection Time: 03/13/15  3:58 AM  Result Value Ref Range   Glucose-Capillary 200 (H) 65 - 99 mg/dL  CBC     Status: Abnormal   Collection Time: 03/13/15  5:15 AM  Result Value Ref Range   WBC 20.7 (H)  4.0 - 10.5 K/uL   RBC 4.22 3.87 - 5.11 MIL/uL   Hemoglobin 10.9 (L) 12.0 - 15.0 g/dL   HCT 33.7 (L) 36.0 - 46.0 %   MCV 79.9 78.0 - 100.0 fL   MCH 25.8 (L) 26.0 - 34.0 pg   MCHC 32.3 30.0 - 36.0 g/dL   RDW 14.3 11.5 - 15.5 %   Platelets 281 150 - 400 K/uL  Uric acid     Status: None   Collection Time: 03/13/15  5:15 AM  Result Value Ref Range   Uric Acid, Serum 4.3 2.3 - 6.6 mg/dL  Lactate dehydrogenase     Status: None   Collection Time: 03/13/15  5:15 AM  Result Value Ref Range   LDH 162 98 - 192 U/L  Comprehensive metabolic panel     Status: Abnormal   Collection Time: 03/13/15  5:15 AM  Result Value Ref Range   Sodium 132 (L) 135 - 145 mmol/L   Potassium 4.3 3.5 - 5.1 mmol/L   Chloride 101 101 - 111 mmol/L   CO2 19 (L) 22 - 32 mmol/L   Glucose, Bld 265 (H) 65 - 99 mg/dL   BUN 6 6 - 20 mg/dL   Creatinine, Ser 0.63 0.44 - 1.00 mg/dL   Calcium 8.6 (L) 8.9 - 10.3 mg/dL   Total Protein 7.1 6.5 - 8.1 g/dL   Albumin 3.5 3.5 - 5.0 g/dL   AST 19 15 - 41 U/L   ALT 19 14 - 54 U/L   Alkaline Phosphatase 73 38 - 126 U/L   Total Bilirubin 0.6 0.3 - 1.2 mg/dL   GFR calc non Af Amer >60 >60 mL/min   GFR calc Af Amer >60 >60 mL/min    Comment: (NOTE) The eGFR has been calculated using the CKD EPI equation. This calculation has not been validated in all clinical situations. eGFR's persistently <60 mL/min signify possible Chronic Kidney Disease.    Anion gap 12  5 - 15  Magnesium     Status: Abnormal   Collection Time: 03/13/15  5:15 AM  Result Value Ref Range   Magnesium 3.5 (H) 1.7 - 2.4 mg/dL  Type and screen     Status: None   Collection Time: 03/13/15  5:15 AM  Result Value Ref Range   ABO/RH(D) B POS    Antibody Screen NEG    Sample Expiration 03/16/2015   ABO/Rh     Status: None   Collection Time: 03/13/15  5:15 AM  Result Value Ref Range   ABO/RH(D) B POS   Glucose, capillary     Status: Abnormal   Collection Time: 03/13/15  6:08 AM  Result Value Ref Range    Glucose-Capillary 211 (H) 65 - 99 mg/dL  Glucose, capillary     Status: Abnormal   Collection Time: 03/13/15 10:45 AM  Result Value Ref Range   Glucose-Capillary 140 (H) 65 - 99 mg/dL   Comment 1 Notify RN   Glucose, capillary     Status: None   Collection Time: 03/13/15  3:52 PM  Result Value Ref Range   Glucose-Capillary 76 65 - 99 mg/dL   Comment 1 Notify RN   Glucose, capillary     Status: Abnormal   Collection Time: 03/13/15  9:04 PM  Result Value Ref Range   Glucose-Capillary 114 (H) 65 - 99 mg/dL  CBC     Status: Abnormal   Collection Time: 03/14/15  5:05 AM  Result Value Ref Range   WBC 18.0 (H) 4.0 - 10.5 K/uL   RBC 3.97 3.87 - 5.11 MIL/uL   Hemoglobin 10.2 (L) 12.0 - 15.0 g/dL   HCT 31.6 (L) 36.0 - 46.0 %   MCV 79.6 78.0 - 100.0 fL   MCH 25.7 (L) 26.0 - 34.0 pg   MCHC 32.3 30.0 - 36.0 g/dL   RDW 14.5 11.5 - 15.5 %   Platelets 267 150 - 400 K/uL  Comprehensive metabolic panel     Status: Abnormal   Collection Time: 03/14/15  5:05 AM  Result Value Ref Range   Sodium 135 135 - 145 mmol/L   Potassium 4.0 3.5 - 5.1 mmol/L   Chloride 104 101 - 111 mmol/L   CO2 21 (L) 22 - 32 mmol/L   Glucose, Bld 147 (H) 65 - 99 mg/dL   BUN 7 6 - 20 mg/dL   Creatinine, Ser 0.64 0.44 - 1.00 mg/dL   Calcium 8.2 (L) 8.9 - 10.3 mg/dL   Total Protein 7.2 6.5 - 8.1 g/dL   Albumin 3.4 (L) 3.5 - 5.0 g/dL   AST 21 15 - 41 U/L   ALT 17 14 - 54 U/L   Alkaline Phosphatase 63 38 - 126 U/L   Total Bilirubin 0.2 (L) 0.3 - 1.2 mg/dL   GFR calc non Af Amer >60 >60 mL/min   GFR calc Af Amer >60 >60 mL/min    Comment: (NOTE) The eGFR has been calculated using the CKD EPI equation. This calculation has not been validated in all clinical situations. eGFR's persistently <60 mL/min signify possible Chronic Kidney Disease.    Anion gap 10 5 - 15  Lactate dehydrogenase     Status: None   Collection Time: 03/14/15  5:05 AM  Result Value Ref Range   LDH 144 98 - 192 U/L  Uric acid     Status: None    Collection Time: 03/14/15  5:05 AM  Result Value Ref Range   Uric Acid, Serum  4.3 2.3 - 6.6 mg/dL  Glucose, capillary     Status: Abnormal   Collection Time: 03/14/15  8:37 AM  Result Value Ref Range   Glucose-Capillary 48 (L) 65 - 99 mg/dL   Comment 1 Notify RN   Glucose, capillary     Status: None   Collection Time: 03/14/15  8:53 AM  Result Value Ref Range   Glucose-Capillary 85 65 - 99 mg/dL   Comment 1 Notify RN   Glucose, capillary     Status: Abnormal   Collection Time: 03/14/15 10:48 AM  Result Value Ref Range   Glucose-Capillary 137 (H) 65 - 99 mg/dL   Comment 1 Notify RN   Glucose, capillary     Status: Abnormal   Collection Time: 03/14/15  4:17 PM  Result Value Ref Range   Glucose-Capillary 140 (H) 65 - 99 mg/dL   Comment 1 Notify RN     Assessment and Plan [redacted]w[redacted]d Mild preeclampsia.  Obtain MFM consult regarding gestational age of recommendation for delivery. Pt with a mild headache.  Will give tylenol Wean magnesium.  Only restart if if pt is in labor for neuroprophylaxsis  NICU consult IDDM decrease lantus per recommendation.  Decrease novolog based on intake Continue current care.  Check labs twice a week

## 2015-03-14 NOTE — Progress Notes (Signed)
Inpatient Diabetes Program Recommendations Diabetes Treatment Program Recommendations  ADA Standards of Care 2016 Diabetes in Pregnancy Target Glucose Ranges:  Fasting: 60 - 90 mg/dL Preprandial: 60 - 105 mg/dL 1 hr postprandial: Less than 140mg /dL (from first bite of meal) 2 hr postprandial: Less than 120 mg/dL (from first bit of meal)  Results for Sara [name], Sara [name] (MRN [id number]) as of 03/14/2015 14:39  Ref. Range 03/13/2015 03:58 03/13/2015 06:08 03/13/2015 10:45 03/13/2015 15:52 03/13/2015 21:04 03/14/2015 08:37 03/14/2015 08:53 03/14/2015 10:48  Glucose-Capillary Latest Ref Range: 65-99 mg/dL 200 (H) 211 (H) 140 (H) 76 114 (H) 48 (L) 85 137 (H)   Current orders for Inpatient glycemic control: Lantus 48 units QHS, Novolog 35 units TID with meals for meal coverage, Novolog 0-24 units QID (fasting and 2H post prandial)  Inpatient Diabetes Program Recommendations Insulin - Basal: Glucose 48 mg/dl this morning at 8:37 am. Please consider decreasing Lantus to 40 units QHS. Insulin - Meal Coverage: May want to consider decreasing meal coverage to Novolog 0-20 units TID with meals (1 unit for every 5 grams of carbs).  Thanks, Barnie Alderman, RN, MSN, CCRN, CDE Diabetes Coordinator Inpatient Diabetes Program [phone] (Team Pager from [place] to [place]) [phone] ([facility] office) [phone] [facility] office)

## 2015-03-14 NOTE — Progress Notes (Signed)
Patient complained of seeing spots and she has a mild headache.  She has several visitors in the room and has not rested today.  I encouraged her to turn the lights down and rest on her side.

## 2015-03-14 NOTE — Consult Note (Signed)
Neonatology Consult  Note:  At the request of the patients obstetrician Dr. Charlesetta Garibaldi I met with Sara [name] - as well as husband, Godmother and maternal mother (via Skype).  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 Preterm labor, history of HSV-2, history of prior preterm delivery at 35 weeks (per mother he was in the NICU x 1 week).  She is s/p BMZ 5/13 - 5/14.  Currently on magnesium sulfate.    We reviewed initial delivery room management, including CPAP, [place], and low but certainly possible need for intubation for surfactant administration.  We discussed feeding immaturity and need for full po intake with multiple days of good weight gain and no apnea or bradycardia before discharge.  We reviewed increased risk of jaundice, infection, and temperature instability.   Discussed likely length of stay.  Thank you for allowing [place] to participate in her care.    Higinio Roger, DO  Neonatologist   The total length of face-to-face or floor / unit time for this encounter was 20 minutes.  Counseling and / or coordination of care was greater than fifty percent of the time.

## 2015-03-14 NOTE — Progress Notes (Signed)
Magnesium stopped at 03/14/2015 @2100 

## 2015-03-14 NOTE — Progress Notes (Signed)
This note also relates to the following rows which could not be included: [facility] Lab Component - View only - Cannot attach notes to extension rows Patient given apple juice

## 2015-03-15 LAB — CBC WITH DIFFERENTIAL/PLATELET
BASOS ABS: 0 10*3/uL (ref 0.0–0.1)
BASOS PCT: 0 % (ref 0–1)
EOS PCT: 0 % (ref 0–5)
Eosinophils Absolute: 0 10*3/uL (ref 0.0–0.7)
HEMATOCRIT: 30 % — AB (ref 36.0–46.0)
Hemoglobin: 9.7 g/dL — ABNORMAL LOW (ref 12.0–15.0)
Lymphocytes Relative: 16 % (ref 12–46)
Lymphs Abs: 2.5 10*3/uL (ref 0.7–4.0)
MCH: 26.3 pg (ref 26.0–34.0)
MCHC: 32.3 g/dL (ref 30.0–36.0)
MCV: 81.3 fL (ref 78.0–100.0)
Monocytes Absolute: 2.4 10*3/uL — ABNORMAL HIGH (ref 0.1–1.0)
Monocytes Relative: 15 % — ABNORMAL HIGH (ref 3–12)
Neutro Abs: 11.3 10*3/uL — ABNORMAL HIGH (ref 1.7–7.7)
Neutrophils Relative %: 69 % (ref 43–77)
Platelets: 248 10*3/uL (ref 150–400)
RBC: 3.69 MIL/uL — ABNORMAL LOW (ref 3.87–5.11)
RDW: 14.7 % (ref 11.5–15.5)
WBC: 16.3 10*3/uL — ABNORMAL HIGH (ref 4.0–10.5)

## 2015-03-15 LAB — COMPREHENSIVE METABOLIC PANEL
ALK PHOS: 52 U/L (ref 38–126)
ALT: 16 U/L (ref 14–54)
AST: 16 U/L (ref 15–41)
Albumin: 3 g/dL — ABNORMAL LOW (ref 3.5–5.0)
Anion gap: 8 (ref 5–15)
BUN: 9 mg/dL (ref 6–20)
CO2: 25 mmol/L (ref 22–32)
Calcium: 8.6 mg/dL — ABNORMAL LOW (ref 8.9–10.3)
Chloride: 104 mmol/L (ref 101–111)
Creatinine, Ser: 0.59 mg/dL (ref 0.44–1.00)
GLUCOSE: 98 mg/dL (ref 65–99)
POTASSIUM: 4 mmol/L (ref 3.5–5.1)
SODIUM: 137 mmol/L (ref 135–145)
TOTAL PROTEIN: 6.5 g/dL (ref 6.5–8.1)
Total Bilirubin: 0.2 mg/dL — ABNORMAL LOW (ref 0.3–1.2)

## 2015-03-15 LAB — HEMOGLOBIN A1C
HEMOGLOBIN A1C: 8.2 % — AB (ref 4.8–5.6)
Mean Plasma Glucose: 189 mg/dL

## 2015-03-15 LAB — GLUCOSE, CAPILLARY
GLUCOSE-CAPILLARY: 72 mg/dL (ref 65–99)
GLUCOSE-CAPILLARY: 81 mg/dL (ref 65–99)
Glucose-Capillary: 99 mg/dL (ref 65–99)
Glucose-Capillary: 63 mg/dL — ABNORMAL LOW (ref 65–99)
Glucose-Capillary: 153 mg/dL — ABNORMAL HIGH (ref 65–99)

## 2015-03-15 LAB — LACTATE DEHYDROGENASE: LDH: 133 U/L (ref 98–192)

## 2015-03-15 LAB — URIC ACID: Uric Acid, Serum: 4.5 mg/dL (ref 2.3–6.6)

## 2015-03-15 NOTE — Progress Notes (Signed)
Hospital day # 2 pregnancy at [redacted]w[redacted]d: gestational hypertension and Type 2 diabetes  S: well, reports good fetal activity.      No headache or visual symptoms. Migraine at admission was similar to her usual migraines which she gets 3-4 times per week      Contractions:averag 4 per hour      Vaginal bleeding:none now       Vaginal discharge: no significant change  O: BP 143/85 mmHg  Pulse 100  Temp(Src) 98 F (36.7 C) (Oral)  Resp 18  Ht 5\' 2"  (1.575 m)  Wt 202 lb (91.627 kg)  BMI 36.94 kg/m2  SpO2 99%  LMP 07/29/2014      Fetal tracings:Category 1  reviewed and reassuring      Uterus non-tender      Extremities: no significant edema and no signs of DVT, normal DTR and no clonus  PIH labs: CMP and CBC normal, PCR 0.12, 24 hour urine with 390 mg protein in 24 hours  A:  1. [redacted]w[redacted]d with gestational hypertension without evidence of pre-eclampsia per strict criteria ( but with proteinuria of 390 mg per 24 hours )                 Fetal tracings reassuring                  BP not requiring meds at this time   2. Preterm labor resolved: s/p completed BMZ with short course of Magnesium sulfate   3. Type 2 diabetes well controlled with Lantus 40 mg daily and Novolog 35 units with meals     P: awaiting MFM consultation: outpatient management?  Corbyn Wildey A  MD 03/15/2015 8:41 AM

## 2015-03-15 NOTE — Progress Notes (Addendum)
Initial Nutrition Assessment  DOCUMENTATION CODES:   n/a  INTERVENTION: Carbohydrate Modified Gestational Diet - reinforcement of diet parameters and adherence to diet    NUTRITION DIAGNOSIS:  Increased nutrient needs related to  (pregnancy and fetal growth requirements) as evidenced by  ([redacted] weeks gestation).  GOAL:  Patient will meet greater than or equal to 90% of their needs   MONITOR:  Weight trends  REASON FOR ASSESSMENT: [place] for diet comprehension      ASSESSMENT: Pt at 32 5/7 weeks ,w/ HTN GDM. pre-pregnancy weight 177 lbs, 25 lb weight gain. Weight gain goals 11-20 lbs per pre-preg BMI  Pt completed diet education 09/30/14. States she has understanding of diet, just chooses to not be perfectly compliant at home. Reinforced reasons/ implications to fetus, for adherence. Reviewed CHO count per meal and snack, glucose goals. Stressed protein intake each meal,  Increased servings of vegetables when possible. Pt did not wish another written copy of diet education  Height:  Ht Readings from Last 1 Encounters:  03/13/15 5\' 2"  (1.575 m)    Weight:  Wt Readings from Last 1 Encounters:  03/13/15 202 lb (91.627 kg)    Ideal Body Weight:     Wt Readings from Last 10 Encounters:  03/13/15 202 lb (91.627 kg)  12/15/14 188 lb 6.4 oz (85.458 kg)  12/04/14 187 lb 6.4 oz (85.004 kg)  09/30/12 171 lb 8 oz (77.792 kg)  09/10/12 175 lb (79.379 kg)    BMI:  Body mass index is 36.94 kg/(m^2).  Estimated Nutritional Needs:  Kcal:  2000 - 2200  Protein:  85-95 g  Fluid:  2.3L   Diet Order:  Diet gestational carb mod Room service appropriate?: Yes; Fluid consistency:: Thin  EDUCATION NEEDS: addressed     Intake/Output Summary (Last 24 hours) at 03/15/15 1330 Last data filed at 03/15/15 1100  Gross per 24 hour  Intake 2834.42 ml  Output   2550 ml  Net 284.42 ml    Weyman Rodney M.Fredderick Severance LDN Neonatal Nutrition Support Specialist/RD III Pager  [phone]

## 2015-03-16 ENCOUNTER — Encounter (HOSPITAL_COMMUNITY): Payer: Self-pay | Admitting: Obstetrics and Gynecology

## 2015-03-16 ENCOUNTER — Inpatient Hospital Stay (HOSPITAL_COMMUNITY): Payer: BLUE CROSS/BLUE SHIELD

## 2015-03-16 DIAGNOSIS — O1493 Unspecified pre-eclampsia, third trimester: Secondary | ICD-10-CM | POA: Diagnosis present

## 2015-03-16 LAB — GLUCOSE, CAPILLARY
GLUCOSE-CAPILLARY: 99 mg/dL (ref 65–99)
GLUCOSE-CAPILLARY: 62 mg/dL — AB (ref 65–99)
GLUCOSE-CAPILLARY: 76 mg/dL (ref 65–99)

## 2015-03-16 LAB — PROTEIN / CREATININE RATIO, URINE
CREATININE, URINE: 153 mg/dL
PROTEIN CREATININE RATIO: 0.1 mg/mg{creat} (ref 0.00–0.15)
TOTAL PROTEIN, URINE: 15 mg/dL

## 2015-03-16 LAB — TYPE AND SCREEN
ABO/RH(D): B POS
Antibody Screen: NEGATIVE

## 2015-03-16 LAB — URIC ACID: URIC ACID, SERUM: 4 mg/dL (ref 2.3–6.6)

## 2015-03-16 LAB — LACTATE DEHYDROGENASE: LDH: 120 U/L (ref 98–192)

## 2015-03-16 MED ORDER — SODIUM CHLORIDE 0.9 % IJ SOLN
3.0000 mL | Freq: Two times a day (BID) | INTRAMUSCULAR | Status: DC
  Administered 2015-03-16: 3 mL via INTRAVENOUS

## 2015-03-16 MED ORDER — ACETAMINOPHEN-CODEINE #3 300-30 MG PO TABS: 1.0000 | ORAL_TABLET | Freq: Four times a day (QID) | ORAL | Status: DC | PRN

## 2015-03-16 MED ORDER — BUTALBITAL-APAP-CAFFEINE 50-325-40 MG PO TABS: 1.0000 | ORAL_TABLET | Freq: Four times a day (QID) | ORAL | Status: DC | PRN

## 2015-03-16 MED ORDER — INSULIN GLARGINE 100 UNIT/ML ~~LOC~~ SOLN: 40.0000 [IU] | Freq: Every day | SUBCUTANEOUS | Status: DC

## 2015-03-16 MED ORDER — FERROUS SULFATE 325 (65 FE) MG PO TABS: 325.0000 mg | ORAL_TABLET | Freq: Every day | ORAL | Status: DC

## 2015-03-16 MED ORDER — INSULIN ASPART 100 UNIT/ML ~~LOC~~ SOLN: 35.0000 [IU] | Freq: Three times a day (TID) | SUBCUTANEOUS | Status: DC

## 2015-03-16 NOTE — Consult Note (Signed)
Maternal Fetal Medicine Consultation  Requesting Provider(s): Crawford Givens, MD  Reason for consultation: Preeclampsia without severe features for management recommendations  HPI: Sara [name] is a 29 yo G3P1001, EDD 05/05/2015 who is currently at [place] 6d admitted due to elevated blood pressures. Sara [name] was admitted on 5/14 due to elevated blood pressures in the 150-160/90-100 range.  Her blood pressures improved on bedrest only and she is not currently on medications.  Preeclampsia labs were within normal limits except for a 24-hr urine protein of 390 mg/24 hrs.  Her prenatal chart reports that her baseline 24-hr urine protein was WNL. Her blood pressures over the last 24-48 hours have all been in the 120-130/70-80 range.  Sara [name] reports a history of migraine headaches and feels that her blood pressures worsen during her headaches only.  She was briefly treated with a course of Labetalol earlier this pregnancy which was since stopped.  She reports a "brief" history of elevated blood pressures following the delivery of her child, but was never started on oral anti-hypertensive agents.  Sara [name] reports a history of a 35 week preterm delivery.  Her blood pressures were elevated prior to delivery but she was not treated with Magnesium prior to delivery.  She was later admitted post partum with preeclampsia that required 24-hr of Magnesium sulfate at that time.  Her prenatal course has also been complicated by type 2 diabetes - she is currently on Lantis 40 mg daily and Novolog 35 units with each meal, 15 units with snacks.  She is without complaints today.  The fetus is active.  She denies HA, visual changes or RUQ pain.  Most recent ultrasound: 5/14- EFW at the 64th %tile, normal amniotic fluid volume.  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  OB History: OB History    Gravida Para Term Preterm AB TAB SAB Ectopic Multiple Living   3 1  1 1  1   1       PMH:  Past  Medical History  Diagnosis Date  . Infected pilonidal cyst   . Anxiety     diagnosed in 2007  . Diabetes mellitus     type 2 - uncontrolled  . Hypertension     benign essential  . Migraine     PSH:  Past Surgical History  Procedure Laterality Date  . No past surgeries     Meds:  Scheduled Meds: . docusate sodium  100 mg Oral Daily  . ferrous sulfate  325 mg Oral BID WC  . insulin aspart  0-24 Units Subcutaneous QID  . insulin aspart  35 Units Subcutaneous TID WC  . insulin glargine  40 Units Subcutaneous QHS  . polyethylene glycol  17 g Oral Daily  . prenatal multivitamin  1 tablet Oral Q1200   Continuous Infusions: . lactated ringers 75 mL/hr at 03/14/15 1533  . magnesium sulfate     PRN Meds:.acetaminophen, acetaminophen-codeine, butalbital-acetaminophen-caffeine, calcium carbonate, labetalol, zolpidem  Allergies:  Allergies  Allergen Reactions  . Septra Ds [Sulfamethoxazole-Trimethoprim] Hives and Itching  . Lobster [Shellfish Allergy] Itching    Throat itching  . Latex Rash    FH:  Family History  Problem Relation Age of Onset  . Diabetes Mother   . Diabetes Maternal Grandmother   . Hypertension Father   . Diabetes Paternal Grandmother   . Migraines Mother     Soc:  History   Social History  . Marital Status: Divorced    Spouse Name: N/A  .  Number of Children: 1  . Years of Education: 12   Occupational History  . Not on file.   Social History Main Topics  . Smoking status: Never Smoker   . Smokeless tobacco: Not on file  . Alcohol Use: No     Comment: drank 1-2 drinks a week before pregnancy  . Drug Use: No  . Sexual Activity: Yes   Other Topics Concern  . Not on file   Social History Narrative   Lives with boyfriend and child   Is pregnant (20weeks) with second child   1st born is a boy and her second is a boy   Drinks one cup of coffee a day   PE:   Filed Vitals:   03/16/15 0748  BP: 124/68  Pulse: 96  Temp: 98 F (36.7 C)   Resp:    Labs: CBC    Component Value Date/Time   WBC 16.3* 03/15/2015 0604   RBC 3.69* 03/15/2015 0604   HGB 9.7* 03/15/2015 0604   HCT 30.0* 03/15/2015 0604   PLT 248 03/15/2015 0604   MCV 81.3 03/15/2015 0604   MCH 26.3 03/15/2015 0604   MCHC 32.3 03/15/2015 0604   RDW 14.7 03/15/2015 0604   LYMPHSABS 2.5 03/15/2015 0604   MONOABS 2.4* 03/15/2015 0604   EOSABS 0.0 03/15/2015 0604   BASOSABS 0.0 03/15/2015 0604    A/P: 1) Single IUP a 32w 6d  2) Preeclampsia without severe features - over the last 24-48 hrs, blood pressures have improved without medications.  Normal preeclampsia labs except for 24-hr urine protein of 390 mg.  3) Type 2 diabetes on Lantis and Novolog  Recommendations: 1) Sara [name] has been stable over the last 24-48 hrs with normal labs and blood pressures.  Feel that she may be discharged with close outpatient management. 2) Recommend 2x weekly clinic visits for NSTs and blood pressure checks as well as weekly AFIs 3) Serial ultrasounds for growth every 3-4 weeks 5) Weekly preeclampsia labs 6) Activity limitations - modified bedrest at home 7) Would re-admit if blood pressure or laboratory values worsen.  8) If otherwise stable, would recommend delivery at 37 weeks due to preeclampsia without severe features.   Thank you for the opportunity to be a part of the care of Sara [name]. Please contact our office if we can be of further assistance.   I spent approximately 30 minutes with this patient with over 50% of time spent in face-to-face counseling.  Benjaman Lobe, MD Maternal Fetal Medicine

## 2015-03-16 NOTE — Discharge Instructions (Signed)
Office will call you to schedule the following appts/care: Office visit 2x/week (Tuesday and Friday, or Monday and Thursday) Weekly [place] for fluid volume Weekly labs for liver and kidney function [place] for baby's growth every 3-4 weeks Modified bedrest--rest, no work or significant activity, no sex Delivery will be planned for 37 weeks, due to pre-eclampsia.  Continue insulin as ordered:   Novalog 35 units three times per day Lantus 40 units at bedtime Sliding scale as needed for high blood sugar values.   Preeclampsia and Eclampsia Preeclampsia is a serious condition that develops only during pregnancy. It is also called toxemia of pregnancy. This condition causes high blood pressure along with other symptoms, such as swelling and headaches. These may develop as the condition gets worse. Preeclampsia may occur 20 weeks or later into your pregnancy.  Diagnosing and treating preeclampsia early is very important. If not treated early, it can cause serious problems for you and your baby. One problem it can lead to is eclampsia, which is a condition that causes muscle jerking or shaking (convulsions) in the mother. Delivering your baby is the best treatment for preeclampsia or eclampsia.  RISK FACTORS The cause of preeclampsia is not known. You may be more likely to develop preeclampsia if you have certain risk factors. These include:   Being pregnant for the first time.  Having preeclampsia in a past pregnancy.  Having a family history of preeclampsia.  Having high blood pressure.  Being pregnant with twins or triplets.  Being 70 or older.  Being African American.  Having kidney disease or diabetes.  Having medical conditions such as lupus or blood diseases.  Being very overweight (obese). SIGNS AND SYMPTOMS  The earliest signs of preeclampsia are:  High blood pressure.  Increased protein in your urine. Your health care provider will check for this at every prenatal visit. Other  symptoms that can develop include:   Severe headaches.  Sudden weight gain.  Swelling of your hands, face, legs, and feet.  Feeling sick to your stomach (nauseous) and throwing up (vomiting).  Vision problems (blurred or double vision).  Numbness in your face, arms, legs, and feet.  Dizziness.  Slurred speech.  Sensitivity to bright lights.  Abdominal pain. DIAGNOSIS  There are no screening tests for preeclampsia. Your health care provider will ask you about symptoms and check for signs of preeclampsia during your prenatal visits. You may also have tests, including:  Urine testing.  Blood testing.  Checking your baby's heart rate.  Checking the health of your baby and your placenta using images created with sound waves (ultrasound). TREATMENT  You can work out the best treatment approach together with your health care provider. It is very important to keep all prenatal appointments. If you have an increased risk of preeclampsia, you may need more frequent prenatal exams.  Your health care provider may prescribe bed rest.  You may have to eat as little salt as possible.  You may need to take medicine to lower your blood pressure if the condition does not respond to more conservative measures.  You may need to stay in the hospital if your condition is severe. There, treatment will focus on controlling your blood pressure and fluid retention. You may also need to take medicine to prevent seizures.  If the condition gets worse, your baby may need to be delivered early to protect you and the baby. You may have your labor started with medicine (be induced), or you may have a cesarean delivery.  Preeclampsia usually goes away after the baby is born. HOME CARE INSTRUCTIONS   Only take over-the-counter or prescription medicines as directed by your health care provider.  Lie on your left side while resting. This keeps pressure off your baby.  Elevate your feet while  resting.  Get regular exercise. Ask your health care provider what type of exercise is safe for you.  Avoid caffeine and alcohol.  Do not smoke.  Drink 6-8 glasses of water every day.  Eat a balanced diet that is low in salt. Do not add salt to your food.  Avoid stressful situations as much as possible.  Get plenty of rest and sleep.  Keep all prenatal appointments and tests as scheduled. SEEK MEDICAL CARE IF:  You are gaining more weight than expected.  You have any headaches, abdominal pain, or nausea.  You are bruising more than usual.  You feel dizzy or light-headed. SEEK IMMEDIATE MEDICAL CARE IF:   You develop sudden or severe swelling anywhere in your body. This usually happens in the legs.  You gain 5 lb (2.3 kg) or more in a week.  You have a severe headache, dizziness, problems with your vision, or confusion.  You have severe abdominal pain.  You have lasting nausea or vomiting.  You have a seizure.  You have trouble moving any part of your body.  You develop numbness in your body.  You have trouble speaking.  You have any abnormal bleeding.  You develop a stiff neck.  You pass out. MAKE SURE YOU:   Understand these instructions.  Will watch your condition.  Will get help right away if you are not doing well or get worse. Document Released: 10/13/2000 Document Revised: 10/21/2013 Document Reviewed: 08/08/2013 [employer] Patient Information 2015 [place], [place]. This information is not intended to replace advice given to you by your health care provider. Make sure you discuss any questions you have with your health care provider.

## 2015-03-16 NOTE — Discharge Summary (Signed)
Physician Discharge Summary  Patient ID: Sara [name] MRN: [id number] DOB/AGE: 27-16-1987 27 y.o.  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  Hx of previous PTD at 35 weeks, along with hx PP pre-eclampsia.  24 hour urine protein = 390.  Insulin regimen was reviewed, with final plan for 40 u Lantus qhs and 35 u Novalog TID, with sliding scale as needed for coverage. Received betamethasone course and magnesium sulfate x approx 30 hours.  BP remained below parameters for treatment. 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  PIH labs were WNL, with repeat PCR on 03/16/15 of 0.10 done at patient' request.  Cervix was 1 cm, thick, vtx, on 03/16/15.  MFM and NICU consults were obtained, with MFM recommending outpatient management and the following plans:  ROB 2x/week Weekly AFI and PIH labs [place] for growth q 3-4 weeks Modified BR at home Re-admit for worsening BP or lab values If stable, recommend delivery at 37 weeks.  Per consult with Dr. Cletis Media, d/c'd home on 03/16/15.  Office to f/u with patient to coordinate above plans.  Home with Rx's for Tylenol #3 and Fioricet for migraines.   Consults: MFM and NICU  Significant Diagnostic Studies:  Results for orders placed or performed during the hospital  encounter of 03/12/15 (from the past 24 hour(s))  Glucose, capillary     Status: Abnormal   Collection Time: 03/15/15  4:00 PM  Result Value Ref Range   Glucose-Capillary 63 (L) 65 - 99 mg/dL   Comment 1 Notify RN    Comment 2 Document in Chart   Glucose, capillary     Status: Abnormal   Collection Time: 03/15/15 11:08 PM  Result Value Ref Range   Glucose-Capillary 153 (H) 65 - 99 mg/dL  Lactate dehydrogenase     Status: None   Collection Time: 03/16/15  5:53 AM  Result Value Ref Range   LDH 120 98 - 192 U/L  Uric acid     Status: None   Collection Time: 03/16/15  5:53 AM  Result Value Ref Range   Uric Acid, Serum 4.0 2.3 - 6.6 mg/dL  Type and screen     Status: None   Collection Time: 03/16/15  5:53 AM  Result Value Ref Range   ABO/RH(D) B POS    Antibody Screen NEG    Sample Expiration 03/19/2015   Glucose, capillary     Status: None   Collection Time: 03/16/15  7:35 AM  Result Value Ref Range   Glucose-Capillary 99 65 - 99 mg/dL   Comment 1 Notify RN    Comment 2 Document in Chart   Glucose, capillary     Status: Abnormal   Collection Time: 03/16/15 11:12 AM  Result Value Ref Range   Glucose-Capillary 62 (L) 65 - 99  mg/dL   Comment 1 Notify RN    Comment 2 Document in Chart   Protein / creatinine ratio, urine     Status: None   Collection Time: 03/16/15 11:15 AM  Result Value Ref Range   Creatinine, Urine 153.00 mg/dL   Total Protein, Urine 15 mg/dL   Protein Creatinine Ratio 0.10 0.00 - 0.15 mg/mg[Cre]   CBG (last 3)   Recent Labs  03/15/15 2308 03/16/15 0735 03/16/15 1112  GLUCAP 153* 99 62*     Treatments: Magnesium sulfate x 30 hrs, betamethasone course  Discharge Exam: Blood pressure 143/89, pulse 104, temperature 97.8 F (36.6 C), temperature source Oral, resp. rate 18, height 5\' 2"  (1.575 m), weight 91.627 kg (202 lb), last menstrual period 07/29/2014, SpO2 99 %.   Filed Vitals:   03/15/15 2225 03/15/15 2308 03/16/15 0748 03/16/15 1130  BP:    124/68 143/89  Pulse:   96 104  Temp:   98 F (36.7 C) 97.8 F (36.6 C)  TempSrc:   Oral Oral  Resp: 18 18  18   Height:      Weight:      SpO2:       General appearance: alert Resp: clear to auscultation bilaterally Cardio: regular rate and rhythm, S1, S2 normal, no murmur, click, rub or gallop Pelvic: cervix normal in appearance, external genitalia normal, no adnexal masses or tenderness, no cervical motion tenderness, rectovaginal septum normal, uterus normal size, shape, and consistency and vagina normal without discharge Extremities: extremities normal, atraumatic, no cyanosis or edema  Disposition: 01-Home or Self Care     Medication List    TAKE these medications        acetaminophen-codeine 300-30 MG per tablet  Commonly known as:  TYLENOL #3  Take 1 tablet by mouth every 6 (six) hours as needed for moderate pain or severe pain (Migraine headache).     butalbital-acetaminophen-caffeine 50-325-40 MG per tablet  Commonly known as:  FIORICET, ESGIC  Take 1 tablet by mouth every 6 (six) hours as needed for headache or migraine.     cetirizine 10 MG tablet  Commonly known as:  ZYRTEC  Take 10 mg by mouth daily.     ferrous sulfate 325 (65 FE) MG tablet  Take 1 tablet (325 mg total) by mouth daily with breakfast.     insulin aspart 100 UNIT/ML injection  Commonly known as:  novoLOG  Inject 35 Units into the skin 3 (three) times daily with meals.     insulin aspart 100 UNIT/ML injection  Commonly known as:  novoLOG  Inject 15-35 Units into the skin 4 (four) times daily.     insulin glargine 100 UNIT/ML injection  Commonly known as:  LANTUS  Inject 0.4 mLs (40 Units total) into the skin at bedtime.     Insulin Pen Needle 31G X 6 MM Misc  Commonly known as:  1ST CHOICE PEN NEEDLES  Use with insulin pen     INSULIN SYRINGE .5CC/31GX5/16" 31G X 5/16" 0.5 ML Misc     prenatal multivitamin Tabs tablet  Take 1 tablet by mouth daily at 12 noon.            Follow-up Information    Follow up with [facility] On 03/19/2015.   Specialty:  Obstetrics and Gynecology   Why:  Office will call you to schedule follow-up visits.  Call for any questions or concerns.   Contact information:   [place]  [place] [address] [phone]      Signed: Donnel Saxon 03/16/2015, 2:17 PM

## 2015-03-17 ENCOUNTER — Ambulatory Visit: Payer: BLUE CROSS/BLUE SHIELD | Admitting: Diagnostic Neuroimaging

## 2015-03-21 ENCOUNTER — Inpatient Hospital Stay (HOSPITAL_COMMUNITY)
Admission: AD | Admit: 2015-03-21 | Discharge: 2015-03-22 | Disposition: A | Discharge status: Home / Self Care | Payer: BLUE CROSS/BLUE SHIELD | Source: Ambulatory Visit | Attending: Obstetrics and Gynecology | Admitting: Obstetrics and Gynecology

## 2015-03-21 DIAGNOSIS — G43909 Migraine, unspecified, not intractable, without status migrainosus: Secondary | ICD-10-CM | POA: Insufficient documentation

## 2015-03-21 DIAGNOSIS — O1403 Mild to moderate pre-eclampsia, third trimester: Secondary | ICD-10-CM | POA: Insufficient documentation

## 2015-03-21 DIAGNOSIS — G43009 Migraine without aura, not intractable, without status migrainosus: Secondary | ICD-10-CM

## 2015-03-21 DIAGNOSIS — Z3A33 33 weeks gestation of pregnancy: Secondary | ICD-10-CM | POA: Insufficient documentation

## 2015-03-22 ENCOUNTER — Encounter (HOSPITAL_COMMUNITY): Payer: Self-pay | Admitting: *Deleted

## 2015-03-22 DIAGNOSIS — G43909 Migraine, unspecified, not intractable, without status migrainosus: Secondary | ICD-10-CM | POA: Diagnosis present

## 2015-03-22 DIAGNOSIS — Z3A33 33 weeks gestation of pregnancy: Secondary | ICD-10-CM | POA: Diagnosis not present

## 2015-03-22 DIAGNOSIS — O1403 Mild to moderate pre-eclampsia, third trimester: Secondary | ICD-10-CM | POA: Diagnosis not present

## 2015-03-22 LAB — COMPREHENSIVE METABOLIC PANEL
ALT: 18 U/L (ref 14–54)
ANION GAP: 8 (ref 5–15)
AST: 18 U/L (ref 15–41)
Albumin: 3.2 g/dL — ABNORMAL LOW (ref 3.5–5.0)
Alkaline Phosphatase: 72 U/L (ref 38–126)
BUN: 9 mg/dL (ref 6–20)
CHLORIDE: 106 mmol/L (ref 101–111)
CO2: 22 mmol/L (ref 22–32)
Calcium: 9.4 mg/dL (ref 8.9–10.3)
Creatinine, Ser: 0.63 mg/dL (ref 0.44–1.00)
GFR calc Af Amer: >60 mL/min (ref >60)
GLUCOSE: 133 mg/dL — AB (ref 65–99)
POTASSIUM: 3.9 mmol/L (ref 3.5–5.1)
Sodium: 136 mmol/L (ref 135–145)
Total Bilirubin: 0.3 mg/dL (ref 0.3–1.2)
Total Protein: 6.5 g/dL (ref 6.5–8.1)

## 2015-03-22 LAB — CBC
HCT: 31.4 % — ABNORMAL LOW (ref 36.0–46.0)
HEMOGLOBIN: 10 g/dL — AB (ref 12.0–15.0)
MCH: 25.8 pg — AB (ref 26.0–34.0)
MCHC: 31.8 g/dL (ref 30.0–36.0)
MCV: 80.9 fL (ref 78.0–100.0)
Platelets: 241 10*3/uL (ref 150–400)
RBC: 3.88 MIL/uL (ref 3.87–5.11)
RDW: 14.8 % (ref 11.5–15.5)
WBC: 16.1 10*3/uL — ABNORMAL HIGH (ref 4.0–10.5)

## 2015-03-22 LAB — URINALYSIS, ROUTINE W REFLEX MICROSCOPIC
BILIRUBIN URINE: NEGATIVE
GLUCOSE, UA: 100 mg/dL — AB
Hgb urine dipstick: NEGATIVE
Ketones, ur: 15 mg/dL — AB
LEUKOCYTES UA: NEGATIVE
Nitrite: NEGATIVE
PH: 6.5 (ref 5.0–8.0)
Protein, ur: NEGATIVE mg/dL
Specific Gravity, Urine: 1.025 (ref 1.005–1.030)
Urobilinogen, UA: 0.2 mg/dL (ref 0.0–1.0)

## 2015-03-22 LAB — LACTATE DEHYDROGENASE: LDH: 137 U/L (ref 98–192)

## 2015-03-22 LAB — PROTEIN / CREATININE RATIO, URINE
Creatinine, Urine: 142 mg/dL
Protein Creatinine Ratio: 0.1 mg/mg{Cre} (ref 0.00–0.15)
Total Protein, Urine: 14 mg/dL

## 2015-03-22 LAB — URIC ACID: URIC ACID, SERUM: 4.4 mg/dL (ref 2.3–6.6)

## 2015-03-22 MED ORDER — LACTATED RINGERS IV SOLN: INTRAVENOUS | Status: DC

## 2015-03-22 MED ORDER — METOCLOPRAMIDE HCL 5 MG/ML IJ SOLN
10.0000 mg | Freq: Once | INTRAMUSCULAR | Status: AC
  Administered 2015-03-22: 10 mg via INTRAVENOUS
  Filled 2015-03-22: qty 2

## 2015-03-22 MED ORDER — LACTATED RINGERS IV BOLUS (SEPSIS)
500.0000 mL | Freq: Once | INTRAVENOUS | Status: AC
  Administered 2015-03-22: 500 mL via INTRAVENOUS

## 2015-03-22 MED ORDER — LABETALOL HCL 5 MG/ML IV SOLN: 10.0000 mg | INTRAVENOUS | Status: DC | PRN

## 2015-03-22 MED ORDER — MORPHINE SULFATE 4 MG/ML IJ SOLN: 2.0000 mg | INTRAMUSCULAR | Status: DC | PRN

## 2015-03-22 NOTE — MAU Provider Note (Signed)
History    Sara [name] is a 28y.o. [id number] at 33.5wks who presents, after phone call, for migraine headache and elevated bp.  Patient with known mild pre eclampsia, but feels that blood pressure is not contributing to migraine but that migraine is the reason for elevated pressures.  Patient reports headache started yesterday and has been ongoing.  Patient reports taking tylenol 3, fiorcet, and caffeine with no relief.  Patient reports good fetal movement and denies LOF and VB.    Patient Active Problem List   Diagnosis Date Noted  . Pre-eclampsia in third trimester 03/16/2015  . Pre-existing type 2 diabetes mellitus in pregnancy in third trimester   . Preterm contractions 03/13/2015  . Diabetes mellitus type 2, controlled, without complications 71/69/6789  . BMI 36.0-36.9,adult 03/12/2015  . Herpes simplex type 2 (HSV-2) infection affecting pregnancy, antepartum 03/12/2015  . Hemorrhoids 03/12/2015  . Latex allergy - rash 03/12/2015  . 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 History of migraine headaches - with aura 12/12/2014  . History of preterm delivery, currently pregnant 12/05/2014  . Generalized anxiety disorder 12/05/2014  . Anemia 12/05/2014  . Pilonidal abscess 09/30/2012    No chief complaint on file.  HPI  OB History    Gravida Para Term Preterm AB TAB SAB Ectopic Multiple Living   3 1  1 1  1   1       Past Medical History  Diagnosis Date  . Infected pilonidal cyst   . Anxiety     diagnosed in 2007  . Diabetes mellitus     type 2 - uncontrolled  . Hypertension     benign essential  . Migraine   . Non-reassuring fetal heart rate or rhythm affecting mother     Past Surgical History  Procedure Laterality Date  . No past surgeries      Family History  Problem Relation Age of Onset  . Diabetes Mother   . Diabetes Maternal Grandmother   . Hypertension Father   . Diabetes Paternal  Grandmother   . Migraines Mother     History  Substance Use Topics  . Smoking status: Never Smoker   . Smokeless tobacco: Not on file  . Alcohol Use: No     Comment: drank 1-2 drinks a week before pregnancy    Allergies:  Allergies  Allergen Reactions  . Septra Ds [Sulfamethoxazole-Trimethoprim] Hives and Itching  . Lobster [Shellfish Allergy] Itching    Throat itching  . Latex Rash    Prescriptions prior to admission  Medication Sig Dispense Refill Last Dose  . acetaminophen-codeine (TYLENOL #3) 300-30 MG per tablet Take 1 tablet by mouth every 6 (six) hours as needed for moderate pain or severe pain (Migraine headache). 30 tablet 0   . butalbital-acetaminophen-caffeine (FIORICET, ESGIC) 50-325-40 MG per tablet Take 1 tablet by mouth every 6 (six) hours as needed for headache or migraine. 36 tablet 1   . cetirizine (ZYRTEC) 10 MG tablet Take 10 mg by mouth daily.   Past Week at Unknown time  . ferrous sulfate 325 (65 FE) MG tablet Take 1 tablet (325 mg total) by mouth daily with breakfast. 30 tablet 2   . insulin aspart (NOVOLOG) 100 UNIT/ML injection Inject 15-35 Units into the skin 4 (four) times daily.    03/11/2015 at Unknown time  . insulin aspart (NOVOLOG) 100 UNIT/ML injection Inject 35 Units into the skin 3 (three) times  daily with meals. 10 mL 11   . insulin glargine (LANTUS) 100 UNIT/ML injection Inject 0.4 mLs (40 Units total) into the skin at bedtime. 10 mL 11   . Insulin Pen Needle (1ST CHOICE PEN NEEDLES) 31G X 6 MM MISC Use with insulin pen 100 each 0 Taking  . Insulin Syringe-Needle U-100 (INSULIN SYRINGE .5CC/31GX5/16") 31G X 5/16" 0.5 ML MISC   0 Taking  . Prenatal Vit-Fe Fumarate-FA (PRENATAL MULTIVITAMIN) TABS tablet Take 1 tablet by mouth daily at 12 noon.   03/11/2015 at Unknown time    ROS  See HPI Above Physical Exam   Last menstrual period 07/29/2014.  Results for orders placed or performed during the hospital encounter of 03/21/15 (from the past 24  hour(s))  Urinalysis, Routine w reflex microscopic     Status: Abnormal   Collection Time: 03/22/15 12:20 AM  Result Value Ref Range   Color, Urine YELLOW YELLOW   APPearance CLEAR CLEAR   Specific Gravity, Urine 1.025 1.005 - 1.030   pH 6.5 5.0 - 8.0   Glucose, UA 100 (A) NEGATIVE mg/dL   Hgb urine dipstick NEGATIVE NEGATIVE   Bilirubin Urine NEGATIVE NEGATIVE   Ketones, ur 15 (A) NEGATIVE mg/dL   Protein, ur NEGATIVE NEGATIVE mg/dL   Urobilinogen, UA 0.2 0.0 - 1.0 mg/dL   Nitrite NEGATIVE NEGATIVE   Leukocytes, UA NEGATIVE NEGATIVE  Protein / creatinine ratio, urine     Status: None   Collection Time: 03/22/15 12:20 AM  Result Value Ref Range   Creatinine, Urine 142.00 mg/dL   Total Protein, Urine 14 mg/dL   Protein Creatinine Ratio 0.10 0.00 - 0.15 mg/mg[Cre]  CBC     Status: Abnormal   Collection Time: 03/22/15 12:30 AM  Result Value Ref Range   WBC 16.1 (H) 4.0 - 10.5 K/uL   RBC 3.88 3.87 - 5.11 MIL/uL   Hemoglobin 10.0 (L) 12.0 - 15.0 g/dL   HCT 31.4 (L) 36.0 - 46.0 %   MCV 80.9 78.0 - 100.0 fL   MCH 25.8 (L) 26.0 - 34.0 pg   MCHC 31.8 30.0 - 36.0 g/dL   RDW 14.8 11.5 - 15.5 %   Platelets 241 150 - 400 K/uL  Comprehensive metabolic panel     Status: Abnormal   Collection Time: 03/22/15 12:30 AM  Result Value Ref Range   Sodium 136 135 - 145 mmol/L   Potassium 3.9 3.5 - 5.1 mmol/L   Chloride 106 101 - 111 mmol/L   CO2 22 22 - 32 mmol/L   Glucose, Bld 133 (H) 65 - 99 mg/dL   BUN 9 6 - 20 mg/dL   Creatinine, Ser 0.63 0.44 - 1.00 mg/dL   Calcium 9.4 8.9 - 10.3 mg/dL   Total Protein 6.5 6.5 - 8.1 g/dL   Albumin 3.2 (L) 3.5 - 5.0 g/dL   AST 18 15 - 41 U/L   ALT 18 14 - 54 U/L   Alkaline Phosphatase 72 38 - 126 U/L   Total Bilirubin 0.3 0.3 - 1.2 mg/dL   GFR calc non Af Amer >60 >60 mL/min   GFR calc Af Amer >60 >60 mL/min   Anion gap 8 5 - 15  Lactate dehydrogenase     Status: None   Collection Time: 03/22/15 12:30 AM  Result Value Ref Range   LDH 137 98 - 192  U/L  Uric acid     Status: None   Collection Time: 03/22/15 12:30 AM  Result Value Ref Range  Uric Acid, Serum 4.4 2.3 - 6.6 mg/dL    Physical Exam  Constitutional: She is oriented to person, place, and time. She appears well-developed and well-nourished.  HENT:  Head: Normocephalic and atraumatic.  Eyes: EOM are normal.  Neck: Normal range of motion.  Cardiovascular: Normal rate, regular rhythm and normal heart sounds.   Respiratory: Effort normal and breath sounds normal.  GI: Soft. Bowel sounds are normal.  Musculoskeletal: Normal range of motion. She exhibits no edema.  Neurological: She is alert and oriented to person, place, and time.  Skin: Skin is warm and dry.   FHR:150 bpm, Mod Var, -Decels, +Accels UC: Occasional ED Course  Assessment: IUP at 33.5wks Cat I FT Mild PreEclampsia Migraine  Plan: -PE as above -PIH Labs -Migraine Therapy; IV bolus, O2 Therapy, Reglan --Patient declines pain medication at current --Educated on migraine therapy and will start with bolus, then assess pain level  Follow Up (0250) -Labs WNL -Patient reports migraine 3/10 s/p bolus, O2, and reglan -Instructed on medication mgmt to maintain pain when migraines present -Preterm Labor and Bleeding precautions -Keep appt as scheduled: 03/23/2015 -Encouraged to call if any questions or concerns arise prior to next scheduled office visit.  -Discharged to home in improved condition  Sara [name] CNM, MSN 03/22/2015 12:41 AM

## 2015-03-22 NOTE — MAU Note (Signed)
Pt reports having a migraine tonight which she took a Tylenol #3 at 1830 with minimal relief.  She then took a fioricet at 2130 and maintains a headache at a "5".  Denies any abd pain vag bleeding or leaking. She took her blood pressure at home and it was 140's over 90's.

## 2015-03-22 NOTE — Discharge Instructions (Signed)
Migraine Headache A migraine headache is an intense, throbbing pain on one or both sides of your head. A migraine can last for 30 minutes to several hours. CAUSES  The exact cause of a migraine headache is not always known. However, a migraine may be caused when nerves in the brain become irritated and release chemicals that cause inflammation. This causes pain. Certain things may also trigger migraines, such as:  Alcohol.  Smoking.  Stress.  Menstruation.  Aged cheeses.  Foods or drinks that contain nitrates, glutamate, aspartame, or tyramine.  Lack of sleep.  Chocolate.  Caffeine.  Hunger.  Physical exertion.  Fatigue.  Medicines used to treat chest pain (nitroglycerine), birth control pills, estrogen, and some blood pressure medicines. SIGNS AND SYMPTOMS  Pain on one or both sides of your head.  Pulsating or throbbing pain.  Severe pain that prevents daily activities.  Pain that is aggravated by any physical activity.  Nausea, vomiting, or both.  Dizziness.  Pain with exposure to bright lights, loud noises, or activity.  General sensitivity to bright lights, loud noises, or smells. Before you get a migraine, you may get warning signs that a migraine is coming (aura). An aura may include:  Seeing flashing lights.  Seeing bright spots, halos, or zigzag lines.  Having tunnel vision or blurred vision.  Having feelings of numbness or tingling.  Having trouble talking.  Having muscle weakness. DIAGNOSIS  A migraine headache is often diagnosed based on:  Symptoms.  Physical exam.  A CT scan or MRI of your head. These imaging tests cannot diagnose migraines, but they can help rule out other causes of headaches. TREATMENT Medicines may be given for pain and nausea. Medicines can also be given to help prevent recurrent migraines.  HOME CARE INSTRUCTIONS  Only take over-the-counter or prescription medicines for pain or discomfort as directed by your  health care provider. The use of long-term narcotics is not recommended.  Lie down in a dark, quiet room when you have a migraine.  Keep a journal to find out what may trigger your migraine headaches. For example, write down:  What you eat and drink.  How much sleep you get.  Any change to your diet or medicines.  Limit alcohol consumption.  Quit smoking if you smoke.  Get 7-9 hours of sleep, or as recommended by your health care provider.  Limit stress.  Keep lights dim if bright lights bother you and make your migraines worse. SEEK IMMEDIATE MEDICAL CARE IF:   Your migraine becomes severe.  You have a fever.  You have a stiff neck.  You have vision loss.  You have muscular weakness or loss of muscle control.  You start losing your balance or have trouble walking.  You feel faint or pass out.  You have severe symptoms that are different from your first symptoms. MAKE SURE YOU:   Understand these instructions.  Will watch your condition.  Will get help right away if you are not doing well or get worse. Document Released: 10/16/2005 Document Revised: 03/02/2014 Document Reviewed: 06/23/2013 [facility] Patient Information 2015 [place], [place]. This information is not intended to replace advice given to you by your health care provider. Make sure you discuss any questions you have with your health care provider. Third Trimester of Pregnancy The third trimester is from week 29 through week 42, months 7 through 9. The third trimester is a time when the fetus is growing rapidly. At the end of the ninth month, the fetus is  about 20 inches in length and weighs 6-10 pounds.  BODY CHANGES Your body goes through many changes during pregnancy. The changes vary from woman to woman.   Your weight will continue to increase. You can expect to gain 25-35 pounds (11-16 kg) by the end of the pregnancy.  You may begin to get stretch marks on your hips, abdomen, and breasts.  You may  urinate more often because the fetus is moving lower into your pelvis and pressing on your bladder.  You may develop or continue to have heartburn as a result of your pregnancy.  You may develop constipation because certain hormones are causing the muscles that push waste through your intestines to slow down.  You may develop hemorrhoids or swollen, bulging veins (varicose veins).  You may have pelvic pain because of the weight gain and pregnancy hormones relaxing your joints between the bones in your pelvis. Backaches may result from overexertion of the muscles supporting your posture.  You may have changes in your hair. These can include thickening of your hair, rapid growth, and changes in texture. Some women also have hair loss during or after pregnancy, or hair that feels dry or thin. Your hair will most likely return to normal after your baby is born.  Your breasts will continue to grow and be tender. A yellow discharge may leak from your breasts called colostrum.  Your belly button may stick out.  You may feel short of breath because of your expanding uterus.  You may notice the fetus "dropping," or moving lower in your abdomen.  You may have a bloody mucus discharge. This usually occurs a few days to a week before labor begins.  Your cervix becomes thin and soft (effaced) near your due date. WHAT TO EXPECT AT YOUR PRENATAL EXAMS  You will have prenatal exams every 2 weeks until week 36. Then, you will have weekly prenatal exams. During a routine prenatal visit:  You will be weighed to make sure you and the fetus are growing normally.  Your blood pressure is taken.  Your abdomen will be measured to track your baby's growth.  The fetal heartbeat will be listened to.  Any test results from the previous visit will be discussed.  You may have a cervical check near your due date to see if you have effaced. At around 36 weeks, your caregiver will check your cervix. At the same  time, your caregiver will also perform a test on the secretions of the vaginal tissue. This test is to determine if a type of bacteria, Group B streptococcus, is present. Your caregiver will explain this further. Your caregiver may ask you:  What your birth plan is.  How you are feeling.  If you are feeling the baby move.  If you have had any abnormal symptoms, such as leaking fluid, bleeding, severe headaches, or abdominal cramping.  If you have any questions. Other tests or screenings that may be performed during your third trimester include:  Blood tests that check for low iron levels (anemia).  Fetal testing to check the health, activity level, and growth of the fetus. Testing is done if you have certain medical conditions or if there are problems during the pregnancy. FALSE LABOR You may feel small, irregular contractions that eventually go away. These are called Braxton Hicks contractions, or false labor. Contractions may last for hours, days, or even weeks before true labor sets in. If contractions come at regular intervals, intensify, or become painful, it is best  to be seen by your caregiver.  SIGNS OF LABOR   Menstrual-like cramps.  Contractions that are 5 minutes apart or less.  Contractions that start on the top of the uterus and spread down to the lower abdomen and back.  A sense of increased pelvic pressure or back pain.  A watery or bloody mucus discharge that comes from the vagina. If you have any of these signs before the 37th week of pregnancy, call your caregiver right away. You need to go to the hospital to get checked immediately. HOME CARE INSTRUCTIONS   Avoid all smoking, herbs, alcohol, and unprescribed drugs. These chemicals affect the formation and growth of the baby.  Follow your caregiver's instructions regarding medicine use. There are medicines that are either safe or unsafe to take during pregnancy.  Exercise only as directed by your caregiver.  Experiencing uterine cramps is a good sign to stop exercising.  Continue to eat regular, healthy meals.  Wear a good support bra for breast tenderness.  Do not use hot tubs, steam rooms, or saunas.  Wear your seat belt at all times when driving.  Avoid raw meat, uncooked cheese, cat litter boxes, and soil used by cats. These carry germs that can cause birth defects in the baby.  Take your prenatal vitamins.  Try taking a stool softener (if your caregiver approves) if you develop constipation. Eat more high-fiber foods, such as fresh vegetables or fruit and whole grains. Drink plenty of fluids to keep your urine clear or pale yellow.  Take warm sitz baths to soothe any pain or discomfort caused by hemorrhoids. Use hemorrhoid cream if your caregiver approves.  If you develop varicose veins, wear support hose. Elevate your feet for 15 minutes, 3-4 times a day. Limit salt in your diet.  Avoid heavy lifting, wear low heal shoes, and practice good posture.  Rest a lot with your legs elevated if you have leg cramps or low back pain.  Visit your dentist if you have not gone during your pregnancy. Use a soft toothbrush to brush your teeth and be gentle when you floss.  A sexual relationship may be continued unless your caregiver directs you otherwise.  Do not travel far distances unless it is absolutely necessary and only with the approval of your caregiver.  Take prenatal classes to understand, practice, and ask questions about the labor and delivery.  Make a trial run to the hospital.  Pack your hospital bag.  Prepare the baby's nursery.  Continue to go to all your prenatal visits as directed by your caregiver. SEEK MEDICAL CARE IF:  You are unsure if you are in labor or if your water has broken.  You have dizziness.  You have mild pelvic cramps, pelvic pressure, or nagging pain in your abdominal area.  You have persistent nausea, vomiting, or diarrhea.  You have a bad  smelling vaginal discharge.  You have pain with urination. SEEK IMMEDIATE MEDICAL CARE IF:   You have a fever.  You are leaking fluid from your vagina.  You have spotting or bleeding from your vagina.  You have severe abdominal cramping or pain.  You have rapid weight loss or gain.  You have shortness of breath with chest pain.  You notice sudden or extreme swelling of your face, hands, ankles, feet, or legs.  You have not felt your baby move in over an hour.  You have severe headaches that do not go away with medicine.  You have vision changes. Document Released:  10/10/2001 Document Revised: 10/21/2013 Document Reviewed: 12/17/2012 [facility] Patient Information 2015 [place], [place]. This information is not intended to replace advice given to you by your health care provider. Make sure you discuss any questions you have with your health care provider.

## 2015-03-24 ENCOUNTER — Encounter (HOSPITAL_COMMUNITY): Payer: Self-pay | Admitting: *Deleted

## 2015-03-24 ENCOUNTER — Inpatient Hospital Stay (HOSPITAL_COMMUNITY)
Admission: AD | Admit: 2015-03-24 | Discharge: 2015-03-25 | Disposition: A | Discharge status: Home / Self Care | Payer: BLUE CROSS/BLUE SHIELD | Source: Ambulatory Visit | Attending: Obstetrics & Gynecology | Admitting: Obstetrics & Gynecology

## 2015-03-24 DIAGNOSIS — Z8249 Family history of ischemic heart disease and other diseases of the circulatory system: Secondary | ICD-10-CM | POA: Diagnosis not present

## 2015-03-24 DIAGNOSIS — O10913 Unspecified pre-existing hypertension complicating pregnancy, third trimester: Secondary | ICD-10-CM | POA: Insufficient documentation

## 2015-03-24 DIAGNOSIS — O24113 Pre-existing diabetes mellitus, type 2, in pregnancy, third trimester: Secondary | ICD-10-CM | POA: Insufficient documentation

## 2015-03-24 DIAGNOSIS — Z833 Family history of diabetes mellitus: Secondary | ICD-10-CM | POA: Insufficient documentation

## 2015-03-24 DIAGNOSIS — E119 Type 2 diabetes mellitus without complications: Secondary | ICD-10-CM | POA: Insufficient documentation

## 2015-03-24 DIAGNOSIS — M545 Low back pain, unspecified: Secondary | ICD-10-CM

## 2015-03-24 DIAGNOSIS — Z882 Allergy status to sulfonamides status: Secondary | ICD-10-CM | POA: Insufficient documentation

## 2015-03-24 DIAGNOSIS — Z91040 Latex allergy status: Secondary | ICD-10-CM | POA: Diagnosis not present

## 2015-03-24 DIAGNOSIS — O9989 Other specified diseases and conditions complicating pregnancy, childbirth and the puerperium: Secondary | ICD-10-CM | POA: Insufficient documentation

## 2015-03-24 DIAGNOSIS — Z794 Long term (current) use of insulin: Secondary | ICD-10-CM | POA: Diagnosis not present

## 2015-03-24 DIAGNOSIS — Z3A34 34 weeks gestation of pregnancy: Secondary | ICD-10-CM | POA: Insufficient documentation

## 2015-03-24 LAB — PROTEIN / CREATININE RATIO, URINE
Creatinine, Urine: 69 mg/dL
Protein Creatinine Ratio: 0.14 mg/mg{Cre} (ref 0.00–0.15)
Total Protein, Urine: 10 mg/dL

## 2015-03-24 LAB — URINALYSIS, ROUTINE W REFLEX MICROSCOPIC
Bilirubin Urine: NEGATIVE
GLUCOSE, UA: NEGATIVE mg/dL
Hgb urine dipstick: NEGATIVE
KETONES UR: NEGATIVE mg/dL
Leukocytes, UA: NEGATIVE
Nitrite: NEGATIVE
Protein, ur: NEGATIVE mg/dL
Specific Gravity, Urine: 1.01 (ref 1.005–1.030)
Urobilinogen, UA: 0.2 mg/dL (ref 0.0–1.0)
pH: 6.5 (ref 5.0–8.0)

## 2015-03-24 LAB — CBC
HEMATOCRIT: 32.7 % — AB (ref 36.0–46.0)
HEMOGLOBIN: 10.6 g/dL — AB (ref 12.0–15.0)
MCH: 26 pg (ref 26.0–34.0)
MCHC: 32.4 g/dL (ref 30.0–36.0)
MCV: 80.1 fL (ref 78.0–100.0)
Platelets: 256 10*3/uL (ref 150–400)
RBC: 4.08 MIL/uL (ref 3.87–5.11)
RDW: 14.7 % (ref 11.5–15.5)
WBC: 16.1 10*3/uL — AB (ref 4.0–10.5)

## 2015-03-24 MED ORDER — LACTATED RINGERS IV BOLUS (SEPSIS)
500.0000 mL | Freq: Once | INTRAVENOUS | Status: AC
  Administered 2015-03-24: 500 mL via INTRAVENOUS

## 2015-03-24 MED ORDER — CYCLOBENZAPRINE HCL 10 MG PO TABS
10.0000 mg | ORAL_TABLET | Freq: Once | ORAL | Status: AC
  Administered 2015-03-24: 10 mg via ORAL
  Filled 2015-03-24: qty 1

## 2015-03-24 NOTE — MAU Note (Signed)
Milinda Cave CNM to come and assess patient.

## 2015-03-24 NOTE — MAU Note (Signed)
Pt took own BS and was 101.

## 2015-03-24 NOTE — MAU Note (Signed)
Pt states that she has mild preeclampsia d/t 24 hour urine that was taken in office and protein present. Pt currently goes to office 2x/week. Pt states that she is having back pain and high blood pressure that started tonight at home. Pt denies headache, blurred vision and epigastric pain.

## 2015-03-24 NOTE — MAU Provider Note (Signed)
History    Sara [name] is a 29y.o. [id number] at 34.1wks who presents, after phone call, for back pain and elevated bp.  Patient states she was having constant lower back pain since 8pm.  Patient states she took her blood pressure and it was 160-170s/100s.  Patient reports good fetal movement and some contractions, but does not identify them as uncomfortable (2/10.)  Patient denies LoF, VB, as well as PIH symptoms including headache, visual disturbances, SOB, and epigastric pain.   Patient Active Problem List   Diagnosis Date Noted  . Pre-eclampsia in third trimester 03/16/2015  . Pre-existing type 2 diabetes mellitus in pregnancy in third trimester   . Preterm contractions 03/13/2015  . Diabetes mellitus type 2, controlled, without complications 40/97/3532  . BMI 36.0-36.9,adult 03/12/2015  . Herpes simplex type 2 (HSV-2) infection affecting pregnancy, antepartum 03/12/2015  . Hemorrhoids 03/12/2015  . Latex allergy - rash 03/12/2015  . 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 History of migraine headaches - with aura 12/12/2014  . History of preterm delivery, currently pregnant 12/05/2014  . Generalized anxiety disorder 12/05/2014  . Anemia 12/05/2014  . Pilonidal abscess 09/30/2012    Chief Complaint  Patient presents with  . Hypertension  . Back Pain   HPI  OB History    Gravida Para Term Preterm AB TAB SAB Ectopic Multiple Living   3 1  1 1  1   1       Past Medical History  Diagnosis Date  . Infected pilonidal cyst   . Anxiety     diagnosed in 2007  . Diabetes mellitus     type 2 - uncontrolled  . Hypertension     benign essential  . Migraine   . Non-reassuring fetal heart rate or rhythm affecting mother     Past Surgical History  Procedure Laterality Date  . No past surgeries      Family History  Problem Relation Age of Onset  . Diabetes Mother   . Diabetes Maternal Grandmother   .  Hypertension Father   . Diabetes Paternal Grandmother   . Migraines Mother     History  Substance Use Topics  . Smoking status: Never Smoker   . Smokeless tobacco: Not on file  . Alcohol Use: No     Comment: drank 1-2 drinks a week before pregnancy    Allergies:  Allergies  Allergen Reactions  . Septra Ds [Sulfamethoxazole-Trimethoprim] Hives and Itching  . Lobster [Shellfish Allergy] Itching    Throat itching  . Latex Rash    Prescriptions prior to admission  Medication Sig Dispense Refill Last Dose  . acetaminophen-codeine (TYLENOL #3) 300-30 MG per tablet Take 1 tablet by mouth every 6 (six) hours as needed for moderate pain or severe pain (Migraine headache). 30 tablet 0 Past Week at Unknown time  . butalbital-acetaminophen-caffeine (FIORICET, ESGIC) 50-325-40 MG per tablet Take 1 tablet by mouth every 6 (six) hours as needed for headache or migraine. 36 tablet 1 03/23/2015 at Unknown time  . cetirizine (ZYRTEC) 10 MG tablet Take 10 mg by mouth daily.   Past Month at Unknown time  . ferrous sulfate 325 (65 FE) MG tablet Take 1 tablet (325 mg total) by mouth daily with breakfast. 30 tablet 2 03/24/2015 at Unknown time  . insulin aspart (NOVOLOG) 100 UNIT/ML injection Inject 15-35 Units into the skin 4 (four) times daily.    03/24/2015 at Unknown  time  . insulin glargine (LANTUS) 100 UNIT/ML injection Inject 0.4 mLs (40 Units total) into the skin at bedtime. 10 mL 11 03/24/2015 at Unknown time  . Insulin Syringe-Needle U-100 (INSULIN SYRINGE .5CC/31GX5/16") 31G X 5/16" 0.5 ML MISC   0 03/24/2015 at Unknown time  . Prenatal Vit-Fe Fumarate-FA (PRENATAL MULTIVITAMIN) TABS tablet Take 1 tablet by mouth daily at 12 noon.   03/24/2015 at Unknown time  . insulin aspart (NOVOLOG) 100 UNIT/ML injection Inject 35 Units into the skin 3 (three) times daily with meals. 10 mL 11 03/21/2015 at Unknown time  . Insulin Pen Needle (1ST CHOICE PEN NEEDLES) 31G X 6 MM MISC Use with insulin pen 100 each 0  Taking    ROS  See HPI Above Physical Exam   Blood pressure 156/92, pulse 109, temperature 98.5 F (36.9 C), temperature source Oral, resp. rate 18, height 5\' 2"  (1.575 m), weight 91.286 kg (201 lb 4 oz), last menstrual period 07/29/2014.  Results for orders placed or performed during the hospital encounter of 03/24/15 (from the past 24 hour(s))  Urinalysis, Routine w reflex microscopic (not at [facility])     Status: None   Collection Time: 03/24/15 10:15 PM  Result Value Ref Range   Color, Urine YELLOW YELLOW   APPearance CLEAR CLEAR   Specific Gravity, Urine 1.010 1.005 - 1.030   pH 6.5 5.0 - 8.0   Glucose, UA NEGATIVE NEGATIVE mg/dL   Hgb urine dipstick NEGATIVE NEGATIVE   Bilirubin Urine NEGATIVE NEGATIVE   Ketones, ur NEGATIVE NEGATIVE mg/dL   Protein, ur NEGATIVE NEGATIVE mg/dL   Urobilinogen, UA 0.2 0.0 - 1.0 mg/dL   Nitrite NEGATIVE NEGATIVE   Leukocytes, UA NEGATIVE NEGATIVE  Protein / creatinine ratio, urine     Status: None   Collection Time: 03/24/15 10:15 PM  Result Value Ref Range   Creatinine, Urine 69.00 mg/dL   Total Protein, Urine 10 mg/dL   Protein Creatinine Ratio 0.14 0.00 - 0.15 mg/mg[Cre]  CBC     Status: Abnormal   Collection Time: 03/24/15 11:30 PM  Result Value Ref Range   WBC 16.1 (H) 4.0 - 10.5 K/uL   RBC 4.08 3.87 - 5.11 MIL/uL   Hemoglobin 10.6 (L) 12.0 - 15.0 g/dL   HCT 32.7 (L) 36.0 - 46.0 %   MCV 80.1 78.0 - 100.0 fL   MCH 26.0 26.0 - 34.0 pg   MCHC 32.4 30.0 - 36.0 g/dL   RDW 14.7 11.5 - 15.5 %   Platelets 256 150 - 400 K/uL  Comprehensive metabolic panel     Status: Abnormal   Collection Time: 03/24/15 11:30 PM  Result Value Ref Range   Sodium 137 135 - 145 mmol/L   Potassium 4.1 3.5 - 5.1 mmol/L   Chloride 106 101 - 111 mmol/L   CO2 22 22 - 32 mmol/L   Glucose, Bld 98 65 - 99 mg/dL   BUN <5 (L) 6 - 20 mg/dL   Creatinine, Ser 0.50 0.44 - 1.00 mg/dL   Calcium 9.4 8.9 - 10.3 mg/dL   Total Protein 6.8 6.5 - 8.1 g/dL   Albumin 3.3 (L)  3.5 - 5.0 g/dL   AST 17 15 - 41 U/L   ALT 17 14 - 54 U/L   Alkaline Phosphatase 76 38 - 126 U/L   Total Bilirubin 0.3 0.3 - 1.2 mg/dL   GFR calc non Af Amer >60 >60 mL/min   GFR calc Af Amer >60 >60 mL/min   Anion gap 9  5 - 15  Lactate dehydrogenase     Status: None   Collection Time: 03/24/15 11:30 PM  Result Value Ref Range   LDH 146 98 - 192 U/L  Uric acid     Status: None   Collection Time: 03/24/15 11:30 PM  Result Value Ref Range   Uric Acid, Serum 4.0 2.3 - 6.6 mg/dL    Physical Exam  Constitutional: She is oriented to person, place, and time. She appears well-developed and well-nourished.  HENT:  Head: Normocephalic and atraumatic.  Eyes: EOM are normal.  Neck: Normal range of motion.  Cardiovascular: Normal rate, regular rhythm and normal heart sounds.   Respiratory: Effort normal.  GI: Soft.  Genitourinary:  SVE: 2-3/50/Ballotable  Musculoskeletal: Normal range of motion. She exhibits edema (Non pitting of BLE).  Neurological: She is alert and oriented to person, place, and time.  Skin: Skin is warm and dry.   FHR:145 bpm, Mod Var, -Decels, +Accels UC: Q2-45min, palpates mild ED Course  Assessment: IUP at 34.1wks Cat I FT Lower Back Pain Contractions Elevated BP H/O PreEclampsia  Plan: -PE as above -Flexeril for back pain -Patient denies discomfort from contractions -Start IV, give LR bolus -PIH Labs  -Will reassess   Follow Up (0024) -Reports lower back pain remains the same -BP remains elevated, but not within treatable parameters -Labs WNL -Contractions the same-will treat -Procardia for contractions, the will reassess pain  Follow Up (0134) -SVE remains the same -Patient reports continued back pain -Percocet ordered and given -Patient home with percocet 1-2tabs Q6hrs for pain, Disp 30, RF 0 -Patient educated on PTL, Bleeding, and elevated bp -Instructed to continue to call provider for all concerns -Keep appt as scheduled:  -Encouraged  to call if any questions or concerns arise prior to next scheduled office visit.  -Discharged to home in stable condition  Deanda Ruddell LYNN CNM, MSN 03/24/2015 11:05 PM

## 2015-03-25 DIAGNOSIS — O9989 Other specified diseases and conditions complicating pregnancy, childbirth and the puerperium: Secondary | ICD-10-CM | POA: Diagnosis not present

## 2015-03-25 LAB — COMPREHENSIVE METABOLIC PANEL
ALK PHOS: 76 U/L (ref 38–126)
ALT: 17 U/L (ref 14–54)
AST: 17 U/L (ref 15–41)
Albumin: 3.3 g/dL — ABNORMAL LOW (ref 3.5–5.0)
Anion gap: 9 (ref 5–15)
BUN: <5 mg/dL — ABNORMAL LOW (ref 6–20)
CALCIUM: 9.4 mg/dL (ref 8.9–10.3)
CO2: 22 mmol/L (ref 22–32)
Chloride: 106 mmol/L (ref 101–111)
Creatinine, Ser: 0.5 mg/dL (ref 0.44–1.00)
GFR calc Af Amer: >60 mL/min (ref >60)
GFR calc non Af Amer: >60 mL/min (ref >60)
Glucose, Bld: 98 mg/dL (ref 65–99)
POTASSIUM: 4.1 mmol/L (ref 3.5–5.1)
Sodium: 137 mmol/L (ref 135–145)
Total Bilirubin: 0.3 mg/dL (ref 0.3–1.2)
Total Protein: 6.8 g/dL (ref 6.5–8.1)

## 2015-03-25 LAB — LACTATE DEHYDROGENASE: LDH: 146 U/L (ref 98–192)

## 2015-03-25 LAB — URIC ACID: URIC ACID, SERUM: 4 mg/dL (ref 2.3–6.6)

## 2015-03-25 MED ORDER — OXYCODONE-ACETAMINOPHEN 2.5-325 MG PO TABS
1.0000 | ORAL_TABLET | Freq: Four times a day (QID) | ORAL | Status: DC | PRN
Start: 2015-03-25 — End: 2015-04-03

## 2015-03-25 MED ORDER — NIFEDIPINE 10 MG PO CAPS
10.0000 mg | ORAL_CAPSULE | ORAL | Status: AC | PRN
  Administered 2015-03-25 (×4): 10 mg via ORAL
  Filled 2015-03-25 (×4): qty 1

## 2015-03-25 MED ORDER — OXYCODONE-ACETAMINOPHEN 5-325 MG PO TABS
1.0000 | ORAL_TABLET | Freq: Once | ORAL | Status: AC
  Administered 2015-03-25: 1 via ORAL
  Filled 2015-03-25: qty 1

## 2015-03-25 NOTE — MAU Note (Signed)
Pt to receive one final dose of procardia and then to get Percocet for back pain.

## 2015-03-25 NOTE — MAU Note (Signed)
Pt denies abdominal pain from contractions.

## 2015-03-25 NOTE — Discharge Instructions (Signed)
Back Pain in Pregnancy °Back pain during pregnancy is common. It happens in about half of all pregnancies. It is important for you and your baby that you remain active during your pregnancy. If you feel that back pain is not allowing you to remain active or sleep well, it is time to see your caregiver. Back pain may be caused by several factors related to changes during your pregnancy. Fortunately, unless you had trouble with your back before your pregnancy, the pain is likely to get better after you deliver. °Low back pain usually occurs between the fifth and seventh months of pregnancy. It can, however, happen in the first couple months. Factors that increase the risk of back problems include:  °· Previous back problems. °· Injury to your back. °· Having twins or multiple births. °· A chronic cough. °· Stress. °· Job-related repetitive motions. °· Muscle or spinal disease in the back. °· Family history of back problems, ruptured (herniated) discs, or osteoporosis. °· Depression, anxiety, and panic attacks. °CAUSES  °· When you are pregnant, your body produces a hormone called relaxin. This hormone makes the ligaments connecting the low back and pubic bones more flexible. This flexibility allows the baby to be delivered more easily. When your ligaments are loose, your muscles need to work harder to support your back. Soreness in your back can come from tired muscles. Soreness can also come from back tissues that are irritated since they are receiving less support. °· As the baby grows, it puts pressure on the nerves and blood vessels in your pelvis. This can cause back pain. °· As the baby grows and gets heavier during pregnancy, the uterus pushes the stomach muscles forward and changes your center of gravity. This makes your back muscles work harder to maintain good posture. °SYMPTOMS  °Lumbar pain during pregnancy °Lumbar pain during pregnancy usually occurs at or above the waist in the center of the back. There  may be pain and numbness that radiates into your leg or foot. This is similar to low back pain experienced by non-pregnant women. It usually increases with sitting for long periods of time, standing, or repetitive lifting. Tenderness may also be present in the muscles along your upper back. °Posterior pelvic pain during pregnancy °Pain in the back of the pelvis is more common than lumbar pain in pregnancy. It is a deep pain felt in your side at the waistline, or across the tailbone (sacrum), or in both places. You may have pain on one or both sides. This pain can also go into the buttocks and backs of the upper thighs. Pubic and groin pain may also be present. The pain does not quickly resolve with rest, and morning stiffness may also be present. °Pelvic pain during pregnancy can be brought on by most activities. A high level of fitness before and during pregnancy may or may not prevent this problem. Labor pain is usually 1 to 2 minutes apart, lasts for about 1 minute, and involves a bearing down feeling or pressure in your pelvis. However, if you are at term with the pregnancy, constant low back pain can be the beginning of early labor, and you should be aware of this. °DIAGNOSIS  °X-rays of the back should not be done during the first 12 to 14 weeks of the pregnancy and only when absolutely necessary during the rest of the pregnancy. MRIs do not give off radiation and are safe during pregnancy. MRIs also should only be done when absolutely necessary. °HOME CARE INSTRUCTIONS °· Exercise   as directed by your caregiver. Exercise is the most effective way to prevent or manage back pain. If you have a back problem, it is especially important to avoid sports that require sudden body movements. Swimming and walking are great activities. °· Do not stand in one place for long periods of time. °· Do not wear high heels. °· Sit in chairs with good posture. Use a pillow on your lower back if necessary. Make sure your head  rests over your shoulders and is not hanging forward. °· Try sleeping on your side, preferably the left side, with a pillow or two between your legs. If you are sore after a night's rest, your bed may be too soft. Try placing a board between your mattress and box spring. °· Listen to your body when lifting. If you are experiencing pain, ask for help or try bending your knees more so you can use your leg muscles rather than your back muscles. Squat down when picking up something from the floor. Do not bend over. °· Eat a healthy diet. Try to gain weight within your caregiver's recommendations. °· Use heat or cold packs 3 to 4 times a day for 15 minutes to help with the pain. °· Only take over-the-counter or prescription medicines for pain, discomfort, or fever as directed by your caregiver. °Sudden (acute) back pain °· Use bed rest for only the most extreme, acute episodes of back pain. Prolonged bed rest over 48 hours will aggravate your condition. °· Ice is very effective for acute conditions. °¨ Put ice in a plastic bag. °¨ Place a towel between your skin and the bag. °¨ Leave the ice on for 10 to 20 minutes every 2 hours, or as needed. °· Using heat packs for 30 minutes prior to activities is also helpful. °Continued back pain °See your caregiver if you have continued problems. Your caregiver can help or refer you for appropriate physical therapy. With conditioning, most back problems can be avoided. Sometimes, a more serious issue may be the cause of back pain. You should be seen right away if new problems seem to be developing. Your caregiver may recommend: °· A maternity girdle. °· An elastic sling. °· A back brace. °· A massage therapist or acupuncture. °SEEK MEDICAL CARE IF:  °· You are not able to do most of your daily activities, even when taking the pain medicine you were given. °· You need a referral to a physical therapist or chiropractor. °· You want to try acupuncture. °SEEK IMMEDIATE MEDICAL CARE  IF: °· You develop numbness, tingling, weakness, or problems with the use of your arms or legs. °· You develop severe back pain that is no longer relieved with medicines. °· You have a sudden change in bowel or bladder control. °· You have increasing pain in other areas of the body. °· You develop shortness of breath, dizziness, or fainting. °· You develop nausea, vomiting, or sweating. °· You have back pain which is similar to labor pains. °· You have back pain along with your water breaking or vaginal bleeding. °· You have back pain or numbness that travels down your leg. °· Your back pain developed after you fell. °· You develop pain on one side of your back. You may have a kidney stone. °· You see blood in your urine. You may have a bladder infection or kidney stone. °· You have back pain with blisters. You may have shingles. °Back pain is fairly common during pregnancy but should not be accepted as just part of   the process. Back pain should always be treated as soon as possible. This will make your pregnancy as pleasant as possible. Document Released: 01/24/2006 Document Revised: 01/08/2012 Document Reviewed: 03/07/2011 [facility] Patient Information 2015 [place], [place]. This information is not intended to replace advice given to you by your health care provider. Make sure you discuss any questions you have with your health care provider. Third Trimester of Pregnancy The third trimester is from week 29 through week 42, months 7 through 9. The third trimester is a time when the fetus is growing rapidly. At the end of the ninth month, the fetus is about 20 inches in length and weighs 6-10 pounds.  BODY CHANGES Your body goes through many changes during pregnancy. The changes vary from woman to woman.   Your weight will continue to increase. You can expect to gain 25-35 pounds (11-16 kg) by the end of the pregnancy.  You may begin to get stretch marks on your hips, abdomen, and breasts.  You may urinate  more often because the fetus is moving lower into your pelvis and pressing on your bladder.  You may develop or continue to have heartburn as a result of your pregnancy.  You may develop constipation because certain hormones are causing the muscles that push waste through your intestines to slow down.  You may develop hemorrhoids or swollen, bulging veins (varicose veins).  You may have pelvic pain because of the weight gain and pregnancy hormones relaxing your joints between the bones in your pelvis. Backaches may result from overexertion of the muscles supporting your posture.  You may have changes in your hair. These can include thickening of your hair, rapid growth, and changes in texture. Some women also have hair loss during or after pregnancy, or hair that feels dry or thin. Your hair will most likely return to normal after your baby is born.  Your breasts will continue to grow and be tender. A yellow discharge may leak from your breasts called colostrum.  Your belly button may stick out.  You may feel short of breath because of your expanding uterus.  You may notice the fetus "dropping," or moving lower in your abdomen.  You may have a bloody mucus discharge. This usually occurs a few days to a week before labor begins.  Your cervix becomes thin and soft (effaced) near your due date. WHAT TO EXPECT AT YOUR PRENATAL EXAMS  You will have prenatal exams every 2 weeks until week 36. Then, you will have weekly prenatal exams. During a routine prenatal visit:  You will be weighed to make sure you and the fetus are growing normally.  Your blood pressure is taken.  Your abdomen will be measured to track your baby's growth.  The fetal heartbeat will be listened to.  Any test results from the previous visit will be discussed.  You may have a cervical check near your due date to see if you have effaced. At around 36 weeks, your caregiver will check your cervix. At the same time,  your caregiver will also perform a test on the secretions of the vaginal tissue. This test is to determine if a type of bacteria, Group B streptococcus, is present. Your caregiver will explain this further. Your caregiver may ask you:  What your birth plan is.  How you are feeling.  If you are feeling the baby move.  If you have had any abnormal symptoms, such as leaking fluid, bleeding, severe headaches, or abdominal cramping.  If you  have any questions. Other tests or screenings that may be performed during your third trimester include:  Blood tests that check for low iron levels (anemia).  Fetal testing to check the health, activity level, and growth of the fetus. Testing is done if you have certain medical conditions or if there are problems during the pregnancy. FALSE LABOR You may feel small, irregular contractions that eventually go away. These are called Braxton Hicks contractions, or false labor. Contractions may last for hours, days, or even weeks before true labor sets in. If contractions come at regular intervals, intensify, or become painful, it is best to be seen by your caregiver.  SIGNS OF LABOR   Menstrual-like cramps.  Contractions that are 5 minutes apart or less.  Contractions that start on the top of the uterus and spread down to the lower abdomen and back.  A sense of increased pelvic pressure or back pain.  A watery or bloody mucus discharge that comes from the vagina. If you have any of these signs before the 37th week of pregnancy, call your caregiver right away. You need to go to the hospital to get checked immediately. HOME CARE INSTRUCTIONS   Avoid all smoking, herbs, alcohol, and unprescribed drugs. These chemicals affect the formation and growth of the baby.  Follow your caregiver's instructions regarding medicine use. There are medicines that are either safe or unsafe to take during pregnancy.  Exercise only as directed by your caregiver.  Experiencing uterine cramps is a good sign to stop exercising.  Continue to eat regular, healthy meals.  Wear a good support bra for breast tenderness.  Do not use hot tubs, steam rooms, or saunas.  Wear your seat belt at all times when driving.  Avoid raw meat, uncooked cheese, cat litter boxes, and soil used by cats. These carry germs that can cause birth defects in the baby.  Take your prenatal vitamins.  Try taking a stool softener (if your caregiver approves) if you develop constipation. Eat more high-fiber foods, such as fresh vegetables or fruit and whole grains. Drink plenty of fluids to keep your urine clear or pale yellow.  Take warm sitz baths to soothe any pain or discomfort caused by hemorrhoids. Use hemorrhoid cream if your caregiver approves.  If you develop varicose veins, wear support hose. Elevate your feet for 15 minutes, 3-4 times a day. Limit salt in your diet.  Avoid heavy lifting, wear low heal shoes, and practice good posture.  Rest a lot with your legs elevated if you have leg cramps or low back pain.  Visit your dentist if you have not gone during your pregnancy. Use a soft toothbrush to brush your teeth and be gentle when you floss.  A sexual relationship may be continued unless your caregiver directs you otherwise.  Do not travel far distances unless it is absolutely necessary and only with the approval of your caregiver.  Take prenatal classes to understand, practice, and ask questions about the labor and delivery.  Make a trial run to the hospital.  Pack your hospital bag.  Prepare the baby's nursery.  Continue to go to all your prenatal visits as directed by your caregiver. SEEK MEDICAL CARE IF:  You are unsure if you are in labor or if your water has broken.  You have dizziness.  You have mild pelvic cramps, pelvic pressure, or nagging pain in your abdominal area.  You have persistent nausea, vomiting, or diarrhea.  You have a bad  smelling vaginal discharge.  You have pain with urination. SEEK IMMEDIATE MEDICAL CARE IF:   You have a fever.  You are leaking fluid from your vagina.  You have spotting or bleeding from your vagina.  You have severe abdominal cramping or pain.  You have rapid weight loss or gain.  You have shortness of breath with chest pain.  You notice sudden or extreme swelling of your face, hands, ankles, feet, or legs.  You have not felt your baby move in over an hour.  You have severe headaches that do not go away with medicine.  You have vision changes. Document Released: 10/10/2001 Document Revised: 10/21/2013 Document Reviewed: 12/17/2012 [facility] Patient Information 2015 [place], [place]. This information is not intended to replace advice given to you by your health care provider. Make sure you discuss any questions you have with your health care provider.

## 2015-03-31 ENCOUNTER — Encounter (HOSPITAL_COMMUNITY): Payer: Self-pay

## 2015-03-31 ENCOUNTER — Inpatient Hospital Stay (HOSPITAL_COMMUNITY)
Admission: AD | Admit: 2015-03-31 | Discharge: 2015-04-03 | DRG: 774 | Disposition: A | Discharge status: Home / Self Care | Payer: BLUE CROSS/BLUE SHIELD | Source: Ambulatory Visit | Attending: Obstetrics and Gynecology | Admitting: Obstetrics and Gynecology

## 2015-03-31 DIAGNOSIS — O2412 Pre-existing diabetes mellitus, type 2, in childbirth: Secondary | ICD-10-CM | POA: Diagnosis present

## 2015-03-31 DIAGNOSIS — O1002 Pre-existing essential hypertension complicating childbirth: Secondary | ICD-10-CM | POA: Diagnosis present

## 2015-03-31 DIAGNOSIS — E119 Type 2 diabetes mellitus without complications: Secondary | ICD-10-CM | POA: Diagnosis present

## 2015-03-31 DIAGNOSIS — O42913 Preterm premature rupture of membranes, unspecified as to length of time between rupture and onset of labor, third trimester: Secondary | ICD-10-CM | POA: Diagnosis present

## 2015-03-31 DIAGNOSIS — Z91040 Latex allergy status: Secondary | ICD-10-CM | POA: Diagnosis not present

## 2015-03-31 DIAGNOSIS — Z833 Family history of diabetes mellitus: Secondary | ICD-10-CM | POA: Diagnosis not present

## 2015-03-31 DIAGNOSIS — O9902 Anemia complicating childbirth: Secondary | ICD-10-CM | POA: Diagnosis present

## 2015-03-31 DIAGNOSIS — Z882 Allergy status to sulfonamides status: Secondary | ICD-10-CM | POA: Diagnosis not present

## 2015-03-31 DIAGNOSIS — A6000 Herpesviral infection of urogenital system, unspecified: Secondary | ICD-10-CM | POA: Diagnosis present

## 2015-03-31 DIAGNOSIS — O1403 Mild to moderate pre-eclampsia, third trimester: Secondary | ICD-10-CM | POA: Diagnosis present

## 2015-03-31 DIAGNOSIS — Z3A35 35 weeks gestation of pregnancy: Secondary | ICD-10-CM | POA: Diagnosis present

## 2015-03-31 DIAGNOSIS — O9832 Other infections with a predominantly sexual mode of transmission complicating childbirth: Secondary | ICD-10-CM | POA: Diagnosis present

## 2015-03-31 DIAGNOSIS — Z794 Long term (current) use of insulin: Secondary | ICD-10-CM | POA: Diagnosis not present

## 2015-03-31 DIAGNOSIS — O9989 Other specified diseases and conditions complicating pregnancy, childbirth and the puerperium: Secondary | ICD-10-CM | POA: Diagnosis present

## 2015-03-31 DIAGNOSIS — O42011 Preterm premature rupture of membranes, onset of labor within 24 hours of rupture, first trimester: Secondary | ICD-10-CM | POA: Diagnosis present

## 2015-03-31 LAB — TYPE AND SCREEN
ABO/RH(D): B POS
ANTIBODY SCREEN: NEGATIVE

## 2015-03-31 LAB — CBC
HCT: 34.5 % — ABNORMAL LOW (ref 36.0–46.0)
Hemoglobin: 11.2 g/dL — ABNORMAL LOW (ref 12.0–15.0)
MCH: 25.9 pg — ABNORMAL LOW (ref 26.0–34.0)
MCHC: 32.5 g/dL (ref 30.0–36.0)
MCV: 79.7 fL (ref 78.0–100.0)
Platelets: 297 10*3/uL (ref 150–400)
RBC: 4.33 MIL/uL (ref 3.87–5.11)
RDW: 14.8 % (ref 11.5–15.5)
WBC: 16.7 10*3/uL — AB (ref 4.0–10.5)

## 2015-03-31 LAB — POCT FERN TEST: POCT Fern Test: POSITIVE

## 2015-03-31 MED ORDER — LACTATED RINGERS IV SOLN
500.0000 mL | INTRAVENOUS | Status: DC | PRN
  Administered 2015-04-01 (×3): 500 mL via INTRAVENOUS

## 2015-03-31 MED ORDER — OXYTOCIN BOLUS FROM INFUSION: 500.0000 mL | INTRAVENOUS | Status: DC

## 2015-03-31 MED ORDER — OXYCODONE-ACETAMINOPHEN 5-325 MG PO TABS: 1.0000 | ORAL_TABLET | ORAL | Status: DC | PRN

## 2015-03-31 MED ORDER — OXYCODONE-ACETAMINOPHEN 5-325 MG PO TABS: 2.0000 | ORAL_TABLET | ORAL | Status: DC | PRN

## 2015-03-31 MED ORDER — OXYTOCIN 40 UNITS IN LACTATED RINGERS INFUSION - SIMPLE MED
62.5000 mL/h | INTRAVENOUS | Status: DC
  Administered 2015-04-01: 62.5 mL/h via INTRAVENOUS

## 2015-03-31 MED ORDER — ACETAMINOPHEN 325 MG PO TABS: 650.0000 mg | ORAL_TABLET | ORAL | Status: DC | PRN

## 2015-03-31 MED ORDER — FENTANYL CITRATE (PF) 100 MCG/2ML IJ SOLN
50.0000 ug | INTRAMUSCULAR | Status: DC | PRN
  Administered 2015-04-01: 100 ug via INTRAVENOUS
  Administered 2015-04-01: 50 ug via INTRAVENOUS
  Filled 2015-03-31 (×2): qty 2

## 2015-03-31 MED ORDER — CITRIC ACID-SODIUM CITRATE 334-500 MG/5ML PO SOLN: 30.0000 mL | ORAL | Status: DC | PRN

## 2015-03-31 MED ORDER — ONDANSETRON HCL 4 MG/2ML IJ SOLN: 4.0000 mg | Freq: Four times a day (QID) | INTRAMUSCULAR | Status: DC | PRN

## 2015-03-31 MED ORDER — LIDOCAINE HCL (PF) 1 % IJ SOLN
30.0000 mL | INTRAMUSCULAR | Status: DC | PRN
  Filled 2015-03-31: qty 30

## 2015-03-31 MED ORDER — LACTATED RINGERS IV SOLN
INTRAVENOUS | Status: DC
  Administered 2015-03-31: 23:00:00 via INTRAVENOUS

## 2015-03-31 NOTE — H&P (Signed)
Sara [name] is a 29 y.o. female, [phone] at 35 weeks who presents with SROM.  Patient is GBS negative and s/p bmz course on 5/14.  Patient was dx'd w/ Type II DM 3 yrs ago after last delivery and currently controls w/ insulin.  Patient Active Problem List   Diagnosis Date Noted  . Pre-eclampsia in third trimester 03/16/2015  . Pre-existing type 2 diabetes mellitus in pregnancy in third trimester   . Preterm contractions 03/13/2015  . Diabetes mellitus type 2, controlled, without complications 74/05/1447  . BMI 36.0-36.9,adult 03/12/2015  . Herpes simplex type 2 (HSV-2) infection affecting pregnancy, antepartum 03/12/2015  . Hemorrhoids 03/12/2015  . Latex allergy - rash 03/12/2015  . 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 History of migraine headaches - with aura 12/12/2014  . History of preterm delivery, currently pregnant 12/05/2014  . Generalized anxiety disorder 12/05/2014  . Anemia 12/05/2014  . Pilonidal abscess 09/30/2012  PP preeclampsia in last pregnancy - started on Magnesium, d/c'd home w/ Labetolol Allergy to Terazol 3 -- mild rash - pt cannot use Terazol 7 0.4% cream  History of present pregnancy: Patient entered care at 6.6 weeks.   EDC of 05/05/15 was established by sure LMP and that was c/w 6.3 wk scan.   Anatomy scan: 19.0 weeks, with spine anatomy not well seen due to position, and an anterior placenta. Small amount of color flow seen in the ventricle septum - scheduled for fetal echo.  Additional [place] evaluations:  6.3 wks - dating and viability: Viable IUP, c/w dating. 1 cm [place].  13.1 wks - first trimester screen: Singleton pregnancy. Anteverted uterus. Placenta appears anterior. NT = 1.5 mm - amnion seen. Adnexas WNLs. Previously seen [facility] not visualized on today's scan. 22.5 wks - f/u anatomy: Spine seen. Anatomy complete. 28.0 wks - Growth DM: EFW 2+10, 48th%tile. AFI 21.0 cm = 80th%tile - upper  normal.  33.2wks-U/S reviewed: EFW 2335g, appropriate interval growth, AFI 21.27cm. Significant prenatal events: Back pain throughout pregnancy - comfort measures reviewed. Right knee pain around 14 1/2 wks - management options reviewed. Diagnosed w/ type 2 DM 2 1/2-3 yrs ago (after birth of only child) -- followed by Dr. Sheron Nightingale at [facility]. Sugars up and down between 8-9 wks. Was to see Endo at 20 wks -- did not keep appointment. Seen by Nutritionist on 10/01/14 and meals adjusted. MFM referral placed due to recs for Januvia use in pregnancy and seen on 10/06/14. Recs included changing insulin to Novolog w/ meals, FS 4x/day, ROB visit q 2 wks, detailed anatomy scan btw 18-20 wks, fetal echo btw 20-22 wks, serial growth scans in the third trimester, APFT beginning at 32 wks and delivery at 39 wks in the absence of other complications. Final adjustment of insulin = 35 units of Novolog immediately post meals and 48 units of Lantus at bedtime. Hx of elevated BPs - originally on Labetalol, but did not continue due to "normal" BPs. Normal baseline preeclampsia labs w/ the exception of plts at 466 - subsequent platelet levels normal. H/O PTB at 35 wks due to Gestational HTN - was diagnosed w/ preeclampsia within 24-48 hrs postpartum, readmitted and started on Magnesium Sulfate x 24 hrs. Labetalol prescribed and pt discontinued after having only taking a few weeks. Has not required any medication this pregnancy -- more preeclampsia labs completed and normal, to include 24-hr urine. 17P offered and declined. Treated for BV  on 11/24/14. H/O migraine h/a, relieved only w/ T3 and an "occasional" coke - was referred to [facility] and seen on 12/15/14. Recs included monitoring sxs and T3 prn. Applied for short term disability due to frequent headaches and loss of work. Seen in MAU at 18.3 wks for migraine management and noted to have elevated BPs at that time. Previously took Lisinopril for kidney  protection due to DM. Was started on Labetalol this visit (18.3 wks) and reported "palpitations and dizziness" after first dose -- pt d/c'd use and accepted risk of stopping med which was supported. Had one elevated BP in office (160/80) but reported BPs always elevated with migraines. Seen in MAU around 26+ wks for decreased FM. Non-compliant w/ bringing fingerstick log to office visits. fFN (neg) due to cramping/ongoing back pain at 29.6 wks. Declined flu shot. TWG 25 lbs thus far. Last evaluation: Office on 03/30/15 @ 34.6 wks by L.Eulas Post. FHR 120 bpm. BP 120/80. VE 1/50/-4 OB History    Gravida Para Term Preterm AB TAB SAB Ectopic Multiple Living   3 1  1 1  1   1     2008 @ 4 wks, SAB, no complications per pt report 07/11/2009 @ 35 wks, 13-hr labor, female infant, birthwt 6+5, Epidural, WHG - GDM and Gestational HTN "Gabriel" Past Medical History  Diagnosis Date  . Infected pilonidal cyst   . Anxiety     diagnosed in 2007  . Diabetes mellitus     type 2 - uncontrolled  . Hypertension     benign essential  . Migraine   . Non-reassuring fetal heart rate or rhythm affecting mother    Past Surgical History  Procedure Laterality Date  . No past surgeries     Family History: family history includes Diabetes in her maternal grandmother, mother, and paternal grandmother; Hypertension in her father; Migraines in her mother.HTN in her PGF, asthma in her brother and anxiety in her mother. Social History:  reports that she has never smoked. She does not have any smokeless tobacco history on file. She reports that she does not drink alcohol or use illicit drugs.Pt is African American, high school graduate and is employed as a Estate agent. She is of the [place] and will accept blood in an emergency. FOB Javontti Reynolds present and involved.   Prenatal Transfer Tool  Maternal Diabetes: Yes:  Diabetes Type:  Pre-pregnancy, Insulin/Medication controlled Genetic Screening: Normal ([place], first  trimester screen and MSAFP) Maternal Ultrasounds/Referrals: Normal Fetal Ultrasounds or other Referrals:  Fetal echo, Referred to Materal Fetal Medicine  Maternal Substance Abuse:  No Significant Maternal Medications:  Meds include: Other: PNVs, FeS04, Novolog, Lantus, Zyrtec, Tyl #3 Significant Maternal Lab Results: Lab values include: Group B Strep negative, Other: HSV-2  TDAP NA Flu: Declined  ROS: As noted in HPI  Allergies  Allergen Reactions  . Septra Ds [Sulfamethoxazole-Trimethoprim] Hives and Itching  . Lobster [Shellfish Allergy] Itching    Throat itching  . Latex Rash       Blood pressure 160/98, pulse 113, temperature 98.5 F (36.9 C), temperature source Oral, resp. rate 16, last menstrual period 07/29/2014, SpO2 98 %.  Chest clear Heart RRR without murmur Abd gravid, NT Pelvic: Deferred at current Ext: WNL  FHR: Category 1 IOX:BDZH  Prenatal labs: ABO, Rh: --/--/B POS (05/17 0553) Antibody: NEG (05/17 0553) Rubella:   Immune RPR: Nonreactive (11/09 0000)  HBsAg: Negative (11/09 0000)  HIV: Non-reactive (11/09 0000)  GBS: Negative (05/13 0000)Neg via PCR on  03/12/15 Sickle cell/Hgb electrophoresis: No record Pap: No record GC: Neg 09/07/14 Chlamydia: Neg 09/07/14 Genetic screenings: Neg Glucola: 132 on 02/23/15 Other: Hbg A1C 8.2 at 6.6 wks and 7.3 at 17.6 wks. Creatinine 24 hr urine = 1906 on 09/07/14. Normal tox labs. Hgb 11.8 at NOB, 10.2 on 01/30/15 Results for orders placed or performed during the hospital encounter of 03/31/15 (from the past 72 hour(s))  Fern Test     Status: Normal   Collection Time: 03/31/15  9:47 PM  Result Value Ref Range   POCT Fern Test Positive = ruptured amniotic membanes     Assessment/: IUP at 35 wks IDDM PreEclampsia w/o severe features GBS neg SROM  Plan: Admit to [facility] per consult with Dr. Carmon Sails Routine Labor and Delivery Orders per [facility] Protocol Patient opts to start augmentation after  eating Will adjust insulin per current orders  Scotlyn Mccranie, [place], [place] 03/13/2015, 01:33

## 2015-03-31 NOTE — MAU Note (Signed)
Pt reports ROM at 2112, denies contractions.

## 2015-04-01 ENCOUNTER — Inpatient Hospital Stay (HOSPITAL_COMMUNITY): Payer: BLUE CROSS/BLUE SHIELD | Admitting: Anesthesiology

## 2015-04-01 ENCOUNTER — Encounter (HOSPITAL_COMMUNITY): Payer: Self-pay | Admitting: Anesthesiology

## 2015-04-01 LAB — CBC
HEMATOCRIT: 31.3 % — AB (ref 36.0–46.0)
HEMOGLOBIN: 10.2 g/dL — AB (ref 12.0–15.0)
MCH: 25.9 pg — ABNORMAL LOW (ref 26.0–34.0)
MCHC: 32.6 g/dL (ref 30.0–36.0)
MCV: 79.4 fL (ref 78.0–100.0)
Platelets: 248 10*3/uL (ref 150–400)
RBC: 3.94 MIL/uL (ref 3.87–5.11)
RDW: 14.5 % (ref 11.5–15.5)
WBC: 18.8 10*3/uL — AB (ref 4.0–10.5)

## 2015-04-01 LAB — COMPREHENSIVE METABOLIC PANEL
ALBUMIN: 3 g/dL — AB (ref 3.5–5.0)
ALT: 12 U/L — AB (ref 14–54)
AST: 13 U/L — AB (ref 15–41)
Alkaline Phosphatase: 76 U/L (ref 38–126)
Anion gap: 5 (ref 5–15)
BUN: 6 mg/dL (ref 6–20)
CO2: 24 mmol/L (ref 22–32)
CREATININE: 0.61 mg/dL (ref 0.44–1.00)
Calcium: 8.8 mg/dL — ABNORMAL LOW (ref 8.9–10.3)
Chloride: 106 mmol/L (ref 101–111)
GFR calc Af Amer: >60 mL/min (ref >60)
GLUCOSE: 121 mg/dL — AB (ref 65–99)
Potassium: 4.2 mmol/L (ref 3.5–5.1)
SODIUM: 135 mmol/L (ref 135–145)
Total Bilirubin: 0.4 mg/dL (ref 0.3–1.2)
Total Protein: 6.3 g/dL — ABNORMAL LOW (ref 6.5–8.1)

## 2015-04-01 LAB — GLUCOSE, CAPILLARY
GLUCOSE-CAPILLARY: 138 mg/dL — AB (ref 65–99)
Glucose-Capillary: 109 mg/dL — ABNORMAL HIGH (ref 65–99)
Glucose-Capillary: 118 mg/dL — ABNORMAL HIGH (ref 65–99)
Glucose-Capillary: 121 mg/dL — ABNORMAL HIGH (ref 65–99)
Glucose-Capillary: 97 mg/dL (ref 65–99)

## 2015-04-01 LAB — PROTEIN / CREATININE RATIO, URINE
CREATININE, URINE: 104 mg/dL
Protein Creatinine Ratio: 0.54 mg/mg{Cre} — ABNORMAL HIGH (ref 0.00–0.15)
Total Protein, Urine: 56 mg/dL

## 2015-04-01 LAB — URIC ACID: Uric Acid, Serum: 4 mg/dL (ref 2.3–6.6)

## 2015-04-01 LAB — LACTATE DEHYDROGENASE: LDH: 130 U/L (ref 98–192)

## 2015-04-01 LAB — RPR: RPR Ser Ql: NONREACTIVE

## 2015-04-01 LAB — HIV ANTIBODY (ROUTINE TESTING W REFLEX): HIV Screen 4th Generation wRfx: NONREACTIVE

## 2015-04-01 MED ORDER — LANOLIN HYDROUS EX OINT
TOPICAL_OINTMENT | CUTANEOUS | Status: DC | PRN
Start: 2015-04-01 — End: 2015-04-03

## 2015-04-01 MED ORDER — ZOLPIDEM TARTRATE 5 MG PO TABS: 5.0000 mg | ORAL_TABLET | Freq: Every evening | ORAL | Status: DC | PRN

## 2015-04-01 MED ORDER — LABETALOL HCL 5 MG/ML IV SOLN: 20.0000 mg | INTRAVENOUS | Status: DC | PRN

## 2015-04-01 MED ORDER — ACETAMINOPHEN 325 MG PO TABS: 650.0000 mg | ORAL_TABLET | ORAL | Status: DC | PRN

## 2015-04-01 MED ORDER — OXYCODONE-ACETAMINOPHEN 5-325 MG PO TABS: 2.0000 | ORAL_TABLET | ORAL | Status: DC | PRN

## 2015-04-01 MED ORDER — HYDRALAZINE HCL 20 MG/ML IJ SOLN: 10.0000 mg | Freq: Once | INTRAMUSCULAR | Status: DC | PRN

## 2015-04-01 MED ORDER — PHENYLEPHRINE 40 MCG/ML (10ML) SYRINGE FOR IV PUSH (FOR BLOOD PRESSURE SUPPORT)
80.0000 ug | PREFILLED_SYRINGE | INTRAVENOUS | Status: DC | PRN
  Filled 2015-04-01: qty 2

## 2015-04-01 MED ORDER — OXYCODONE-ACETAMINOPHEN 5-325 MG PO TABS
1.0000 | ORAL_TABLET | ORAL | Status: DC | PRN
  Administered 2015-04-01 – 2015-04-03 (×7): 1 via ORAL
  Filled 2015-04-01 (×7): qty 1

## 2015-04-01 MED ORDER — SIMETHICONE 80 MG PO CHEW: 80.0000 mg | CHEWABLE_TABLET | ORAL | Status: DC | PRN

## 2015-04-01 MED ORDER — EPHEDRINE 5 MG/ML INJ
10.0000 mg | INTRAVENOUS | Status: DC | PRN
  Filled 2015-04-01: qty 2

## 2015-04-01 MED ORDER — INSULIN GLARGINE 100 UNIT/ML ~~LOC~~ SOLN
48.0000 [IU] | Freq: Every day | SUBCUTANEOUS | Status: DC
  Administered 2015-04-01: 24 [IU] via SUBCUTANEOUS
  Filled 2015-04-01: qty 0.48

## 2015-04-01 MED ORDER — INSULIN ASPART 100 UNIT/ML ~~LOC~~ SOLN
35.0000 [IU] | Freq: Three times a day (TID) | SUBCUTANEOUS | Status: DC
  Administered 2015-04-01: 22:00:00 via SUBCUTANEOUS

## 2015-04-01 MED ORDER — PRENATAL MULTIVITAMIN CH
1.0000 | ORAL_TABLET | Freq: Every day | ORAL | Status: DC
  Administered 2015-04-02 – 2015-04-03 (×2): 1 via ORAL
  Filled 2015-04-01 (×2): qty 1

## 2015-04-01 MED ORDER — TETANUS-DIPHTH-ACELL PERTUSSIS 5-2.5-18.5 LF-MCG/0.5 IM SUSP
0.5000 mL | Freq: Once | INTRAMUSCULAR | Status: AC
  Administered 2015-04-03: 0.5 mL via INTRAMUSCULAR
  Filled 2015-04-01: qty 0.5

## 2015-04-01 MED ORDER — INSULIN GLARGINE 100 UNIT/ML ~~LOC~~ SOLN
24.0000 [IU] | Freq: Every day | SUBCUTANEOUS | Status: DC
  Filled 2015-04-01: qty 0.24

## 2015-04-01 MED ORDER — LIDOCAINE HCL (PF) 1 % IJ SOLN
INTRAMUSCULAR | Status: DC | PRN
  Administered 2015-04-01 (×2): 8 mL

## 2015-04-01 MED ORDER — INSULIN ASPART 100 UNIT/ML ~~LOC~~ SOLN: 35.0000 [IU] | Freq: Three times a day (TID) | SUBCUTANEOUS | Status: DC

## 2015-04-01 MED ORDER — TERBUTALINE SULFATE 1 MG/ML IJ SOLN
0.2500 mg | Freq: Once | INTRAMUSCULAR | Status: DC | PRN
  Filled 2015-04-01: qty 1

## 2015-04-01 MED ORDER — OXYTOCIN 40 UNITS IN LACTATED RINGERS INFUSION - SIMPLE MED
1.0000 m[IU]/min | INTRAVENOUS | Status: DC
  Administered 2015-04-01: 2 m[IU]/min via INTRAVENOUS
  Filled 2015-04-01: qty 1000

## 2015-04-01 MED ORDER — PHENYLEPHRINE 40 MCG/ML (10ML) SYRINGE FOR IV PUSH (FOR BLOOD PRESSURE SUPPORT)
PREFILLED_SYRINGE | INTRAVENOUS | Status: AC
  Filled 2015-04-01: qty 20

## 2015-04-01 MED ORDER — INSULIN ASPART 100 UNIT/ML ~~LOC~~ SOLN: 0.0000 [IU] | Freq: Three times a day (TID) | SUBCUTANEOUS | Status: DC

## 2015-04-01 MED ORDER — INSULIN GLARGINE 100 UNIT/ML ~~LOC~~ SOLN
48.0000 [IU] | Freq: Every day | SUBCUTANEOUS | Status: DC
  Administered 2015-04-01: 48 [IU] via SUBCUTANEOUS
  Filled 2015-04-01 (×3): qty 0.48

## 2015-04-01 MED ORDER — FENTANYL 2.5 MCG/ML BUPIVACAINE 1/10 % EPIDURAL INFUSION (WH - ANES)
INTRAMUSCULAR | Status: AC
  Filled 2015-04-01: qty 125

## 2015-04-01 MED ORDER — DIPHENHYDRAMINE HCL 50 MG/ML IJ SOLN: 12.5000 mg | INTRAMUSCULAR | Status: DC | PRN

## 2015-04-01 MED ORDER — SENNOSIDES-DOCUSATE SODIUM 8.6-50 MG PO TABS
2.0000 | ORAL_TABLET | ORAL | Status: DC
  Administered 2015-04-01: 2 via ORAL
  Filled 2015-04-01 (×2): qty 2

## 2015-04-01 MED ORDER — ONDANSETRON HCL 4 MG PO TABS: 4.0000 mg | ORAL_TABLET | ORAL | Status: DC | PRN

## 2015-04-01 MED ORDER — DIPHENHYDRAMINE HCL 25 MG PO CAPS: 25.0000 mg | ORAL_CAPSULE | Freq: Four times a day (QID) | ORAL | Status: DC | PRN

## 2015-04-01 MED ORDER — INSULIN GLARGINE 100 UNIT/ML ~~LOC~~ SOLN: 24.0000 [IU] | Freq: Every day | SUBCUTANEOUS | Status: DC

## 2015-04-01 MED ORDER — WITCH HAZEL-GLYCERIN EX PADS: 1.0000 "application " | MEDICATED_PAD | CUTANEOUS | Status: DC | PRN

## 2015-04-01 MED ORDER — INSULIN ASPART 100 UNIT/ML ~~LOC~~ SOLN: 15.0000 [IU] | SUBCUTANEOUS | Status: DC

## 2015-04-01 MED ORDER — FENTANYL 2.5 MCG/ML BUPIVACAINE 1/10 % EPIDURAL INFUSION (WH - ANES)
14.0000 mL/h | INTRAMUSCULAR | Status: DC | PRN
  Administered 2015-04-01 (×2): 14 mL/h via EPIDURAL

## 2015-04-01 MED ORDER — BENZOCAINE-MENTHOL 20-0.5 % EX AERO
1.0000 "application " | INHALATION_SPRAY | CUTANEOUS | Status: DC | PRN
  Administered 2015-04-01: 1 via TOPICAL
  Filled 2015-04-01: qty 56

## 2015-04-01 MED ORDER — IBUPROFEN 600 MG PO TABS
600.0000 mg | ORAL_TABLET | Freq: Four times a day (QID) | ORAL | Status: DC
  Administered 2015-04-01 – 2015-04-03 (×8): 600 mg via ORAL
  Filled 2015-04-01 (×8): qty 1

## 2015-04-01 MED ORDER — ONDANSETRON HCL 4 MG/2ML IJ SOLN: 4.0000 mg | INTRAMUSCULAR | Status: DC | PRN

## 2015-04-01 MED ORDER — DIBUCAINE 1 % RE OINT: 1.0000 "application " | TOPICAL_OINTMENT | RECTAL | Status: DC | PRN

## 2015-04-01 NOTE — Progress Notes (Addendum)
SALIMAH MARTINOVICH MRN: [id number]  Subjective: -Patient reports comfort s/p IV pain medication.   Objective: BP 141/89 mmHg  Pulse 112  Temp(Src) 98.4 F (36.9 C) (Oral)  Resp 20  Ht 5\' 2"  (1.575 m)  Wt 92.08 kg (203 lb)  BMI 37.12 kg/m2  SpO2 98%  LMP 07/29/2014     FHT: 145 bpm, Mod Var, + Variable Decels, +Accels UC: Irregular   SVE:   Dilation: 5 Effacement (%): 50 Station: -2 Exam by:: Leigh Aurora, RN Membranes:SROM at 2115 Pitocin:20mUn/min  Assessment:  IUP at 35.1wks Cat II FT PPROM Pitocin Augmentation  Plan: -Position change to promote resolution of Cat II FT -Collect CBG as ordered -Patient denies needs at current -Continue other mgmt as ordered  [facility], Shamirah Ivan LYNN,MSN, CNM 04/01/2015, 3:35 AM

## 2015-04-01 NOTE — Plan of Care (Signed)
Problem: Phase I Progression Outcomes Goal: Assess per MD/Nurse,Routine-VS,FHR,UC,Head to Toe assess Outcome: Progressing Education provided to patient on sign and symptoms of preeclampsia. Discussed risks of readmission because of previous history of increased blood pressures postpartum. Patient verbalizes understanding and has no further questions.

## 2015-04-01 NOTE — Progress Notes (Signed)
Discussed with Venus Standard, CNM the insulin order set for sliding scale with meals. Patient reported too much and was afraid it was cause her to bottom out. CNM will plan top report to Dr Charlesetta Garibaldi. Orders given to hold med at this time.

## 2015-04-01 NOTE — Anesthesia Postprocedure Evaluation (Signed)
  Anesthesia Post-op Note  Patient: Sara [name]  Procedure(s) Performed: * No procedures listed *  Patient Location: PACU and Mother/Baby  Anesthesia Type:Epidural  Level of Consciousness: awake, alert , oriented and patient cooperative  Airway and Oxygen Therapy: Patient Spontanous Breathing  Post-op Pain: none  Post-op Assessment: Post-op Vital signs reviewed, Patient's Cardiovascular Status Stable, Respiratory Function Stable, Patent Airway, No signs of Nausea or vomiting, Adequate PO intake, Pain level controlled, No headache, No backache, No residual numbness and No residual motor weakness  Post-op Vital Signs: Reviewed and stable  Last Vitals:  Filed Vitals:   04/01/15 1550  BP: 137/83  Pulse: 100  Temp: 36.4 C  Resp: 20    Complications: No apparent anesthesia complications

## 2015-04-01 NOTE — Progress Notes (Signed)
Labor Progress  Subjective: Doing well with the epidural Nurse report SP late decels that ended at approximately 0735 and the pitocin was DC at that time..    Objective: BP 130/80 mmHg  Pulse 107  Temp(Src) 98.3 F (36.8 C) (Axillary)  Resp 18  Ht 5\' 2"  (1.575 m)  Wt 203 lb (92.08 kg)  BMI 37.12 kg/m2  SpO2 100%  LMP 07/29/2014 I/O last 3 completed shifts: In: -  Out: 900 [Urine:900]   FHT: 155, + accel, occasional variable decel CTX:  regular, every 3-4 minutes Uterus gravid, soft non tender SVE:  Dilation: 8 Effacement (%): 80 Station: -2 Exam by:: Macrae Wiegman-CNM Pitocin at 64mUn/min  Assessment:  IUP at 35.1 weeks NICHD: Category SP 3 currently category 1 Membranes:  SROM x 0014 hrs, no s/s of infection Labor progress: adquate labor GBS: negative BS 138   Plan: Continue labor plan Continuous monitoring Frequent position changes to facilitate fetal rotation and descent. Will reassess with cervical exam at 1100 or earlier if necessary Restart pitocin at 29mu increase per protocol      Marchell Froman, CNM, MSN 04/01/2015. 9:21 AM

## 2015-04-01 NOTE — Consult Note (Signed)
Neonatology Note:   Attendance at Delivery:    I was asked by Dr. Ihor Dow (standing in for Dr. Charlesetta Garibaldi) to attend this vacuum-assisted vaginal delivery at 35 1/7 weeks due to [facility]. The mother is a G3P1A1 B pos, GBS neg with Type 2 DM, on insulin. She also has had preterm labor and pre-eclampsia, and has a history of HSV-2.  ROM 15 hours prior to delivery, fluid clear. No antibiotics during labor, mother afebrile. There were FHR decelerations with pushing. At delivery, infant vigorous with good spontaneous cry and tone. Needed no suctioning. Ap 8/9. Lungs clear to ausc in DR, no signs of distress, moving air well. I spoke with his parents about the risks for hypoglycemia and hypothermia, but feel he is stable to have skin to skin time. To CN to care of Pediatrician.   Real Cons, MD

## 2015-04-01 NOTE — Plan of Care (Signed)
Telephone call  to CNM for clarification on insulin orders. MD is unavailable to discuss and when return call later.

## 2015-04-01 NOTE — Anesthesia Preprocedure Evaluation (Signed)
Anesthesia Evaluation  Patient identified by MRN, date of birth, ID band Patient awake    Reviewed: Allergy & Precautions, H&P , NPO status , Patient's Chart, lab work & pertinent test results  Airway Mallampati: II  TM Distance: >3 FB Neck ROM: full    Dental no notable dental hx.    Pulmonary neg pulmonary ROS,    Pulmonary exam normal       Cardiovascular hypertension, Pt. on medications Normal cardiovascular exam    Neuro/Psych    GI/Hepatic negative GI ROS, Neg liver ROS,   Endo/Other  negative endocrine ROSdiabetes, Type 2, Insulin Dependent  Renal/GU negative Renal ROS     Musculoskeletal   Abdominal (+) + obese,   Peds  Hematology   Anesthesia Other Findings   Reproductive/Obstetrics (+) Pregnancy                             Anesthesia Physical Anesthesia Plan  ASA: III  Anesthesia Plan: Epidural   Post-op Pain Management:    Induction:   Airway Management Planned:   Additional Equipment:   Intra-op Plan:   Post-operative Plan:   Informed Consent: I have reviewed the patients History and Physical, chart, labs and discussed the procedure including the risks, benefits and alternatives for the proposed anesthesia with the patient or authorized representative who has indicated his/her understanding and acceptance.     Plan Discussed with:   Anesthesia Plan Comments:         Anesthesia Quick Evaluation

## 2015-04-01 NOTE — Progress Notes (Signed)
Delivery of live viable female by Ihor Dow, assisted by Merlyn Albert, CNM ApGARs 8,9 NICU, OBRRRN at bedside for delivery

## 2015-04-01 NOTE — Progress Notes (Signed)
Consult for Diabetes Coordinator placed re: CBG monitoring and SS coverage.  Prior to delivery taking Lantus 48 units at bedtime - order placed to now give 24 units at bedtime.  Dr. Cletis Media updated.   Sara [name], CNM 04/01/15, 11:00 PM

## 2015-04-01 NOTE — Anesthesia Procedure Notes (Signed)
Epidural Patient location during procedure: OB Start time: 04/01/2015 5:59 AM End time: 04/01/2015 6:03 AM  Staffing Anesthesiologist: Lyn Hollingshead Performed by: anesthesiologist   Preanesthetic Checklist Completed: patient identified, surgical consent, pre-op evaluation, timeout performed, IV checked, risks and benefits discussed and monitors and equipment checked  Epidural Patient position: sitting Prep: site prepped and draped and DuraPrep Patient monitoring: continuous pulse ox and blood pressure Approach: midline Location: L3-L4 Injection technique: LOR air  Needle:  Needle type: Tuohy  Needle gauge: 17 G Needle length: 9 cm and 9 Needle insertion depth: 6 cm Catheter type: closed end flexible Catheter size: 19 Gauge Catheter at skin depth: 11 cm Test dose: negative and Other  Assessment Sensory level: T9 Events: blood not aspirated, injection not painful, no injection resistance, negative IV test and no paresthesia  Additional Notes Reason for block:procedure for pain

## 2015-04-01 NOTE — Progress Notes (Addendum)
Sara [name] MRN: [id number]  Subjective: -Patient reports comfort s/p epidural.  Family at bedside  Objective: BP 137/68 mmHg  Pulse 126  Temp(Src) 97.8 F (36.6 C) (Oral)  Resp 20  Ht 5\' 2"  (1.575 m)  Wt 92.08 kg (203 lb)  BMI 37.12 kg/m2  SpO2 100%  LMP 07/29/2014 I/O last 3 completed shifts: In: -  Out: 700 [Urine:700]   FHT: 145 bpm, Mod Var, +Variable Decels, +Accels UC: Q2-57min, palpates mild SVE:   Dilation: 6.5 Effacement (%): 70 Station: -2 Exam by:: Sara [name], CNM  IUPC inserted Membranes:SROM x 10 hrs Pitocin:93mUn/min  Assessment:  IUP at 35.1wks Cat II FT  Pitocin Augmentation  Plan: -IUPC inserted w/o difficulty -Patient refuses FSE placement -Readjust EFM -Position change to promote resolution of Cat II FT -PIH Labs ordered -Continue other mgmt as ordered  [name],MSN, CNM 04/01/2015, 7:20 AM

## 2015-04-01 NOTE — Progress Notes (Addendum)
Sara [name] MRN: [id number]  Subjective: -Patient reports some perception of contractions.  States she just ate a piece of chicken and has not taken nightly Lantus.  Able to verify current insulin regime to include 35U of Novalog TID w/SS as appropriate and 48U of Lantus at HS.  Patient requests start of augmentation process.   Objective: BP 153/89 mmHg  Pulse 114  Temp(Src) 99.2 F (37.3 C) (Oral)  Resp 18  Ht 5\' 2"  (1.575 m)  Wt 92.08 kg (203 lb)  BMI 37.12 kg/m2  SpO2 98%  LMP 07/29/2014     FHT: 155 bpm, Mod Var, + Variable Decels, +Accels UC: Irregular   SVE:   Dilation: 4 Effacement (%): 50 Station: -2 Exam by:: J.Kapri Nero, CNM Membranes:SROM at 2115 Pitocin:Initiated  Assessment:  IUP at 35.1wls Cat II FT  PPROM IDDM Preeclampsia  Plan: -Start pitocin per protocol -Insulin and CBG orders placed -Continue other mgmt as ordered -Okay for epidural when desired  Sara Bucknam LYNN,MSN, CNM 04/01/2015, 12:15 AM

## 2015-04-02 LAB — CBC
HEMATOCRIT: 29.2 % — AB (ref 36.0–46.0)
HEMOGLOBIN: 9.3 g/dL — AB (ref 12.0–15.0)
MCH: 25.6 pg — AB (ref 26.0–34.0)
MCHC: 31.8 g/dL (ref 30.0–36.0)
MCV: 80.4 fL (ref 78.0–100.0)
PLATELETS: 243 10*3/uL (ref 150–400)
RBC: 3.63 MIL/uL — AB (ref 3.87–5.11)
RDW: 14.5 % (ref 11.5–15.5)
WBC: 16 10*3/uL — ABNORMAL HIGH (ref 4.0–10.5)

## 2015-04-02 LAB — GLUCOSE, CAPILLARY
GLUCOSE-CAPILLARY: 95 mg/dL (ref 65–99)
GLUCOSE-CAPILLARY: 85 mg/dL (ref 65–99)
Glucose-Capillary: 60 mg/dL — ABNORMAL LOW (ref 65–99)
Glucose-Capillary: 131 mg/dL — ABNORMAL HIGH (ref 65–99)
Glucose-Capillary: 80 mg/dL (ref 65–99)
Glucose-Capillary: 110 mg/dL — ABNORMAL HIGH (ref 65–99)
Glucose-Capillary: 143 mg/dL — ABNORMAL HIGH (ref 65–99)

## 2015-04-02 MED ORDER — INSULIN GLARGINE 100 UNIT/ML ~~LOC~~ SOLN
20.0000 [IU] | Freq: Every day | SUBCUTANEOUS | Status: DC
  Administered 2015-04-02: 20 [IU] via SUBCUTANEOUS
  Filled 2015-04-02 (×2): qty 0.2

## 2015-04-02 MED ORDER — INSULIN ASPART 100 UNIT/ML ~~LOC~~ SOLN: 0.0000 [IU] | Freq: Three times a day (TID) | SUBCUTANEOUS | Status: DC

## 2015-04-02 NOTE — Progress Notes (Signed)
Hypoglycemic Event  CBG: 60  Treatment: 15 GM carbohydrate snack  Symptoms: Shaky  Follow-up CBG: Time:0130 CBG Result:95  Possible Reasons for Event: Medication regimen: insulin regimen post delivery  Comments/MD notified:K Milana Na, Eli Hose  Remember to initiate Hypoglycemia Order Set & complete

## 2015-04-02 NOTE — Lactation Note (Signed)
This note was copied from the chart of [place]. Lactation Consultation Note Experienced BF mom BF her first child for 3 months, but gave her BM 4 months d/t had enough supply in freezer. Mom has large breast w/everted nipples. Hand expressed w/noted colostrum. LPI information sheet reviewed, baby 35 1/7 weeks. Weight 5'11 at birth. Mom hand expressing and giving to baby. Mom shown how to use DEBP & how to disassemble, clean, & reassemble parts. Discussed supply and demand.Mom knows to pump q3h for 15-20 min.Mom encouraged to do skin-to-skin. Educated about newborn behavior.  Mom encouraged to feed baby 8-12 times/24 hours and with feeding cues. Mom encouraged to waken baby for feeds. Referred to Baby and Me Book in Breastfeeding section Pg. 22-23 for position options and Proper latch demonstration.[place] brochure given w/resources, support groups and [place] services. Patient Name: Sara [name] [id number] Date: 04/02/2015 Reason for consult: Initial assessment   Maternal Data Has patient been taught Hand Expression?: Yes Does the patient have breastfeeding experience prior to this delivery?: Yes  Feeding Feeding Type: Breast Fed Length of feed: 50 min  LATCH Score/Interventions       Type of Nipple: Everted at rest and after stimulation  Comfort (Breast/Nipple): Soft / non-tender           Lactation Tools Discussed/Used Tools: Pump Breast pump type: Double-Electric Breast Pump Pump Review: Setup, frequency, and cleaning;Milk Storage Initiated by:: Sara Stack RN Date initiated:: 04/02/15   Consult Status Consult Status: Follow-up Date: 04/02/15 (in pm) Follow-up type: In-patient    Sara [name], Elta Guadeloupe 04/02/2015, 3:05 AM

## 2015-04-02 NOTE — Progress Notes (Signed)
  CLINICAL SOCIAL WORK MATERNAL/CHILD NOTE  Patient Details  Name: Sara [name] MRN: [id number] Date of Birth: 04/01/2013  Date:  04/02/2015  Clinical Social Worker Initiating Note:  Lucita Ferrara, LCSW Date/ Time Initiated:  04/02/15/1000     Child's Name:  Sara [name]   Legal Guardian:  Parents: Sara [name] and Sara [name]   Need for Interpreter:  None   Date of Referral:  04/01/15     Reason for Referral:  History of anxiety   Referral Source:  [facility]   Address:  [address] [place], [place] [address]  Phone number:  [phone]   Household Members:  Valarie Merino (son, 07/11/2009), Significant Other   Natural Supports (not living in the home):  Immediate Family   Professional Supports: None   Employment: Full-time   Type of Work: Estate agent   Education:    N/A  Museum/gallery curator Resources:  [employer], Multimedia programmer   Other Resources:    None identified  Cultural/Religious Considerations Which May Impact Care:  None reported  Strengths:  Ability to meet basic needs , Home prepared for child , Pediatrician chosen    Risk Factors/Current Problems:   1) History of anxiety: since 2007. MOB endorsed history of anxiety and an occasional panic attack (one time every 6 months). She denied acute symptoms during the pregnancy.  Cognitive State:  Able to Concentrate , Alert , Goal Oriented , Linear Thinking    Mood/Affect:  Happy , Animated, Bright    CSW Assessment:  CSW received consult for history of anxiety.  CSW completed chart review and noted that MOB has history stemming since 2007.  Maternal mood and affect appropriate for the setting.  MOB was pleasant but was not forthcoming with her thoughts and feelings.   MOB was observed to be holding the infant, and no symptoms of anxiety were noted.   MOB acknowledged chronic history of anxiety, and denied acute symptoms during the pregnancy.  She stated that she has occasional panic attacks (about once every 6  months), but shared belief that her anxiety and panic attacks are not a result of parenting or her role as a mother. She denied presence of postpartum depression/anxiety, and stated that she only noted her baseline anxiety since her other child has been born.  CSW reviewed education on postpartum depression and anxiety, and normalized the numerous anxieties and worries that accompany the transition to the postpartum period.  MOB stated that she has previously been prescribed Xanax, but shared that she "rarely" needs it. She also endorsed history of being prescribed an anti-depressant, but shared belief that she is able to "deal with it on my own".    MOB denied additional questions, concerns, or needs at this time. She agreed to contact the CSW if needs arise.  CSW Plan/Description:   1) Patient/Family Education: Perinatal mood and anxiety disorders 2) No Further Intervention Required/No Barriers to Discharge    Sheilah Mins, LCSW 04/02/2015, 11:50 AM

## 2015-04-02 NOTE — Progress Notes (Addendum)
Sara [name]   Subjective: Post Partum Day 1 Vaginal delivery, no laceration Patient up ad lib, denies syncope or dizziness. Reports consuming regular diet without issues and denies N/V No issues with urination and reports bleeding is appropriate  Feeding:  brast Contraceptive plan:   nexplanon  Objective: Temp:  [97.6 F (36.4 C)-98.8 F (37.1 C)] 97.9 F (36.6 C) (06/03 0540) Pulse Rate:  [88-119] 89 (06/03 0540) Resp:  [16-20] 18 (06/03 0540) BP: (123-148)/(66-87) 148/87 mmHg (06/03 0540) SpO2:  [100 %] 100 % (06/03 0540)  Physical Exam:  General: alert and cooperative Ext: WNL, no edema. No evidence of DVT seen on physical exam. Breast: Soft filling Lungs: CTAB Heart RRR without murmur  Abdomen:  Soft, fundus firm, lochia scant, + bowel sounds, non distended, non tender Lochia: appropriate Uterine Fundus: firm Laceration: healing well    Recent Labs  04/01/15 1225 04/02/15 0535  HGB 10.2* 9.3*  HCT 31.3* 29.2*    Assessment S/P Vaginal Delivery-Day 1 Stable  Normal Involution Breastfeeding Circumcision: out patient Hypoglycemic incident at 0100, BS 60  Plan: Continue current care Plan for discharge tomorrow and Breastfeeding Lactation support Insulin regimen per diabetic consult, see note  Romell Cavanah, CNM, MSN 04/02/2015, 11:05 AM   I saw and examined patient and agree with above findings assessment and plan.  Close glucose follow up.  Reviewed recent values: 85/131/95/60/97.  She is checking ac and qhs glucose as per diabetes consult.  Dr. Alesia Richards.

## 2015-04-02 NOTE — Progress Notes (Signed)
Received Diabetes Coordinator consult.  In reviewing the chart, patient has been taking Lantus and Novolog prior to pregnancy and during pregnancy. With CBGs trending low after delivery, recommend Lantus 20 units daily and Novolog SENSITIVE correction scale TID & HS to see how her blood sugars will trend.  Check CBGs before meals and at bedtime.  Will continue to follow while in hospital. Harvel Ricks RN BSN CDE

## 2015-04-02 NOTE — Progress Notes (Signed)
TC from pt's primary care nurse re: pt w/ hypoglycemic event @ ~ 01:30 AM. BG checked and noted to be 60. Orange juice given and blood sugar retaken and noted to be 95. Pt reported feeling better. Will recheck BG at 06:00 AM.   Will consult re: insulin and await recs from Diabetes Coordinator.    Farrel Gordon, CNM 04/02/15, 4:20 AM

## 2015-04-03 LAB — GLUCOSE, CAPILLARY
GLUCOSE-CAPILLARY: 70 mg/dL (ref 65–99)
Glucose-Capillary: 85 mg/dL (ref 65–99)

## 2015-04-03 MED ORDER — OXYCODONE-ACETAMINOPHEN 5-325 MG PO TABS: 1.0000 | ORAL_TABLET | ORAL | Status: DC | PRN

## 2015-04-03 MED ORDER — INSULIN ASPART 100 UNIT/ML ~~LOC~~ SOLN: 0.0000 [IU] | Freq: Three times a day (TID) | SUBCUTANEOUS | Status: DC

## 2015-04-03 MED ORDER — INSULIN GLARGINE 100 UNIT/ML ~~LOC~~ SOLN: 20.0000 [IU] | Freq: Every day | SUBCUTANEOUS | Status: DC

## 2015-04-03 NOTE — Lactation Note (Addendum)
This note was copied from the chart of [place]. Lactation Consultation Note  Patient Name: Sara [name] [id number]'N Date: 04/03/2015 Reason for consult: Follow-up assessment  With this mom of a LPI, now 35 3/7 weeks CGa, and weighs 5 lbs 6.8  At 6 % weight loss. Mom reports she has been breast feeding and pumping, and feed EBM to baby with bottle as supplement. Mom is active with WIC, and is loaning a DEP. I advised her to pump 15-30 minutes every 3 hours, followed by hand expression to increase both milk supply and fat content. The baby is jaundiced, and is gong home on phototherapy. I explained to mom how this will add to baby's being sleepy, and needing supplement. Mom also reports her nipples being slanted after feeding. I explained to mom that this means he is latching shallow, and likely not transferring much milk - thus the importance of pumping and supplement. The baby is be checked at the pediatricians tomorrow, for a bili check. I will advise mom to also have the baby's weight checked at this time. I gave mom information on lactation o/p consult, and encouraged ehr to call for an appointment once home and settled.  [facility] referral faxed for DEP  Maternal Data    Feeding Feeding Type: Breast Milk  LATCH Score/Interventions                      Lactation Tools Discussed/Used WIC Program: Yes Pump Review: Setup, frequency, and cleaning;Milk Storage   Consult Status Consult Status: Complete Follow-up type: Call as needed    Tonna Corner 04/03/2015, 11:36 AM

## 2015-04-03 NOTE — Discharge Summary (Signed)
Vaginal Delivery Discharge Summary  Sara [name]  DOB:    [birth year] MRN:    [id number] CSN:    [id number]  Date of admission:                  03/31/15  Date of discharge:                   04/03/15  Procedures this admission:   VE assisted vaginal delivery due to FHR deceleration  Date of Delivery: 04/01/15  Newborn Data:  Live born female  Birth Weight: 5 lb 12.8 oz (2631 g) APGAR: 8, 9  Home with mother. Name: [name] Circumcision Plan: Outpatient  History of Present Illness:  Ms. Sara [name] is a 29 y.o. female, [id number], who presents at [redacted]w[redacted]d weeks gestation. The patient has been followed at [facility] and Gynecology division of [facility]. She was admitted for rupture of membranes. Her pregnancy has been complicated by:  Patient Active Problem List   Diagnosis Date Noted  . Preterm spontaneous labor with preterm delivery 04/02/2015  . Normal vaginal delivery 04/01/2015  . Preterm premature rupture of membranes (PPROM) with onset of labor within 24 hours of rupture in first trimester, antepartum 03/31/2015  . Pre-eclampsia in third trimester 03/16/2015  . Pre-existing type 2 diabetes mellitus in pregnancy in third trimester   . Preterm contractions 03/13/2015  . Diabetes mellitus type 2, controlled, without complications 00/92/3300  . BMI 36.0-36.9,adult 03/12/2015  . Herpes simplex type 2 (HSV-2) infection affecting pregnancy, antepartum 03/12/2015  . Hemorrhoids 03/12/2015  . Latex allergy - rash 03/12/2015  . 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 History of migraine headaches - with aura 12/12/2014  . History of preterm delivery, currently pregnant 12/05/2014  . Generalized anxiety disorder 12/05/2014  . Anemia 12/05/2014  . Pilonidal abscess 09/30/2012     Hospital Course:   Admitting Dx: IUP at 69 1/7 weeks, SROM, Type 2 DM, pre-eclampsia without  severe features GBS Status:  Negative Delivering Clinician: Dr. Ihor Dow for VE, with Venus Standard, CNM Lacerations/MLE: None Complications:  FHR decel during 2nd stage, with VE assist.  Diabetic consult with Diabetic Educator, for management of insulin.  Final plan for Lantus 20 u q hs, with Novalog sliding scale for meal coverage.  Specific scale listed in patient d/c instructions. [place] labs on admission WNL, with PCR abnormal at 0.54.  Magnesium deferred due to no severe features.  Pitocin induction for labor initiated, with epidural used for labor.  BP range during postpartum phase 128-148/70-99.  Dr. Alesia Richards consulted, no BP med needed.  [facility] RN to see patient within the week for BP check.  Will f/u at office in 2 weeks for recheck of BP and evaluation of CBGs.  Patient desired referral for Family Practice MD to manage diabetes.  Intrapartum Procedures: vacuum Postpartum Procedures: none Complications-Operative and Postpartum: None  Discharge Diagnoses: Preterm SROM, Type 2 DM, pre-eclampsia without severe features, FHR decel in 2nd stage, VE delivery  Feeding:  breast  Contraception:  Nexplanon  Hemoglobin Results:  CBC Latest Ref Rng 04/02/2015 04/01/2015 03/31/2015  WBC 4.0 - 10.5 K/uL 16.0(H) 18.8(H) 16.7(H)  Hemoglobin 12.0 - 15.0 g/dL 9.3(L) 10.2(L) 11.2(L)  Hematocrit 36.0 - 46.0 % 29.2(L) 31.3(L) 34.5(L)  Platelets 150 - 400 K/uL 243 248 297    Discharge Physical Exam:   General: alert Lochia: appropriate Uterine Fundus: firm  Incision: Intact DVT Evaluation: No evidence of DVT seen on physical exam. Negative Homan's sign.   Discharge Information:  Activity:           pelvic rest Diet:                routine Medications: Iron, Percocet and Lantus 20 u at bedtime, Novalog sliding scale Condition:      stable Instructions:  Routine pp instructions, pre-eclampsia  INSULIN REGIMEN UPON D/C: Novalog sliding scale: CBG < 70--treat hypoglycemia CBG  70-120--no correction CBG 121-150--1 unit CBG 151-200--2 units CBG 201-250--3 units CBG 251-300--5 units CBG 301-350--7 units CBG 351-400--9 units CBG > 400--Call [facility].  Lantus 20 u at bedtime.  New Rx for test strips sent to pharmacy.  Office will call to set up follow-up visit at [place] in 2 weeks for check of BP and review of blood sugar levels. Office will also work on referral to [place] at [facility] for [facility] MD referral.   Discharge to: home  Follow-up Information    Follow up with [facility], MD. Schedule an appointment as soon as possible for a visit in 2 weeks.   Specialty:  Obstetrics and Gynecology   Why:  Call for any questions or concerns.  Office will call you to schedule a f/u visit in 2 weeks for BP check and review of blood sugars.   Contact information:   [place] [id number] [phone]        [place], [place] 04/03/2015 10:39 AM

## 2015-04-03 NOTE — Progress Notes (Signed)
Novalog sliding scale: CBG < 70--treat hypoglycemia CBG 70-120--no correction CBG 121-150--1 unit CBG 151-200--2 units CBG 201-250--3 units CBG 251-300--5 units CBG 301-350--7 units CBG 351-400--9 units CBG > 400--Call [facility].  Lantus 20 u at bedtime.  Donnel Saxon, CNM 04/03/15 10:40a

## 2015-04-03 NOTE — Discharge Instructions (Signed)
INSULIN REGIMEN UPON D/C: Novalog sliding scale: CBG < 70--treat hypoglycemia CBG 70-120--no correction CBG 121-150--1 unit CBG 151-200--2 units CBG 201-250--3 units CBG 251-300--5 units CBG 301-350--7 units CBG 351-400--9 units CBG > 400--Call [facility].  Lantus 20 u at bedtime.  New Rx for test strips sent to your pharmacy.  Office will call you to set up follow-up visit at [place] in 2 weeks for check of BP and review of blood sugar levels. Office will also work on referral to [place] at [facility] for [facility] MD referral.     Postpartum Care After Vaginal Delivery After you deliver your newborn (postpartum period), the usual stay in the hospital is 24-72 hours. If there were problems with your labor or delivery, or if you have other medical problems, you might be in the hospital longer.  While you are in the hospital, you will receive help and instructions on how to care for yourself and your newborn during the postpartum period.  While you are in the hospital:  Be sure to tell your nurses if you have pain or discomfort, as well as where you feel the pain and what makes the pain worse.  If you had an incision made near your vagina (episiotomy) or if you had some tearing during delivery, the nurses may put ice packs on your episiotomy or tear. The ice packs may help to reduce the pain and swelling.  If you are breastfeeding, you may feel uncomfortable contractions of your uterus for a couple of weeks. This is normal. The contractions help your uterus get back to normal size.  It is normal to have some bleeding after delivery.  For the first 1-3 days after delivery, the flow is red and the amount may be similar to a period.  It is common for the flow to start and stop.  In the first few days, you may pass some small clots. Let your nurses know if you begin to pass large clots or your flow increases.  Do not  flush blood clots down the toilet before having the nurse look at  them.  During the next 3-10 days after delivery, your flow should become more watery and pink or brown-tinged in color.  Ten to fourteen days after delivery, your flow should be a small amount of yellowish-white discharge.  The amount of your flow will decrease over the first few weeks after delivery. Your flow may stop in 6-8 weeks. Most women have had their flow stop by 12 weeks after delivery.  You should change your sanitary pads frequently.  Wash your hands thoroughly with soap and water for at least 20 seconds after changing pads, using the toilet, or before holding or feeding your newborn.  You should feel like you need to empty your bladder within the first 6-8 hours after delivery.  In case you become weak, lightheaded, or faint, call your nurse before you get out of bed for the first time and before you take a shower for the first time.  Within the first few days after delivery, your breasts may begin to feel tender and full. This is called engorgement. Breast tenderness usually goes away within 48-72 hours after engorgement occurs. You may also notice milk leaking from your breasts. If you are not breastfeeding, do not stimulate your breasts. Breast stimulation can make your breasts produce more milk.  Spending as much time as possible with your newborn is very important. During this time, you and your newborn can feel close and  get to know each other. Having your newborn stay in your room (rooming in) will help to strengthen the bond with your newborn. It will give you time to get to know your newborn and become comfortable caring for your newborn.  Your hormones change after delivery. Sometimes the hormone changes can temporarily cause you to feel sad or tearful. These feelings should not last more than a few days. If these feelings last longer than that, you should talk to your caregiver.  If desired, talk to your caregiver about methods of family planning or  contraception.  Talk to your caregiver about immunizations. Your caregiver may want you to have the following immunizations before leaving the hospital:  Tetanus, diphtheria, and pertussis (Tdap) or tetanus and diphtheria (Td) immunization. It is very important that you and your family (including grandparents) or others caring for your newborn are up-to-date with the Tdap or Td immunizations. The Tdap or Td immunization can help protect your newborn from getting ill.  Rubella immunization.  Varicella (chickenpox) immunization.  Influenza immunization. You should receive this annual immunization if you did not receive the immunization during your pregnancy. Document Released: 08/13/2007 Document Revised: 07/10/2012 Document Reviewed: 06/12/2012 [facility] Patient Information 2015 [place], [place]. This information is not intended to replace advice given to you by your health care provider. Make sure you discuss any questions you have with your health care provider.  Preeclampsia and Eclampsia Preeclampsia is a serious condition that develops only during pregnancy. It is also called toxemia of pregnancy. This condition causes high blood pressure along with other symptoms, such as swelling and headaches. These may develop as the condition gets worse. Preeclampsia may occur 20 weeks or later into your pregnancy.  Diagnosing and treating preeclampsia early is very important. If not treated early, it can cause serious problems for you and your baby. One problem it can lead to is eclampsia, which is a condition that causes muscle jerking or shaking (convulsions) in the mother. Delivering your baby is the best treatment for preeclampsia or eclampsia.  RISK FACTORS The cause of preeclampsia is not known. You may be more likely to develop preeclampsia if you have certain risk factors. These include:   Being pregnant for the first time.  Having preeclampsia in a past pregnancy.  Having a family history of  preeclampsia.  Having high blood pressure.  Being pregnant with twins or triplets.  Being 39 or older.  Being African American.  Having kidney disease or diabetes.  Having medical conditions such as lupus or blood diseases.  Being very overweight (obese). SIGNS AND SYMPTOMS  The earliest signs of preeclampsia are:  High blood pressure.  Increased protein in your urine. Your health care provider will check for this at every prenatal visit. Other symptoms that can develop include:   Severe headaches.  Sudden weight gain.  Swelling of your hands, face, legs, and feet.  Feeling sick to your stomach (nauseous) and throwing up (vomiting).  Vision problems (blurred or double vision).  Numbness in your face, arms, legs, and feet.  Dizziness.  Slurred speech.  Sensitivity to bright lights.  Abdominal pain. DIAGNOSIS  There are no screening tests for preeclampsia. Your health care provider will ask you about symptoms and check for signs of preeclampsia during your prenatal visits. You may also have tests, including:  Urine testing.  Blood testing.  Checking your baby's heart rate.  Checking the health of your baby and your placenta using images created with sound waves (ultrasound). TREATMENT  You can work out the best treatment approach together with your health care provider. It is very important to keep all prenatal appointments. If you have an increased risk of preeclampsia, you may need more frequent prenatal exams.  Your health care provider may prescribe bed rest.  You may have to eat as little salt as possible.  You may need to take medicine to lower your blood pressure if the condition does not respond to more conservative measures.  You may need to stay in the hospital if your condition is severe. There, treatment will focus on controlling your blood pressure and fluid retention. You may also need to take medicine to prevent seizures.  If the condition  gets worse, your baby may need to be delivered early to protect you and the baby. You may have your labor started with medicine (be induced), or you may have a cesarean delivery.  Preeclampsia usually goes away after the baby is born. HOME CARE INSTRUCTIONS   Only take over-the-counter or prescription medicines as directed by your health care provider.  Lie on your left side while resting. This keeps pressure off your baby.  Elevate your feet while resting.  Get regular exercise. Ask your health care provider what type of exercise is safe for you.  Avoid caffeine and alcohol.  Do not smoke.  Drink 6-8 glasses of water every day.  Eat a balanced diet that is low in salt. Do not add salt to your food.  Avoid stressful situations as much as possible.  Get plenty of rest and sleep.  Keep all prenatal appointments and tests as scheduled. SEEK MEDICAL CARE IF:  You are gaining more weight than expected.  You have any headaches, abdominal pain, or nausea.  You are bruising more than usual.  You feel dizzy or light-headed. SEEK IMMEDIATE MEDICAL CARE IF:   You develop sudden or severe swelling anywhere in your body. This usually happens in the legs.  You gain 5 lb (2.3 kg) or more in a week.  You have a severe headache, dizziness, problems with your vision, or confusion.  You have severe abdominal pain.  You have lasting nausea or vomiting.  You have a seizure.  You have trouble moving any part of your body.  You develop numbness in your body.  You have trouble speaking.  You have any abnormal bleeding.  You develop a stiff neck.  You pass out. MAKE SURE YOU:   Understand these instructions.  Will watch your condition.  Will get help right away if you are not doing well or get worse. Document Released: 10/13/2000 Document Revised: 10/21/2013 Document Reviewed: 08/08/2013 [facility] Patient Information 2015 [place], [place]. This information is not intended to  replace advice given to you by your health care provider. Make sure you discuss any questions you have with your health care provider.

## 2015-04-14 ENCOUNTER — Inpatient Hospital Stay (HOSPITAL_COMMUNITY): Payer: BLUE CROSS/BLUE SHIELD

## 2015-04-15 ENCOUNTER — Inpatient Hospital Stay (HOSPITAL_COMMUNITY): Admission: RE | Admit: 2015-04-15 | Payer: BLUE CROSS/BLUE SHIELD | Source: Ambulatory Visit

## 2015-04-26 ENCOUNTER — Other Ambulatory Visit: Payer: Self-pay

## 2016-01-27 ENCOUNTER — Encounter (HOSPITAL_COMMUNITY): Payer: Self-pay | Admitting: *Deleted

## 2016-01-27 ENCOUNTER — Emergency Department (HOSPITAL_COMMUNITY): Payer: Medicaid Other

## 2016-01-27 ENCOUNTER — Inpatient Hospital Stay (HOSPITAL_COMMUNITY)
Admission: EM | Admit: 2016-01-27 | Discharge: 2016-02-02 | DRG: 291 | Disposition: A | Discharge status: Home / Self Care | Payer: Medicaid Other | Attending: Internal Medicine | Admitting: Internal Medicine

## 2016-01-27 DIAGNOSIS — G43909 Migraine, unspecified, not intractable, without status migrainosus: Secondary | ICD-10-CM | POA: Diagnosis present

## 2016-01-27 DIAGNOSIS — I10 Essential (primary) hypertension: Secondary | ICD-10-CM | POA: Insufficient documentation

## 2016-01-27 DIAGNOSIS — Z8669 Personal history of other diseases of the nervous system and sense organs: Secondary | ICD-10-CM

## 2016-01-27 DIAGNOSIS — Z79899 Other long term (current) drug therapy: Secondary | ICD-10-CM

## 2016-01-27 DIAGNOSIS — E1165 Type 2 diabetes mellitus with hyperglycemia: Secondary | ICD-10-CM | POA: Diagnosis present

## 2016-01-27 DIAGNOSIS — I110 Hypertensive heart disease with heart failure: Principal | ICD-10-CM | POA: Diagnosis present

## 2016-01-27 DIAGNOSIS — J189 Pneumonia, unspecified organism: Secondary | ICD-10-CM | POA: Diagnosis present

## 2016-01-27 DIAGNOSIS — E119 Type 2 diabetes mellitus without complications: Secondary | ICD-10-CM | POA: Diagnosis not present

## 2016-01-27 DIAGNOSIS — D696 Thrombocytopenia, unspecified: Secondary | ICD-10-CM | POA: Diagnosis not present

## 2016-01-27 DIAGNOSIS — B3324 Viral cardiomyopathy: Secondary | ICD-10-CM | POA: Diagnosis present

## 2016-01-27 DIAGNOSIS — I161 Hypertensive emergency: Secondary | ICD-10-CM

## 2016-01-27 DIAGNOSIS — R079 Chest pain, unspecified: Secondary | ICD-10-CM

## 2016-01-27 DIAGNOSIS — R000 Tachycardia, unspecified: Secondary | ICD-10-CM | POA: Diagnosis present

## 2016-01-27 DIAGNOSIS — I959 Hypotension, unspecified: Secondary | ICD-10-CM | POA: Diagnosis not present

## 2016-01-27 DIAGNOSIS — Z794 Long term (current) use of insulin: Secondary | ICD-10-CM

## 2016-01-27 DIAGNOSIS — Z6837 Body mass index (BMI) 37.0-37.9, adult: Secondary | ICD-10-CM

## 2016-01-27 DIAGNOSIS — Z789 Other specified health status: Secondary | ICD-10-CM | POA: Diagnosis not present

## 2016-01-27 DIAGNOSIS — I5023 Acute on chronic systolic (congestive) heart failure: Secondary | ICD-10-CM

## 2016-01-27 DIAGNOSIS — E876 Hypokalemia: Secondary | ICD-10-CM | POA: Diagnosis not present

## 2016-01-27 DIAGNOSIS — R9431 Abnormal electrocardiogram [ECG] [EKG]: Secondary | ICD-10-CM | POA: Diagnosis present

## 2016-01-27 DIAGNOSIS — R0600 Dyspnea, unspecified: Secondary | ICD-10-CM | POA: Diagnosis not present

## 2016-01-27 DIAGNOSIS — R0603 Acute respiratory distress: Secondary | ICD-10-CM | POA: Diagnosis present

## 2016-01-27 DIAGNOSIS — Z6836 Body mass index (BMI) 36.0-36.9, adult: Secondary | ICD-10-CM

## 2016-01-27 DIAGNOSIS — R0602 Shortness of breath: Secondary | ICD-10-CM

## 2016-01-27 LAB — I-STAT CG4 LACTIC ACID, ED: Lactic Acid, Venous: 1.03 mmol/L (ref 0.5–2.0)

## 2016-01-27 LAB — CBC WITH DIFFERENTIAL/PLATELET
Basophils Absolute: 0 10*3/uL (ref 0.0–0.1)
Basophils Relative: 0 %
EOS ABS: 0.1 10*3/uL (ref 0.0–0.7)
EOS PCT: 0 %
HCT: 38.4 % (ref 36.0–46.0)
Hemoglobin: 12.2 g/dL (ref 12.0–15.0)
LYMPHS ABS: 2.2 10*3/uL (ref 0.7–4.0)
Lymphocytes Relative: 14 %
MCH: 25.5 pg — ABNORMAL LOW (ref 26.0–34.0)
MCHC: 31.8 g/dL (ref 30.0–36.0)
MCV: 80.2 fL (ref 78.0–100.0)
MONO ABS: 0.9 10*3/uL (ref 0.1–1.0)
MONOS PCT: 6 %
Neutro Abs: 12.2 10*3/uL — ABNORMAL HIGH (ref 1.7–7.7)
Neutrophils Relative %: 80 %
PLATELETS: 385 10*3/uL (ref 150–400)
RBC: 4.79 MIL/uL (ref 3.87–5.11)
RDW: 13.1 % (ref 11.5–15.5)
WBC: 15.4 10*3/uL — AB (ref 4.0–10.5)

## 2016-01-27 LAB — STREP PNEUMONIAE URINARY ANTIGEN: Strep Pneumo Urinary Antigen: NEGATIVE

## 2016-01-27 LAB — PROCALCITONIN: Procalcitonin: <0.1 ng/mL

## 2016-01-27 LAB — CBG MONITORING, ED
GLUCOSE-CAPILLARY: 152 mg/dL — AB (ref 65–99)
Glucose-Capillary: 217 mg/dL — ABNORMAL HIGH (ref 65–99)

## 2016-01-27 LAB — INFLUENZA PANEL BY PCR (TYPE A & B)
H1N1 flu by pcr: NOT DETECTED
Influenza A By PCR: NEGATIVE
Influenza B By PCR: NEGATIVE

## 2016-01-27 LAB — I-STAT BETA HCG BLOOD, ED (MC, WL, AP ONLY): I-stat hCG, quantitative: <5 m[IU]/mL (ref <5)

## 2016-01-27 LAB — BASIC METABOLIC PANEL
Anion gap: 9 (ref 5–15)
BUN: 12 mg/dL (ref 6–20)
CALCIUM: 9.2 mg/dL (ref 8.9–10.3)
CO2: 20 mmol/L — ABNORMAL LOW (ref 22–32)
CREATININE: 0.67 mg/dL (ref 0.44–1.00)
Chloride: 111 mmol/L (ref 101–111)
GFR calc Af Amer: >60 mL/min (ref >60)
GLUCOSE: 161 mg/dL — AB (ref 65–99)
Potassium: 4.1 mmol/L (ref 3.5–5.1)
SODIUM: 140 mmol/L (ref 135–145)

## 2016-01-27 LAB — BRAIN NATRIURETIC PEPTIDE: B Natriuretic Peptide: 273.8 pg/mL — ABNORMAL HIGH (ref 0.0–100.0)

## 2016-01-27 LAB — GLUCOSE, CAPILLARY: Glucose-Capillary: 137 mg/dL — ABNORMAL HIGH (ref 65–99)

## 2016-01-27 LAB — I-STAT TROPONIN, ED: Troponin i, poc: 0.01 ng/mL (ref 0.00–0.08)

## 2016-01-27 MED ORDER — IOPAMIDOL (ISOVUE-370) INJECTION 76%
100.0000 mL | Freq: Once | INTRAVENOUS | Status: AC | PRN
  Administered 2016-01-27: 75 mL via INTRAVENOUS

## 2016-01-27 MED ORDER — INSULIN ASPART 100 UNIT/ML ~~LOC~~ SOLN
0.0000 [IU] | Freq: Three times a day (TID) | SUBCUTANEOUS | Status: DC
  Administered 2016-01-27: 3 [IU] via SUBCUTANEOUS
  Administered 2016-01-28: 2 [IU] via SUBCUTANEOUS
  Administered 2016-01-29: 1 [IU] via SUBCUTANEOUS
  Administered 2016-01-29: 2 [IU] via SUBCUTANEOUS
  Administered 2016-01-29 – 2016-01-30 (×2): 1 [IU] via SUBCUTANEOUS
  Administered 2016-01-30: 3 [IU] via SUBCUTANEOUS
  Administered 2016-01-31: 1 [IU] via SUBCUTANEOUS
  Administered 2016-01-31: 2 [IU] via SUBCUTANEOUS
  Administered 2016-01-31: 5 [IU] via SUBCUTANEOUS
  Administered 2016-02-01: 1 [IU] via SUBCUTANEOUS
  Administered 2016-02-01 – 2016-02-02 (×2): 2 [IU] via SUBCUTANEOUS

## 2016-01-27 MED ORDER — INSULIN GLARGINE 100 UNIT/ML ~~LOC~~ SOLN
36.0000 [IU] | Freq: Every day | SUBCUTANEOUS | Status: DC
  Administered 2016-01-27 – 2016-02-01 (×6): 36 [IU] via SUBCUTANEOUS
  Filled 2016-01-27 (×7): qty 0.36

## 2016-01-27 MED ORDER — HYDRALAZINE HCL 20 MG/ML IJ SOLN
10.0000 mg | Freq: Four times a day (QID) | INTRAMUSCULAR | Status: DC | PRN
Start: 2016-01-27 — End: 2016-01-27

## 2016-01-27 MED ORDER — ACETAMINOPHEN 500 MG PO TABS
1000.0000 mg | ORAL_TABLET | Freq: Once | ORAL | Status: AC
  Administered 2016-01-27: 1000 mg via ORAL
  Filled 2016-01-27: qty 2

## 2016-01-27 MED ORDER — LORATADINE 10 MG PO TABS
10.0000 mg | ORAL_TABLET | Freq: Every day | ORAL | Status: DC
  Administered 2016-01-27 – 2016-02-02 (×7): 10 mg via ORAL
  Filled 2016-01-27 (×7): qty 1

## 2016-01-27 MED ORDER — KETOROLAC TROMETHAMINE 15 MG/ML IJ SOLN
15.0000 mg | Freq: Once | INTRAMUSCULAR | Status: AC
  Administered 2016-01-27: 15 mg via INTRAVENOUS
  Filled 2016-01-27: qty 1

## 2016-01-27 MED ORDER — IPRATROPIUM-ALBUTEROL 0.5-2.5 (3) MG/3ML IN SOLN
3.0000 mL | Freq: Four times a day (QID) | RESPIRATORY_TRACT | Status: DC
  Administered 2016-01-27 – 2016-01-28 (×6): 3 mL via RESPIRATORY_TRACT
  Filled 2016-01-27 (×6): qty 3

## 2016-01-27 MED ORDER — INSULIN ASPART 100 UNIT/ML ~~LOC~~ SOLN
0.0000 [IU] | Freq: Every day | SUBCUTANEOUS | Status: DC
  Administered 2016-01-29 – 2016-01-31 (×2): 2 [IU] via SUBCUTANEOUS

## 2016-01-27 MED ORDER — HYDROCODONE-HOMATROPINE 5-1.5 MG/5ML PO SYRP
5.0000 mL | ORAL_SOLUTION | ORAL | Status: DC | PRN
  Administered 2016-01-27 – 2016-02-02 (×10): 5 mL via ORAL
  Filled 2016-01-27 (×10): qty 5

## 2016-01-27 MED ORDER — BUTALBITAL-APAP-CAFFEINE 50-325-40 MG PO TABS
1.0000 | ORAL_TABLET | Freq: Once | ORAL | Status: AC
  Administered 2016-01-27: 1 via ORAL
  Filled 2016-01-27: qty 1

## 2016-01-27 MED ORDER — BENZONATATE 100 MG PO CAPS
200.0000 mg | ORAL_CAPSULE | Freq: Three times a day (TID) | ORAL | Status: DC
  Administered 2016-01-27 – 2016-02-02 (×18): 200 mg via ORAL
  Filled 2016-01-27 (×19): qty 2

## 2016-01-27 MED ORDER — HYDRALAZINE HCL 20 MG/ML IJ SOLN
10.0000 mg | Freq: Four times a day (QID) | INTRAMUSCULAR | Status: DC | PRN
  Administered 2016-01-27 – 2016-01-30 (×4): 10 mg via INTRAVENOUS
  Filled 2016-01-27 (×4): qty 1

## 2016-01-27 MED ORDER — DEXTROSE 5 % IV SOLN
500.0000 mg | Freq: Once | INTRAVENOUS | Status: DC
  Filled 2016-01-27: qty 500

## 2016-01-27 MED ORDER — DEXTROSE 5 % IV SOLN
500.0000 mg | INTRAVENOUS | Status: DC
  Administered 2016-01-27 – 2016-01-29 (×3): 500 mg via INTRAVENOUS
  Filled 2016-01-27 (×2): qty 500

## 2016-01-27 MED ORDER — SODIUM CHLORIDE 0.9 % IV SOLN
INTRAVENOUS | Status: DC
  Administered 2016-01-27: 22:00:00 via INTRAVENOUS

## 2016-01-27 MED ORDER — IBUPROFEN 400 MG PO TABS
800.0000 mg | ORAL_TABLET | Freq: Once | ORAL | Status: AC
  Administered 2016-01-27: 800 mg via ORAL
  Filled 2016-01-27: qty 2

## 2016-01-27 MED ORDER — SODIUM CHLORIDE 0.9 % IV BOLUS (SEPSIS)
1000.0000 mL | Freq: Once | INTRAVENOUS | Status: AC
  Administered 2016-01-27: 1000 mL via INTRAVENOUS

## 2016-01-27 MED ORDER — DEXTROSE 5 % IV SOLN
1.0000 g | INTRAVENOUS | Status: DC
  Administered 2016-01-28 – 2016-02-01 (×5): 1 g via INTRAVENOUS
  Filled 2016-01-27 (×7): qty 10

## 2016-01-27 MED ORDER — ENOXAPARIN SODIUM 40 MG/0.4ML ~~LOC~~ SOLN
40.0000 mg | SUBCUTANEOUS | Status: DC
  Administered 2016-01-27 – 2016-02-01 (×6): 40 mg via SUBCUTANEOUS
  Filled 2016-01-27 (×6): qty 0.4

## 2016-01-27 MED ORDER — CEFTRIAXONE SODIUM 1 G IJ SOLR: 1.0000 g | INTRAMUSCULAR | Status: DC

## 2016-01-27 MED ORDER — ACETAMINOPHEN 325 MG PO TABS
650.0000 mg | ORAL_TABLET | Freq: Once | ORAL | Status: AC
  Administered 2016-01-27: 650 mg via ORAL
  Filled 2016-01-27: qty 2

## 2016-01-27 MED ORDER — DEXTROSE 5 % IV SOLN
1.0000 g | Freq: Once | INTRAVENOUS | Status: AC
  Administered 2016-01-27: 1 g via INTRAVENOUS
  Filled 2016-01-27: qty 10

## 2016-01-27 NOTE — ED Notes (Signed)
Pt arrives from [facility] via [facility]. UC had pt transferred to r/o PE. Pt states she has had a nonproductive cough for about 1-2 weeks and began having cp and sob last night. Pt has c/o of increased pain with inspiration and coughing. Pt has a hx of htn and is currently not on any medications rt breastfeeding 42m/o. Pt appears in NAD upon arrival.

## 2016-01-27 NOTE — ED Notes (Signed)
Attempted report at 87

## 2016-01-27 NOTE — ED Notes (Signed)
Pt ambulated in POD E hallway with steady gait. Oxygen saturation maintained between 98% - 100% on room air and HR was 133. Pt stated she felt mild SOB but "not as bad as before."

## 2016-01-27 NOTE — ED Provider Notes (Signed)
CSN: [id number]     Arrival date & time 01/27/16  1041 History   First MD Initiated Contact with Patient 01/27/16 1049     Chief Complaint  Patient presents with  . Chest Pain  . Cough     (Consider location/radiation/quality/duration/timing/severity/associated sxs/prior Treatment) HPI Comments: Patient presents today with complaints of chest pain, cough, and SOB.  She reports that the cough has been present for the past 1-2 weeks.  She states that last evening she felt like something was standing on her chest.  Chest pain has been constant since that time.  Chest pain is pleuritic.  She states that she also feels that it is difficult to take a deep breath.  She has also had an associated headache.  She has taken Tylenol and Ibuprofen, which she states has not helped with her headache or her chest pain.  She was seen at [facility] prior to arrival and sent to the ED to rule out a PE.  CXR done at [facility] showed possible infiltrate vs atelectasis.  She denies any fever, chills, hemoptysis, nausea, vomiting, numbness, tingling, or LE edema.  She denies any history of DVT or PE.  No prolonged travel or surgeries in the past 4 weeks.  She is currently on Nexplanon.  She denies any LE edema.  She does have a history of HTN and states that she was taken off of her antihypertensives due to the fact that she is breast feeding.  She gave birth approximately ten months ago.    The history is provided by the patient.    Past Medical History  Diagnosis Date  . Infected pilonidal cyst   . Anxiety     diagnosed in 2007  . Diabetes mellitus     type 2 - uncontrolled  . Hypertension     benign essential  . Migraine   . Non-reassuring fetal heart rate or rhythm affecting mother    Past Surgical History  Procedure Laterality Date  . No past surgeries     Family History  Problem Relation Age of Onset  . Diabetes Mother   . Diabetes Maternal Grandmother   . Hypertension Father   . Diabetes Paternal Grandmother    . Migraines Mother    Social History  Substance Use Topics  . Smoking status: Never Smoker   . Smokeless tobacco: Never Used  . Alcohol Use: No     Comment: drank 1-2 drinks a week before pregnancy   OB History    Gravida Para Term Preterm AB TAB SAB Ectopic Multiple Living   3 2  2 1  1   0 1     Review of Systems  All other systems reviewed and are negative.     Allergies  Septra ds; Lobster; and Latex  Home Medications   Prior to Admission medications   Medication Sig Start Date End Date Taking? Authorizing Provider  cetirizine (ZYRTEC) 10 MG tablet Take 10 mg by mouth daily.   Yes Historical Provider, MD  insulin glargine (LANTUS) 100 UNIT/ML injection Inject 0.2 mLs (20 Units total) into the skin at bedtime. Patient taking differently: Inject 36 Units into the skin at bedtime.  04/03/15  Yes Donnel Saxon, CNM  Insulin Pen Needle (1ST CHOICE PEN NEEDLES) 31G X 6 MM MISC Use with insulin pen 08/08/14   Harden Mo, MD   BP 150/109 mmHg  Pulse 116  Temp(Src) 98.3 F (36.8 C) (Oral)  Resp 20  SpO2 95% Physical Exam  Constitutional:  She appears well-developed and well-nourished.  HENT:  Head: Normocephalic and atraumatic.  Mouth/Throat: Oropharynx is clear and moist.  Neck: Normal range of motion. Neck supple.  Cardiovascular: Normal rate, regular rhythm and normal heart sounds.   Pulmonary/Chest: Effort normal and breath sounds normal. No accessory muscle usage. Tachypnea noted. No respiratory distress. She has no decreased breath sounds. She has no wheezes. She has no rhonchi. She has no rales. She exhibits no tenderness.  Abdominal: Soft. Bowel sounds are normal. She exhibits no distension and no mass. There is no tenderness. There is no rebound and no guarding.  Musculoskeletal: Normal range of motion.  No edema of LE bilaterally  Neurological: She is alert.  Skin: Skin is warm and dry.  Psychiatric: She has a normal mood and affect.  Nursing note and vitals  reviewed.   ED Course  Procedures (including critical care time) Labs Review Labs Reviewed - No data to display  Imaging Review No results found. I have personally reviewed and evaluated these images and lab results as part of my medical decision-making.   EKG Interpretation   Date/Time:  Thursday January 27 2016 10:54:13 EDT Ventricular Rate:  126 PR Interval:  142 QRS Duration: 90 QT Interval:  333 QTC Calculation: 482 R Axis:   114 Text Interpretation:  Sinus tachycardia Right axis deviation new Abnormal  Q suggests anterior infarct Borderline repolarization abnormality  Borderline prolonged QT interval Baseline wander in lead(s) II III aVR aVL  aVF V1 V2 V4 V5 Confirmed by Maryan Rued  MD, WHITNEY ([id number]) on 01/27/2016  11:10:01 AM      MDM   Final diagnoses:  None   Patient presents today with complaints of cough x 1-2 weeks, and CP since last evening.  She is also found to be tachycardic with a pulse ox around 95 on RA.  No wheezing.  She is afebrile.  CXR done at [facility] just prior to arrival showed infiltrate vs atelectasis.  She was then sent to the ED to rule out PE.  CT angio chest negative for PE, but did show possible bilateral Pneumonia.  She does report sick contacts.  She was then treated for CAP with Azithromycin and Rocephin IV.  Due to the fact that the patient is tachycardic with border line oxygen levels, diabetic, and with a bilateral pneumonia she was admitted to the hospital by [facility] for further management and monitoring.      Hyman Bible, PA-C 01/29/16 [place], MD 01/31/16 [id number]

## 2016-01-27 NOTE — H&P (Signed)
[facility] History and Physical                                                                                    Sara [name], is a 30 y.o. female  MRN: [id number]   DOB - 1986-09-18  Admit Date - 01/27/2016  Outpatient Primary MD for the patient is REDMON,NOELLE, PA-C  Referring MD: Maryan Rued / ER  PMH: Past Medical History  Diagnosis Date  . Infected pilonidal cyst   . Anxiety     diagnosed in 2007  . Diabetes mellitus     type 2 - uncontrolled  . Hypertension     benign essential  . Migraine   . Non-reassuring fetal heart rate or rhythm affecting mother       PSH: Past Surgical History  Procedure Laterality Date  . No past surgeries       CC:  Chief Complaint  Patient presents with  . Chest Pain  . Cough     HPI: 30 year old female patient with recent vaginal delivery June 2016 currently breast-feeding. That pregnancy was complicated by preeclampsia without any other sequelae. This patient also has a history of diabetes on Lantus and prior hypertension several years ago currently not on medication specifically for hypertension due to normalization of blood pressure although prior to the pregnancy patient was on an ACE inhibitor for nephro protection in setting of diabetes. Patient reports that about 2 weeks ago she began having a minimally productive with clear sputum cough that has been relentless and is now associated with some pleuritic chest discomfort. Several of her children have had upper respiratory infection symptoms but no one has been diagnosed with the flu. Over the past several days she's had increasing paroxysmal nocturnal dyspnea. She has not had any fevers or chills. She's not had any leg swelling, chest pain with exertion; she does have some mild dyspnea on exertion and increasing fatigue with activity. She is currently not taking her ACE inhibitor since she is continuing to breast-feed. No weight gain, no peripheral edema noted. She is set to  establish with an endocrinologist within the next 2 weeks with [facility] physicians.  ER Evaluation and treatment: Temperature 98.3-EP 157/124-pulse 123-respirations 25-room air saturations at rest 94% Repeat vital signs at 2 PM: BP 171/133-pulse 125-respirations 28-room air saturations at rest 97% EKG: Sinus tachycardia with ventricular rate 126 bpm, QTC 482 ms, borderline voltage criteria for LVH, no definitive ischemic changes CT angiogram of the chest with PE protocol:  NO pulmonary embolus; noted with widespread alveolar opacity bilaterally greater on the right than the left with peripheral sparing etiology could either be congestive heart failure, pneumonia or pulmonary hemorrhage Lab data: Na 140, K 4.1, BUN 12, Cr 0.67, glucose 161, troponin 0.01, lactic acid 1.03, WBCs 15,400 with neutrophils 80% and absolute neutrophils 12.2%, hemoglobin 12.2, platelets 385,000, i-STAT quantitative hCG less than 5 Zithromax and Rocephin have been ordered but have not yet been administered given patient desired to pump breast milk prior to administration of these medications Tylenol 650 mg by mouth 1  Review of Systems   In addition to the HPI above,  No Fever-chills, myalgias or other  constitutional symptoms No Headache, changes with Vision or hearing, new weakness, tingling, numbness in any extremity, No problems swallowing food or Liquids, indigestion/reflux No palpitations` No Abdominal pain, N/V; no melena or hematochezia, no dark tarry stools, No dysuria, hematuria or flank pain No new skin rashes, lesions, masses or bruises, No new joints pains-aches No recent weight gain or loss No polyuria, polydypsia or polyphagia,  *A full 10 point Review of Systems was done, except as stated above, all other Review of Systems were negative.  Social History Social History  Substance Use Topics  . Smoking status: Never Smoker   . Smokeless tobacco: Never Used  . Alcohol Use: No     Comment: drank 1-2  drinks a week before pregnancy    Resides at: Private residence  Lives with: Spouse  Ambulatory status: Without assistive devices  Occupational history: Works as a Therapist, sports   Family History Family History  Problem Relation Age of Onset  . Diabetes Mother   . Diabetes Maternal Grandmother   . Hypertension Father   . Diabetes Paternal Grandmother   . Migraines Mother      Prior to Admission medications   Medication Sig Start Date End Date Taking? Authorizing Provider  cetirizine (ZYRTEC) 10 MG tablet Take 10 mg by mouth daily.   Yes Historical Provider, MD  insulin glargine (LANTUS) 100 UNIT/ML injection Inject 0.2 mLs (20 Units total) into the skin at bedtime. Patient taking differently: Inject 36 Units into the skin at bedtime.  04/03/15  Yes Donnel Saxon, CNM  Insulin Pen Needle (1ST CHOICE PEN NEEDLES) 31G X 6 MM MISC Use with insulin pen 08/08/14   Harden Mo, MD    Allergies  Allergen Reactions  . Septra Ds [Sulfamethoxazole-Trimethoprim] Hives and Itching  . Lobster [Shellfish Allergy] Itching    Throat itching  . Latex Rash    Physical Exam  Vitals  Blood pressure 171/133, pulse 125, temperature 98.3 F (36.8 C), temperature source Oral, resp. rate 28, SpO2 97 %, currently breastfeeding.   General:  In no acute distress, appears healthy and well nourished  Psych:  Normal affect, Denies Suicidal or Homicidal ideations, Awake Alert, Oriented X 3. Speech and thought patterns are clear and appropriate, no apparent short term memory deficits  Neuro:   No focal neurological deficits, CN II through XII intact, Strength 5/5 all 4 extremities, Sensation intact all 4 extremities.  ENT:  Ears and Eyes appear Normal, Conjunctivae clear, PER. Moist oral mucosa without erythema or exudates.  Neck:  Supple, No lymphadenopathy appreciated  Respiratory:  Symmetrical chest wall movement, Good air movement bilaterallyAlthough slightly diminished right  side, coarse to auscultation without definitive crackles rhonchi or wheezes. Room Air with appropriate saturations at rest  Cardiac:  RRR, No Murmurs, no LE edema noted, no JVD, No carotid bruits, peripheral pulses palpable at 2+  Abdomen:  Positive bowel sounds, Soft, Non tender, Non distended,  No masses appreciated, no obvious hepatosplenomegaly  Skin:  No Cyanosis, Normal Skin Turgor, No Skin Rash or Bruise.  Extremities: Symmetrical without obvious trauma or injury,  no effusions.  Data Review  CBC  Recent Labs Lab 01/27/16 1116  WBC 15.4*  HGB 12.2  HCT 38.4  PLT 385  MCV 80.2  MCH 25.5*  MCHC 31.8  RDW 13.1  LYMPHSABS 2.2  MONOABS 0.9  EOSABS 0.1  BASOSABS 0.0    Chemistries   Recent Labs Lab 01/27/16 1116  NA 140  K 4.1  CL 111  CO2 20*  GLUCOSE 161*  BUN 12  CREATININE 0.67  CALCIUM 9.2    CrCl cannot be calculated (Unknown ideal weight.).  No results for input(s): TSH, T4TOTAL, T3FREE, THYROIDAB in the last 72 hours.  Invalid input(s): FREET3  Coagulation profile No results for input(s): INR, PROTIME in the last 168 hours.  No results for input(s): DDIMER in the last 72 hours.  Cardiac Enzymes No results for input(s): CKMB, TROPONINI, MYOGLOBIN in the last 168 hours.  Invalid input(s): CK  Invalid input(s): POCBNP  Urinalysis    Component Value Date/Time   COLORURINE YELLOW 03/24/2015 [place] 03/24/2015 2215   LABSPEC 1.010 03/24/2015 2215   PHURINE 6.5 03/24/2015 2215   GLUCOSEU NEGATIVE 03/24/2015 2215   HGBUR NEGATIVE 03/24/2015 2215   BILIRUBINUR NEGATIVE 03/24/2015 2215   KETONESUR NEGATIVE 03/24/2015 2215   PROTEINUR NEGATIVE 03/24/2015 2215   UROBILINOGEN 0.2 03/24/2015 2215   NITRITE NEGATIVE 03/24/2015 2215   LEUKOCYTESUR NEGATIVE 03/24/2015 2215    Imaging results:   Ct Angio Chest Pe W/cm &/or Wo Cm  01/27/2016  CLINICAL DATA:  Shortness of breath and chest pain for 1 day EXAM: CT ANGIOGRAPHY  CHEST WITH CONTRAST TECHNIQUE: Multidetector CT imaging of the chest was performed using the standard protocol during bolus administration of intravenous contrast. Multiplanar CT image reconstructions and MIPs were obtained to evaluate the vascular anatomy. CONTRAST:  75 mL Isovue 370 nonionic COMPARISON:  Chest radiograph November 06, 2006 FINDINGS: Mediastinum/Lymph Nodes: There is no demonstrable pulmonary embolus. There is no thoracic aortic aneurysm or dissection. The visualized great vessels appear normal. The heart is mildly enlarged. There is no appreciable pericardial effusion. No thyroid lesions are evident. There is no appreciable thoracic adenopathy. Lungs/Pleura: There is diffuse airspace consolidation throughout the lungs bilaterally, somewhat more severe on the right than left. There is relative peripheral sparing bilaterally. Airspace opacity is seen throughout most lobes in segments to varying degrees with the least amount of airspace opacity in the left apex region. There are no appreciable pleural effusions. Upper abdomen: There is reflux of contrast into the inferior vena cava and hepatic veins. Visualized upper abdominal structures are otherwise normal. Musculoskeletal:  There are no blastic or lytic bone lesions. Review of the MIP images confirms the above findings. IMPRESSION: No demonstrable pulmonary embolus. Widespread alveolar opacity bilaterally, somewhat more severe on the right than on the left. Relative peripheral sparing is noted bilaterally. The appearance is consistent with widespread alveolar edema, either due to congestive heart failure or noncardiogenic etiology. Given pregnancy and history of hypertension, preeclampsia as a cause of widespread edema must be of concern. Differential considerations must include widespread infectious pneumonia, diffuse aspiration, or pulmonary hemorrhage. Reflux of contrast into the inferior vena cava and hepatic veins may be indicative of a degree  of increased right heart pressure. No adenopathy evident. Electronically Signed   By: Lowella Grip III M.D.   On: 01/27/2016 13:05     EKG: (Independently reviewed)  Sinus tachycardia with ventricular rate 126 bpm, QTC 482 ms, borderline voltage criteria for LVH, no definitive ischemic changes   Assessment & Plan  Principal Problem:   CAP / Acute respiratory distress  -History appears to be more consistent with infectious process i.e. viral upper respiratory syndrome with likely secondary bacterial infection -Admit to floor/Obs (patient is hopeful can discharge within the next 48 hours second return home to him that and continue breast-feeding) -Continue supportive care including oxygen if needed, begin [employer], begin  antitussive agent with cough suppressant -Check influenza PCR -Rocephin and Zithromax IV-likely will need to return to oral agent that can be administered in breast-feeding patient -Gentle IV fluids given suspected insensible fluid losses with upper respiratory symptoms but watch carefully for potential volume overload (see below) -Check room air ambulatory oximetry to see if patient desaturating with mobilization  Active Problems:   Hypertensive emergency/  Abnormal EKG -Patient reports several years ago had been taking antihypertensive medication but with normalization of blood pressure medicine had been stopped; prior to most recent pregnancy patient had been taking an ACE inhibitor for nephro protection but has not taken secondary to pregnancy and breast-feeding -Currently has moderately elevated systolic blood pressure significantly elevated diastolic blood pressures with readings consistently greater than 110 -For now provide hydralazine prn; may need to add oral agent Remus admission -On chest x-ray patient has cardiomegaly and has voltage criteria are concerning for LVH on EKG so check echocardiogram  -CT questioning CHF but patient does not have referral  edema or other signs of volume overload; check BNP     Diabetes mellitus with insulin therapy  -Continue preadmission Lantus -Check hemoglobin A1c -Follow CBGs and provide SSI    Breastfeeding  -We are starting antibiotics in this patient including Zithromax therefore will have patient pump and discard breast milk during hospitalization -Recommend if any questions regarding appropriate medications during breast-feeding to call pharmacy at [facility] for better clarification    History of migraine headaches - with aura -Patient complaining of persistent headache for several days without fever or nuchal signs -We'll treat as typical migraine (although could be related to uncontrolled hypertension as well) and will give one-time IV Toradol with 1 time Fiorecet    BMI 36.0-36.9,adult    DVT Prophylaxis: Lovenox  Family Communication:   Husband at bedside  Code Status:  Full code  Condition:  Stable  Discharge disposition: Anticipate discharge back to home environment once medically stable hopefully within the next 48 hours  Time spent in minutes : 60      ELLIS,ALLISON L. ANP on 01/27/2016 at 3:13 PM  You may contact me by going to [email or web address] - password [email or web address]  I am available from 7a-7p but please confirm I am on the schedule by going to Amion as above.   After 7p please contact night coverage person covering me after hours  [facility]

## 2016-01-27 NOTE — ED Notes (Signed)
Admitting requests that pt is allowed to pump more breastmilk prior to receiving any more medications. After pumping ambulate with oxygen saturation monitoring then begin meds.

## 2016-01-27 NOTE — ED Notes (Signed)
Patient transported to CT 

## 2016-01-28 ENCOUNTER — Observation Stay (HOSPITAL_BASED_OUTPATIENT_CLINIC_OR_DEPARTMENT_OTHER): Payer: Medicaid Other

## 2016-01-28 DIAGNOSIS — Z79899 Other long term (current) drug therapy: Secondary | ICD-10-CM | POA: Diagnosis not present

## 2016-01-28 DIAGNOSIS — Z6837 Body mass index (BMI) 37.0-37.9, adult: Secondary | ICD-10-CM | POA: Diagnosis not present

## 2016-01-28 DIAGNOSIS — G43909 Migraine, unspecified, not intractable, without status migrainosus: Secondary | ICD-10-CM | POA: Diagnosis present

## 2016-01-28 DIAGNOSIS — D696 Thrombocytopenia, unspecified: Secondary | ICD-10-CM | POA: Diagnosis not present

## 2016-01-28 DIAGNOSIS — Z6836 Body mass index (BMI) 36.0-36.9, adult: Secondary | ICD-10-CM

## 2016-01-28 DIAGNOSIS — I5023 Acute on chronic systolic (congestive) heart failure: Secondary | ICD-10-CM | POA: Diagnosis present

## 2016-01-28 DIAGNOSIS — I110 Hypertensive heart disease with heart failure: Secondary | ICD-10-CM | POA: Diagnosis not present

## 2016-01-28 DIAGNOSIS — I959 Hypotension, unspecified: Secondary | ICD-10-CM | POA: Diagnosis not present

## 2016-01-28 DIAGNOSIS — R0600 Dyspnea, unspecified: Secondary | ICD-10-CM | POA: Diagnosis not present

## 2016-01-28 DIAGNOSIS — I5021 Acute systolic (congestive) heart failure: Secondary | ICD-10-CM | POA: Diagnosis not present

## 2016-01-28 DIAGNOSIS — R9431 Abnormal electrocardiogram [ECG] [EKG]: Secondary | ICD-10-CM | POA: Diagnosis not present

## 2016-01-28 DIAGNOSIS — Z789 Other specified health status: Secondary | ICD-10-CM | POA: Diagnosis not present

## 2016-01-28 DIAGNOSIS — Z794 Long term (current) use of insulin: Secondary | ICD-10-CM | POA: Diagnosis not present

## 2016-01-28 DIAGNOSIS — R7989 Other specified abnormal findings of blood chemistry: Secondary | ICD-10-CM

## 2016-01-28 DIAGNOSIS — R000 Tachycardia, unspecified: Secondary | ICD-10-CM | POA: Diagnosis present

## 2016-01-28 DIAGNOSIS — J189 Pneumonia, unspecified organism: Secondary | ICD-10-CM | POA: Diagnosis present

## 2016-01-28 DIAGNOSIS — R079 Chest pain, unspecified: Secondary | ICD-10-CM | POA: Diagnosis not present

## 2016-01-28 DIAGNOSIS — B3324 Viral cardiomyopathy: Secondary | ICD-10-CM | POA: Diagnosis present

## 2016-01-28 DIAGNOSIS — E1165 Type 2 diabetes mellitus with hyperglycemia: Secondary | ICD-10-CM | POA: Diagnosis present

## 2016-01-28 DIAGNOSIS — E876 Hypokalemia: Secondary | ICD-10-CM | POA: Diagnosis not present

## 2016-01-28 DIAGNOSIS — I161 Hypertensive emergency: Secondary | ICD-10-CM | POA: Diagnosis present

## 2016-01-28 DIAGNOSIS — I10 Essential (primary) hypertension: Secondary | ICD-10-CM | POA: Diagnosis not present

## 2016-01-28 LAB — CBC
HEMATOCRIT: 36 % (ref 36.0–46.0)
HEMOGLOBIN: 11.8 g/dL — AB (ref 12.0–15.0)
MCH: 26.5 pg (ref 26.0–34.0)
MCHC: 32.8 g/dL (ref 30.0–36.0)
MCV: 80.9 fL (ref 78.0–100.0)
Platelets: 352 10*3/uL (ref 150–400)
RBC: 4.45 MIL/uL (ref 3.87–5.11)
RDW: 13.6 % (ref 11.5–15.5)
WBC: 16.3 10*3/uL — AB (ref 4.0–10.5)

## 2016-01-28 LAB — BASIC METABOLIC PANEL
ANION GAP: 11 (ref 5–15)
BUN: 9 mg/dL (ref 6–20)
CHLORIDE: 110 mmol/L (ref 101–111)
CO2: 20 mmol/L — AB (ref 22–32)
CREATININE: 0.73 mg/dL (ref 0.44–1.00)
Calcium: 9.1 mg/dL (ref 8.9–10.3)
GFR calc non Af Amer: >60 mL/min (ref >60)
Glucose, Bld: 87 mg/dL (ref 65–99)
Potassium: 3.7 mmol/L (ref 3.5–5.1)
SODIUM: 141 mmol/L (ref 135–145)

## 2016-01-28 LAB — GLUCOSE, CAPILLARY
GLUCOSE-CAPILLARY: 141 mg/dL — AB (ref 65–99)
Glucose-Capillary: 79 mg/dL (ref 65–99)
Glucose-Capillary: 89 mg/dL (ref 65–99)
Glucose-Capillary: 181 mg/dL — ABNORMAL HIGH (ref 65–99)

## 2016-01-28 LAB — HIV ANTIBODY (ROUTINE TESTING W REFLEX): HIV SCREEN 4TH GENERATION: NONREACTIVE

## 2016-01-28 LAB — ECHOCARDIOGRAM COMPLETE
HEIGHTINCHES: 62 in
WEIGHTICAEL: 3245.17 [oz_av]

## 2016-01-28 LAB — HEMOGLOBIN A1C
Hgb A1c MFr Bld: 8.6 % — ABNORMAL HIGH (ref 4.8–5.6)
Mean Plasma Glucose: 200 mg/dL

## 2016-01-28 MED ORDER — LABETALOL HCL 100 MG PO TABS
100.0000 mg | ORAL_TABLET | Freq: Two times a day (BID) | ORAL | Status: DC
  Administered 2016-01-28 – 2016-01-29 (×2): 100 mg via ORAL
  Filled 2016-01-28 (×2): qty 1

## 2016-01-28 MED ORDER — FUROSEMIDE 10 MG/ML IJ SOLN
20.0000 mg | Freq: Every day | INTRAMUSCULAR | Status: DC
  Administered 2016-01-28 – 2016-01-30 (×3): 20 mg via INTRAVENOUS
  Filled 2016-01-28 (×3): qty 2

## 2016-01-28 MED ORDER — HYDROMORPHONE HCL 1 MG/ML IJ SOLN
1.0000 mg | INTRAMUSCULAR | Status: DC | PRN
  Administered 2016-01-28 – 2016-01-30 (×11): 1 mg via INTRAVENOUS
  Filled 2016-01-28 (×11): qty 1

## 2016-01-28 MED ORDER — KETOROLAC TROMETHAMINE 30 MG/ML IJ SOLN
30.0000 mg | Freq: Once | INTRAMUSCULAR | Status: AC
  Administered 2016-01-28: 30 mg via INTRAVENOUS
  Filled 2016-01-28: qty 1

## 2016-01-28 MED ORDER — HYDRALAZINE HCL 10 MG PO TABS
10.0000 mg | ORAL_TABLET | Freq: Three times a day (TID) | ORAL | Status: DC
  Administered 2016-01-28: 10 mg via ORAL
  Filled 2016-01-28: qty 1

## 2016-01-28 NOTE — Progress Notes (Addendum)
Patient ID: Sara [name], female   DOB: 1985-12-30, 30 y.o.   MRN: [id number]  [facility] PROGRESS NOTE  Sara [name] [id number] DOB: 1986/04/13 DOA: 01/27/2016 PCP: Lennie Odor, PA-C   Brief narrative:    30 year old female patient with recent vaginal delivery June 2016 currently breast-feeding, pregnancy was complicated by preeclampsia, diabetes on Lantus and prior hypertension several years ago currently not on medication. Over the past several days pt had increasing paroxysmal nocturnal dyspnea, no fevers or chills, no similar events in the past.   In ED, CT angiogram of the chest with PE protocol: with no pulmonary embolus; noted with widespread alveolar opacity bilaterally R > L, ? congestive heart failure, pneumonia or pulmonary hemorrhage  Assessment/Plan:    Principal Problem:   CAP (community acquired pneumonia) - continue with Rocephin and Zithromax day #2 - currently no need for oxygen via [place] - monitor clinical response - provide antitussives as needed   Active Problems:   Diabetes mellitus with insulin therapy ([place]) - continue home regimen with insulin  - also on SSI     History of migraine headaches - with aura - provide analgesia as needed     BMI 36.0-36.9,adult - Body mass index is 37.09 kg/(m^2)    Hypertensive emergency / tachycardia  - SBP still in 180's  - placed on hydralazine as needed  - also added labetalol and lasix  - follow up     Breastfeeding (infant)  DVT prophylaxis - Lovenox SQ  Code Status: Full.  Family Communication:  plan of care discussed with the patient Disposition Plan: Home by 4/3   IV access:  Peripheral IV  Procedures and diagnostic studies:    Ct Angio Chest Pe W/cm &/or Wo Cm 01/27/2016  No demonstrable pulmonary embolus. Widespread alveolar opacity bilaterally, somewhat more severe on the right than on the left. Relative peripheral sparing is noted bilaterally. The appearance is consistent with  widespread alveolar edema, either due to congestive heart failure or noncardiogenic etiology. Given pregnancy and history of hypertension, preeclampsia as a cause of widespread edema must be of concern. Differential considerations must include widespread infectious pneumonia, diffuse aspiration, or pulmonary hemorrhage. Reflux of contrast into the inferior vena cava and hepatic veins may be indicative of a degree of increased right heart pressure. No adenopathy evident.  Medical Consultants:  None  Other Consultants:  None  IAnti-Infectives:   Zithromax 3/30 --> Rocephin 3/30 -->   Faye Ramsay, MD  [facility] Pager [phone]  If 7PM-7AM, please contact night-coverage [email or web address] Password [facility] 01/28/2016, 3:49 PM  HPI/Subjective: No events overnight.   Objective: Filed Vitals:   01/28/16 0753 01/28/16 1041 01/28/16 1327 01/28/16 1511  BP: 173/115  180/120   Pulse:   132   Temp:   98.5 F (36.9 C)   TempSrc:   Oral   Resp:   18   Height:      Weight:      SpO2:  96% 93% 96%    Intake/Output Summary (Last 24 hours) at 01/28/16 1549 Last data filed at 01/28/16 1000  Gross per 24 hour  Intake 1302.5 ml  Output      0 ml  Net 1302.5 ml    Exam:   General:  Pt is alert, follows commands appropriately, not in acute distress  Cardiovascular: Regular rhythm, tachycardic, no rubs, no gallops  Respiratory: bibasilar rhonchi noted but no wheezing   Abdomen: Soft, non tender, non distended, bowel sounds present, no guarding  Extremities: pulses DP  and PT palpable bilaterally  Neuro: Grossly nonfocal  Data Reviewed: Basic Metabolic Panel:  Recent Labs Lab 01/27/16 1116 01/28/16 0601  NA 140 141  K 4.1 3.7  CL 111 110  CO2 20* 20*  GLUCOSE 161* 87  BUN 12 9  CREATININE 0.67 0.73  CALCIUM 9.2 9.1   CBC:  Recent Labs Lab 01/27/16 1116 01/28/16 0601  WBC 15.4* 16.3*  NEUTROABS 12.2*  --   HGB 12.2 11.8*  HCT 38.4 36.0  MCV 80.2 80.9  PLT 385 352    CBG:  Recent Labs Lab 01/27/16 1129 01/27/16 1719 01/27/16 2218 01/28/16 0752 01/28/16 1154  GLUCAP 152* 217* 137* 79 89    Recent Results (from the past 240 hour(s))  Culture, blood (routine x 2)     Status: None (Preliminary result)   Collection Time: 01/27/16  2:32 PM  Result Value Ref Range Status   Specimen Description BLOOD RIGHT ANTECUBITAL  Final   Special Requests BOTTLES DRAWN AEROBIC AND ANAEROBIC 5CC  Final   Culture NO GROWTH < 24 HOURS  Final   Report Status PENDING  Incomplete  Culture, blood (routine x 2)     Status: None (Preliminary result)   Collection Time: 01/27/16  2:35 PM  Result Value Ref Range Status   Specimen Description BLOOD RIGHT HAND  Final   Special Requests BOTTLES DRAWN AEROBIC ONLY 5CC  Final   Culture NO GROWTH < 24 HOURS  Final   Report Status PENDING  Incomplete    Scheduled Meds: . azithromycin  500 mg Intravenous Q24H  . benzonatate  200 mg Oral TID  . cefTRIAXone (ROCEPHIN)  IV  1 g Intravenous Q24H  . enoxaparin (LOVENOX) injection  40 mg Subcutaneous Q24H  . insulin aspart  0-5 Units Subcutaneous QHS  . insulin aspart  0-9 Units Subcutaneous TID WC  . insulin glargine  36 Units Subcutaneous QHS  . ipratropium-albuterol  3 mL Nebulization Q6H  . loratadine  10 mg Oral Daily   Continuous Infusions: . sodium chloride 50 mL/hr at 01/27/16 2153

## 2016-01-28 NOTE — Progress Notes (Signed)
  Echocardiogram 2D Echocardiogram has been performed.  Sara [name] 01/28/2016, 12:25 PM

## 2016-01-29 DIAGNOSIS — R0600 Dyspnea, unspecified: Secondary | ICD-10-CM

## 2016-01-29 DIAGNOSIS — I5023 Acute on chronic systolic (congestive) heart failure: Secondary | ICD-10-CM

## 2016-01-29 DIAGNOSIS — R9431 Abnormal electrocardiogram [ECG] [EKG]: Secondary | ICD-10-CM

## 2016-01-29 DIAGNOSIS — I10 Essential (primary) hypertension: Secondary | ICD-10-CM

## 2016-01-29 DIAGNOSIS — J189 Pneumonia, unspecified organism: Secondary | ICD-10-CM

## 2016-01-29 LAB — BASIC METABOLIC PANEL
ANION GAP: 12 (ref 5–15)
BUN: 14 mg/dL (ref 6–20)
CO2: 20 mmol/L — ABNORMAL LOW (ref 22–32)
Calcium: 9.4 mg/dL (ref 8.9–10.3)
Chloride: 106 mmol/L (ref 101–111)
Creatinine, Ser: 0.83 mg/dL (ref 0.44–1.00)
Glucose, Bld: 127 mg/dL — ABNORMAL HIGH (ref 65–99)
POTASSIUM: 4.1 mmol/L (ref 3.5–5.1)
SODIUM: 138 mmol/L (ref 135–145)

## 2016-01-29 LAB — PROCALCITONIN

## 2016-01-29 LAB — CBC
HCT: 37.6 % (ref 36.0–46.0)
Hemoglobin: 11.9 g/dL — ABNORMAL LOW (ref 12.0–15.0)
MCH: 25.4 pg — ABNORMAL LOW (ref 26.0–34.0)
MCHC: 31.6 g/dL (ref 30.0–36.0)
MCV: 80.2 fL (ref 78.0–100.0)
PLATELETS: 417 10*3/uL — AB (ref 150–400)
RBC: 4.69 MIL/uL (ref 3.87–5.11)
RDW: 13.7 % (ref 11.5–15.5)
WBC: 16.8 10*3/uL — AB (ref 4.0–10.5)

## 2016-01-29 LAB — GLUCOSE, CAPILLARY
GLUCOSE-CAPILLARY: 139 mg/dL — AB (ref 65–99)
GLUCOSE-CAPILLARY: 145 mg/dL — AB (ref 65–99)
Glucose-Capillary: 160 mg/dL — ABNORMAL HIGH (ref 65–99)

## 2016-01-29 MED ORDER — IPRATROPIUM-ALBUTEROL 0.5-2.5 (3) MG/3ML IN SOLN: 3.0000 mL | RESPIRATORY_TRACT | Status: DC | PRN

## 2016-01-29 MED ORDER — CARVEDILOL 6.25 MG PO TABS
6.2500 mg | ORAL_TABLET | Freq: Two times a day (BID) | ORAL | Status: DC
  Administered 2016-01-29 – 2016-01-30 (×2): 6.25 mg via ORAL
  Filled 2016-01-29 (×2): qty 1

## 2016-01-29 MED ORDER — SACUBITRIL-VALSARTAN 24-26 MG PO TABS
1.0000 | ORAL_TABLET | Freq: Two times a day (BID) | ORAL | Status: DC
  Administered 2016-01-29 – 2016-02-01 (×7): 1 via ORAL
  Filled 2016-01-29 (×11): qty 1

## 2016-01-29 MED ORDER — DIPHENHYDRAMINE HCL 25 MG PO CAPS
25.0000 mg | ORAL_CAPSULE | Freq: Four times a day (QID) | ORAL | Status: DC | PRN
  Administered 2016-01-29 – 2016-01-30 (×2): 25 mg via ORAL
  Filled 2016-01-29 (×2): qty 1

## 2016-01-29 MED ORDER — FUROSEMIDE 10 MG/ML IJ SOLN
40.0000 mg | Freq: Once | INTRAMUSCULAR | Status: AC
  Administered 2016-01-29: 40 mg via INTRAVENOUS
  Filled 2016-01-29: qty 4

## 2016-01-29 NOTE — Progress Notes (Addendum)
Primary Physician: Primary Cardiologist:  Armen Pickup   Asked to see re CHF   HPI:  Pt is a 30 yo with hsitory of DM, HTN  She is 9 months post partum  Delivered second baby by NSVD in June [id number]  Complicated by preeclampsia.  Post delivery she says she did ok  BP came down       Present to [place] on 3/30 with PND   Rx initally with ABX IV   Echo yesterday LV mildly dliated  LVEF was 35%  RVEF normal  Note pt was tachycardic during exam    Pt had preeclampsic with first pregancy Reports being treated with lisinopril for awhile after first baby was born  but this was stopped as BP was OK  Had preeclampsia at this 2nd delivery  Was not on antihy[pertensives after    The pt deneis CP  Breathing is better than on admit        Past Medical History  Diagnosis Date  . Infected pilonidal cyst   . Anxiety     diagnosed in 2007  . Diabetes mellitus     type 2 - uncontrolled  . Hypertension     benign essential  . Migraine   . Non-reassuring fetal heart rate or rhythm affecting mother     Medications Prior to Admission  Medication Sig Dispense Refill  . cetirizine (ZYRTEC) 10 MG tablet Take 10 mg by mouth daily.    . insulin glargine (LANTUS) 100 UNIT/ML injection Inject 0.2 mLs (20 Units total) into the skin at bedtime. (Patient taking differently: Inject 36 Units into the skin at bedtime. ) 10 mL 11  . Insulin Pen Needle (1ST CHOICE PEN NEEDLES) 31G X 6 MM MISC Use with insulin pen 100 each 0     . azithromycin  500 mg Intravenous Q24H  . benzonatate  200 mg Oral TID  . cefTRIAXone (ROCEPHIN)  IV  1 g Intravenous Q24H  . enoxaparin (LOVENOX) injection  40 mg Subcutaneous Q24H  . furosemide  20 mg Intravenous Daily  . insulin aspart  0-5 Units Subcutaneous QHS  . insulin aspart  0-9 Units Subcutaneous TID WC  . insulin glargine  36 Units Subcutaneous QHS  . labetalol  100 mg Oral BID  . loratadine  10 mg Oral Daily    Infusions:    Allergies  Allergen Reactions    . Septra Ds [Sulfamethoxazole-Trimethoprim] Hives and Itching  . Lobster [Shellfish Allergy] Itching    Throat itching  . Latex Rash    Social History   Social History  . Marital Status: Divorced    Spouse Name: N/A  . Number of Children: 1  . Years of Education: 12   Occupational History  . Not on file.   Social History Main Topics  . Smoking status: Never Smoker   . Smokeless tobacco: Never Used  . Alcohol Use: No     Comment: drank 1-2 drinks a week before pregnancy  . Drug Use: No  . Sexual Activity: Yes   Other Topics Concern  . Not on file   Social History Narrative   Lives with boyfriend and child   Is pregnant (20weeks) with second child   1st born is a boy and her second is a boy   Drinks one cup of coffee a day    Family History  Problem Relation Age of Onset  . Diabetes Mother   . Diabetes Maternal Grandmother   .  Hypertension Father   . Diabetes Paternal Grandmother   . Migraines Mother     No Fhx of CAD or CHF    REVIEW OF SYSTEMS:  All systems reviewed  Negative to the above problem except as noted above.    PHYSICAL EXAM: Filed Vitals:   01/29/16 0524 01/29/16 0627  BP: 170/122 164/109  Pulse: 125   Temp: 98.6 F (37 C)   Resp: 18      Intake/Output Summary (Last 24 hours) at 01/29/16 1036 Last data filed at 01/29/16 1033  Gross per 24 hour  Intake 1843.33 ml  Output      0 ml  Net 1843.33 ml    General:  Well appearing. No respiratory difficulty HEENT: normal Neck: supple. JVP is increased   Carotids 2+ bilat; no bruits. No lymphadenopathy or thryomegaly appreciated. Cor: PMI nondisplaced. Regular rate & rhythm. +S3   Lungs: Rel clear  Rare crackle   Abdomen: soft, nontender, nondistended. No hepatosplenomegaly. No bruits or masses. Good bowel sounds. Extremities: no cyanosis, clubbing, rash,  Triv  edema Neuro: alert & oriented x 3, cranial nerves grossly intact. moves all 4 extremities w/o difficulty. Affect  pleasant.  ECG:  ST  136 bpm    Results for orders placed or performed during the hospital encounter of 01/27/16 (from the past 24 hour(s))  Glucose, capillary     Status: None   Collection Time: 01/28/16 11:54 AM  Result Value Ref Range   Glucose-Capillary 89 65 - 99 mg/dL   Comment 1 Notify RN   Glucose, capillary     Status: Abnormal   Collection Time: 01/28/16  5:34 PM  Result Value Ref Range   Glucose-Capillary 181 (H) 65 - 99 mg/dL  Glucose, capillary     Status: Abnormal   Collection Time: 01/28/16  9:17 PM  Result Value Ref Range   Glucose-Capillary 141 (H) 65 - 99 mg/dL  Procalcitonin     Status: None   Collection Time: 01/29/16  5:57 AM  Result Value Ref Range   Procalcitonin <0.10 ng/mL  CBC     Status: Abnormal   Collection Time: 01/29/16  5:57 AM  Result Value Ref Range   WBC 16.8 (H) 4.0 - 10.5 K/uL   RBC 4.69 3.87 - 5.11 MIL/uL   Hemoglobin 11.9 (L) 12.0 - 15.0 g/dL   HCT 37.6 36.0 - 46.0 %   MCV 80.2 78.0 - 100.0 fL   MCH 25.4 (L) 26.0 - 34.0 pg   MCHC 31.6 30.0 - 36.0 g/dL   RDW 13.7 11.5 - 15.5 %   Platelets 417 (H) 150 - 400 K/uL  Basic metabolic panel     Status: Abnormal   Collection Time: 01/29/16  5:57 AM  Result Value Ref Range   Sodium 138 135 - 145 mmol/L   Potassium 4.1 3.5 - 5.1 mmol/L   Chloride 106 101 - 111 mmol/L   CO2 20 (L) 22 - 32 mmol/L   Glucose, Bld 127 (H) 65 - 99 mg/dL   BUN 14 6 - 20 mg/dL   Creatinine, Ser 0.83 0.44 - 1.00 mg/dL   Calcium 9.4 8.9 - 10.3 mg/dL   GFR calc non Af Amer >60 >60 mL/min   GFR calc Af Amer >60 >60 mL/min   Anion gap 12 5 - 15  Glucose, capillary     Status: Abnormal   Collection Time: 01/29/16  7:55 AM  Result Value Ref Range   Glucose-Capillary 139 (H) 65 - 99 mg/dL  Ct Angio Chest Pe W/cm &/or Wo Cm  01/27/2016  CLINICAL DATA:  Shortness of breath and chest pain for 1 day EXAM: CT ANGIOGRAPHY CHEST WITH CONTRAST TECHNIQUE: Multidetector CT imaging of the chest was performed using the standard  protocol during bolus administration of intravenous contrast. Multiplanar CT image reconstructions and MIPs were obtained to evaluate the vascular anatomy. CONTRAST:  75 mL Isovue 370 nonionic COMPARISON:  Chest radiograph November 06, 2006 FINDINGS: Mediastinum/Lymph Nodes: There is no demonstrable pulmonary embolus. There is no thoracic aortic aneurysm or dissection. The visualized great vessels appear normal. The heart is mildly enlarged. There is no appreciable pericardial effusion. No thyroid lesions are evident. There is no appreciable thoracic adenopathy. Lungs/Pleura: There is diffuse airspace consolidation throughout the lungs bilaterally, somewhat more severe on the right than left. There is relative peripheral sparing bilaterally. Airspace opacity is seen throughout most lobes in segments to varying degrees with the least amount of airspace opacity in the left apex region. There are no appreciable pleural effusions. Upper abdomen: There is reflux of contrast into the inferior vena cava and hepatic veins. Visualized upper abdominal structures are otherwise normal. Musculoskeletal:  There are no blastic or lytic bone lesions. Review of the MIP images confirms the above findings. IMPRESSION: No demonstrable pulmonary embolus. Widespread alveolar opacity bilaterally, somewhat more severe on the right than on the left. Relative peripheral sparing is noted bilaterally. The appearance is consistent with widespread alveolar edema, either due to congestive heart failure or noncardiogenic etiology. Given pregnancy and history of hypertension, preeclampsia as a cause of widespread edema must be of concern. Differential considerations must include widespread infectious pneumonia, diffuse aspiration, or pulmonary hemorrhage. Reflux of contrast into the inferior vena cava and hepatic veins may be indicative of a degree of increased right heart pressure. No adenopathy evident. Electronically Signed   By: Lowella Grip  III M.D.   On: 01/27/2016 13:05     ASSESSMENT: Pt is a 30 yo with newly diagnosed CHF  She has had preeclampsia with both pregnancies and she also reports transient hypertension after first baby was born Now 74 months post delivery.   On exam, evid of some mild volume increase  CXR from 3/30 shows evid of CHF   Echo on 3/31 LVnoted to be dilated LVEF  Mild LVH  LVEF 35%  RVEF normlal  ? 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   ? Idiopathic I am not convinced related to uncontrolled BP (and burned out ventricular function) I do not think ischemic given age.  She says she has not been a diabetic long   Recomm:  I would initiate meds for CHF  BP has ample room to tolerate  D/C labetalol   Coreg  6.25 bid Entresto   Lasix x 1  Follow. Will follow response   Check TSH  Pt says she had an echo someplace prior to last delivery  Does not remember where  ? [facility]  Try to find    2.  HTN  As noted above  Follow with med changes  3.  OB/GYN  I would stop breast feeding  It sounds like she has nearly done so.  I would need to review Entresto otherwise I have advised against further pregnancies give all of above  4.  DM  Poorly controlled  As A1C is 8.3  Needs to Rx for tighter control    5  HCM  Check lipids   Hold statin for now.

## 2016-01-29 NOTE — Progress Notes (Addendum)
Patient ID: Sara [name], female   DOB: 07-01-1986, 30 y.o.   MRN: [id number]  [facility] PROGRESS NOTE  Sara [name] [id number] DOB: 1986-06-29 DOA: 01/27/2016 PCP: Lennie Odor, PA-C   Brief narrative:    30 year old female patient with recent vaginal delivery June 2016 currently breast-feeding, pregnancy was complicated by preeclampsia, diabetes on Lantus and prior hypertension several years ago currently not on medication. Over the past several days pt had increasing paroxysmal nocturnal dyspnea, no fevers or chills, no similar events in the past.   In ED, CT angiogram of the chest with PE protocol: with no pulmonary embolus; noted with widespread alveolar opacity bilaterally R > L, ? congestive heart failure, pneumonia or pulmonary hemorrhage  Assessment/Plan:    Principal Problem:   CAP (community acquired pneumonia) - continue with Rocephin and Zithromax day #3 - currently no need for oxygen via  - improving  - provide antitussives as needed   Active Problems: Newly diagnosed CHF, systolic EF [id number] - still with mild volume overload - Echo on 3/31 LV noted to be dilated LVEF Mild LVH LVEF 35% RVEF normlal - follow up on cardiology recommendations  - added Coreg    Diabetes mellitus with insulin therapy ([place]) - continue home regimen with insulin  - also on SSI     History of migraine headaches - with aura - provide analgesia as needed  - better this AM    BMI 36.0-36.9,adult - Body mass index is 37.09 kg/(m^2)    Hypertensive emergency / tachycardia  - SBP still in 160's  - continue with Lasix, added Coreg per cardiology  - follow up     Breastfeeding (infant)  DVT prophylaxis - Lovenox SQ  Code Status: Full.  Family Communication:  plan of care discussed with the patient Disposition Plan: Home by 4/3   IV access:  Peripheral IV  Procedures and diagnostic studies:    Ct Angio Chest Pe W/cm &/or Wo Cm 01/27/2016  No demonstrable  pulmonary embolus. Widespread alveolar opacity bilaterally, somewhat more severe on the right than on the left. Relative peripheral sparing is noted bilaterally. The appearance is consistent with widespread alveolar edema, either due to congestive heart failure or noncardiogenic etiology. Given pregnancy and history of hypertension, preeclampsia as a cause of widespread edema must be of concern. Differential considerations must include widespread infectious pneumonia, diffuse aspiration, or pulmonary hemorrhage. Reflux of contrast into the inferior vena cava and hepatic veins may be indicative of a degree of increased right heart pressure. No adenopathy evident.  Medical Consultants:  Cardiology   Other Consultants:  None  IAnti-Infectives:   Zithromax 3/30 --> Rocephin 3/30 -->   Faye Ramsay, MD  [facility] Pager [phone]  If 7PM-7AM, please contact night-coverage [email or web address] Password [email or web address] 01/29/2016, 4:56 PM  HPI/Subjective: No events overnight.   Objective: Filed Vitals:   01/29/16 0524 01/29/16 0627 01/29/16 1037 01/29/16 1356  BP: 170/122 164/109 165/109 165/98  Pulse: 125  117 126  Temp: 98.6 F (37 C)   98.5 F (36.9 C)  TempSrc: Oral   Oral  Resp: 18   19  Height:      Weight:      SpO2: 91%  97% 93%    Intake/Output Summary (Last 24 hours) at 01/29/16 1656 Last data filed at 01/29/16 1033  Gross per 24 hour  Intake 1293.33 ml  Output      0 ml  Net 1293.33 ml    Exam:   General:  Pt is alert,  follows commands appropriately, not in acute distress  Cardiovascular: Regular rhythm, tachycardic, no rubs, no gallops  Respiratory: bibasilar crackles, noted but no wheezing  Abdomen: Soft, non tender, non distended, bowel sounds present, no guarding  Extremities: pulses DP and PT palpable bilaterally  Neuro: Grossly nonfocal  Data Reviewed: Basic Metabolic Panel:  Recent Labs Lab 01/27/16 1116 01/28/16 0601 01/29/16 0557  NA 140 141 138  K 4.1  3.7 4.1  CL 111 110 106  CO2 20* 20* 20*  GLUCOSE 161* 87 127*  BUN 12 9 14   CREATININE 0.67 0.73 0.83  CALCIUM 9.2 9.1 9.4   CBC:  Recent Labs Lab 01/27/16 1116 01/28/16 0601 01/29/16 0557  WBC 15.4* 16.3* 16.8*  NEUTROABS 12.2*  --   --   HGB 12.2 11.8* 11.9*  HCT 38.4 36.0 37.6  MCV 80.2 80.9 80.2  PLT 385 352 417*   CBG:  Recent Labs Lab 01/28/16 1154 01/28/16 1734 01/28/16 2117 01/29/16 0755 01/29/16 1148  GLUCAP 89 181* 141* 139* 160*    Recent Results (from the past 240 hour(s))  Culture, blood (routine x 2)     Status: None (Preliminary result)   Collection Time: 01/27/16  2:32 PM  Result Value Ref Range Status   Specimen Description BLOOD RIGHT ANTECUBITAL  Final   Special Requests BOTTLES DRAWN AEROBIC AND ANAEROBIC 5CC  Final   Culture NO GROWTH 2 DAYS  Final   Report Status PENDING  Incomplete  Culture, blood (routine x 2)     Status: None (Preliminary result)   Collection Time: 01/27/16  2:35 PM  Result Value Ref Range Status   Specimen Description BLOOD RIGHT HAND  Final   Special Requests BOTTLES DRAWN AEROBIC ONLY 5CC  Final   Culture NO GROWTH 2 DAYS  Final   Report Status PENDING  Incomplete    Scheduled Meds: . azithromycin  500 mg Intravenous Q24H  . benzonatate  200 mg Oral TID  . cefTRIAXone (ROCEPHIN)  IV  1 g Intravenous Q24H  . enoxaparin (LOVENOX) injection  40 mg Subcutaneous Q24H  . insulin aspart  0-5 Units Subcutaneous QHS  . insulin aspart  0-9 Units Subcutaneous TID WC  . insulin glargine  36 Units Subcutaneous QHS  . ipratropium-albuterol  3 mL Nebulization Q6H  . loratadine  10 mg Oral Daily   Continuous Infusions:

## 2016-01-30 ENCOUNTER — Inpatient Hospital Stay (HOSPITAL_COMMUNITY): Payer: Medicaid Other

## 2016-01-30 DIAGNOSIS — I10 Essential (primary) hypertension: Secondary | ICD-10-CM | POA: Insufficient documentation

## 2016-01-30 DIAGNOSIS — I5023 Acute on chronic systolic (congestive) heart failure: Secondary | ICD-10-CM | POA: Insufficient documentation

## 2016-01-30 LAB — CBC
HCT: 42.7 % (ref 36.0–46.0)
Hemoglobin: 13.8 g/dL (ref 12.0–15.0)
MCH: 25.8 pg — ABNORMAL LOW (ref 26.0–34.0)
MCHC: 32.3 g/dL (ref 30.0–36.0)
MCV: 79.8 fL (ref 78.0–100.0)
PLATELETS: 439 10*3/uL — AB (ref 150–400)
RBC: 5.35 MIL/uL — ABNORMAL HIGH (ref 3.87–5.11)
RDW: 13.4 % (ref 11.5–15.5)
WBC: 18.6 10*3/uL — AB (ref 4.0–10.5)

## 2016-01-30 LAB — GLUCOSE, CAPILLARY
GLUCOSE-CAPILLARY: 203 mg/dL — AB (ref 65–99)
GLUCOSE-CAPILLARY: 119 mg/dL — AB (ref 65–99)
GLUCOSE-CAPILLARY: 147 mg/dL — AB (ref 65–99)
Glucose-Capillary: 201 mg/dL — ABNORMAL HIGH (ref 65–99)
Glucose-Capillary: 166 mg/dL — ABNORMAL HIGH (ref 65–99)

## 2016-01-30 LAB — BASIC METABOLIC PANEL
ANION GAP: 11 (ref 5–15)
BUN: 11 mg/dL (ref 6–20)
CALCIUM: 9.6 mg/dL (ref 8.9–10.3)
CO2: 22 mmol/L (ref 22–32)
Chloride: 103 mmol/L (ref 101–111)
Creatinine, Ser: 0.75 mg/dL (ref 0.44–1.00)
GFR calc Af Amer: >60 mL/min (ref >60)
GLUCOSE: 125 mg/dL — AB (ref 65–99)
POTASSIUM: 3.7 mmol/L (ref 3.5–5.1)
Sodium: 136 mmol/L (ref 135–145)

## 2016-01-30 LAB — LIPID PANEL
CHOL/HDL RATIO: 3.7 ratio
CHOLESTEROL: 141 mg/dL (ref 0–200)
HDL: 38 mg/dL — ABNORMAL LOW (ref >40)
LDL Cholesterol: 70 mg/dL (ref 0–99)
Triglycerides: 163 mg/dL — ABNORMAL HIGH (ref <150)
VLDL: 33 mg/dL (ref 0–40)

## 2016-01-30 LAB — BRAIN NATRIURETIC PEPTIDE: B NATRIURETIC PEPTIDE 5: 201.6 pg/mL — AB (ref 0.0–100.0)

## 2016-01-30 LAB — TSH: TSH: 1.147 u[IU]/mL (ref 0.350–4.500)

## 2016-01-30 MED ORDER — FUROSEMIDE 10 MG/ML IJ SOLN
40.0000 mg | Freq: Two times a day (BID) | INTRAMUSCULAR | Status: DC
  Administered 2016-01-30 – 2016-01-31 (×2): 40 mg via INTRAVENOUS
  Filled 2016-01-30 (×2): qty 4

## 2016-01-30 MED ORDER — AZITHROMYCIN 250 MG PO TABS
500.0000 mg | ORAL_TABLET | Freq: Every day | ORAL | Status: AC
  Administered 2016-01-30 – 2016-02-02 (×4): 500 mg via ORAL
  Filled 2016-01-30 (×3): qty 1
  Filled 2016-01-30 (×2): qty 2

## 2016-01-30 MED ORDER — CARVEDILOL 12.5 MG PO TABS
12.5000 mg | ORAL_TABLET | Freq: Two times a day (BID) | ORAL | Status: DC
  Administered 2016-01-30 – 2016-01-31 (×2): 12.5 mg via ORAL
  Filled 2016-01-30 (×2): qty 1

## 2016-01-30 MED ORDER — BUTALBITAL-APAP-CAFFEINE 50-325-40 MG PO TABS
1.0000 | ORAL_TABLET | ORAL | Status: DC | PRN
  Administered 2016-01-31 (×2): 1 via ORAL
  Filled 2016-01-30 (×2): qty 1

## 2016-01-30 NOTE — Progress Notes (Addendum)
   Subjective: Breathing is some better   Objective: Filed Vitals:   01/29/16 2154 01/30/16 0601 01/30/16 0604 01/30/16 0701  BP: 145/94 157/113  163/98  Pulse: 123 120  120  Temp: 98.2 F (36.8 C) 98.8 F (37.1 C)    TempSrc: Oral Oral    Resp: 16 18    Height:      Weight:   172 lb 12.8 oz (78.382 kg)   SpO2: 97% 96%     Weight change:   Intake/Output Summary (Last 24 hours) at 01/30/16 0743 Last data filed at 01/29/16 1758  Gross per 24 hour  Intake    120 ml  Output    800 ml  Net   -680 ml   I/O  INCOMPLETE  General: Alert, awake, oriented x3, in no acute distress Neck:  JVP is increased   Heart: Regular rate and rhythm, Tachy  +s3  Lungs: Clear to auscultation.  No rales or wheezes. Exemities:  Tr edema.   Neuro: Grossly intact, nonfocal.   Lab Results: Results for orders placed or performed during the hospital encounter of 01/27/16 (from the past 24 hour(s))  Glucose, capillary     Status: Abnormal   Collection Time: 01/29/16  7:55 AM  Result Value Ref Range   Glucose-Capillary 139 (H) 65 - 99 mg/dL  Glucose, capillary     Status: Abnormal   Collection Time: 01/29/16 11:48 AM  Result Value Ref Range   Glucose-Capillary 160 (H) 65 - 99 mg/dL  Glucose, capillary     Status: Abnormal   Collection Time: 01/29/16  5:44 PM  Result Value Ref Range   Glucose-Capillary 145 (H) 65 - 99 mg/dL  Glucose, capillary     Status: Abnormal   Collection Time: 01/29/16  9:50 PM  Result Value Ref Range   Glucose-Capillary 203 (H) 65 - 99 mg/dL   Comment 1 Notify RN    Comment 2 Document in Chart     Studies/Results: No results found.  Medications: Reviewed  @PROBHOSP @  1  Acute systolic CHF  Pt volume is some inproved from yesterday  Still with evid of some volume increase  Would continue lasix  Get PA /LAt CXR Continue Coreg Increase dose  Continue Entrestor Remains tachycardic and hypertensive   Follow with meds   Get EKG Put on telemetry    2  HTN  Increase  meds    3  DM  Needs diet education  Poor control  Alos on NA   44 OB  Stopping Breastfeeding  Rec no further pregnancies    LOS: 2 days   Dorris Carnes 01/30/2016, 7:43 AM

## 2016-01-30 NOTE — Progress Notes (Addendum)
Patient ID: Sara [name], female   DOB: [birth year], 30 y.o.   MRN: [id number]  [facility] PROGRESS NOTE  Sara [name] [id number] DOB: 04-17-1986 DOA: 01/27/2016 PCP: Lennie Odor, PA-C   Brief narrative:    30 year old female patient with recent vaginal delivery June 2016 currently breast-feeding, pregnancy was complicated by preeclampsia, diabetes on Lantus and prior hypertension several years ago currently not on medication. Over the past several days pt had increasing paroxysmal nocturnal dyspnea, no fevers or chills, no similar events in the past.   In ED, CT angiogram of the chest with PE protocol: with no pulmonary embolus; noted with widespread alveolar opacity bilaterally R > L, ? congestive heart failure, pneumonia or pulmonary hemorrhage  Assessment/Plan:    Principal Problem:   CAP (community acquired pneumonia) - continue with Rocephin and Zithromax day #4/7 - currently no need for oxygen via [place] - provide antitussives as needed  - not clear why WBC still trending up, monitor for now   Active Problems: Newly diagnosed CHF, systolic EF [id number] - Echo on 3/31 LV noted to be dilated LVEF Mild LVH LVEF 35% RVEF normlal - follow up on cardiology recommendations  - added Coreg and Entresto, continue with Lasix      Diabetes mellitus with insulin therapy ([place]) - continue home regimen with insulin  - also on SSI     History of migraine headaches - with aura - continue to provide analgesia as needed  - better this AM    BMI 36.0-36.9,adult - Body mass index is 37.09 kg/(m^2)    Hypertensive emergency / tachycardia  - SBP still in 160's with HR up in 120's  - added Coreg and Entresto per cardiology  - monitor on tele     Breastfeeding (infant)  DVT prophylaxis - Lovenox SQ  Code Status: Full.  Family Communication:  plan of care discussed with the patient Disposition Plan: Home by 4/4  IV access:  Peripheral IV  Procedures and diagnostic  studies:    Ct Angio Chest Pe W/cm &/or Wo Cm 01/27/2016  No demonstrable pulmonary embolus. Widespread alveolar opacity bilaterally, somewhat more severe on the right than on the left. Relative peripheral sparing is noted bilaterally. The appearance is consistent with widespread alveolar edema, either due to congestive heart failure or noncardiogenic etiology. Given pregnancy and history of hypertension, preeclampsia as a cause of widespread edema must be of concern. Differential considerations must include widespread infectious pneumonia, diffuse aspiration, or pulmonary hemorrhage. Reflux of contrast into the inferior vena cava and hepatic veins may be indicative of a degree of increased right heart pressure. No adenopathy evident.  Medical Consultants:  Cardiology   Other Consultants:  None  IAnti-Infectives:   Zithromax 3/30 --> Rocephin 3/30 -->   Faye Ramsay, MD  [facility] Pager [phone]  If 7PM-7AM, please contact night-coverage [email or web address] Password [facility] 01/30/2016, 12:31 PM  HPI/Subjective: No events overnight. Still with headaches.  Objective: Filed Vitals:   01/30/16 0601 01/30/16 0604 01/30/16 0701 01/30/16 0911  BP: 157/113  163/98 150/104  Pulse: 120  120 118  Temp: 98.8 F (37.1 C)     TempSrc: Oral     Resp: 18     Height:      Weight:  78.382 kg (172 lb 12.8 oz)    SpO2: 96%       Intake/Output Summary (Last 24 hours) at 01/30/16 1231 Last data filed at 01/30/16 1111  Gross per 24 hour  Intake    300 ml  Output   2190 ml  Net  -1890 ml    Exam:   General:  Pt is alert, follows commands appropriately, not in acute distress  Cardiovascular: Regular rhythm, tachycardic, no rubs, no gallops  Respiratory: bibasilar crackles, noted but no wheezing  Abdomen: Soft, non tender, non distended, bowel sounds present, no guarding  Extremities: pulses DP and PT palpable bilaterally  Neuro: Grossly nonfocal  Data Reviewed: Basic Metabolic  Panel:  Recent Labs Lab 01/27/16 1116 01/28/16 0601 01/29/16 0557 01/30/16 0801  NA 140 141 138 136  K 4.1 3.7 4.1 3.7  CL 111 110 106 103  CO2 20* 20* 20* 22  GLUCOSE 161* 87 127* 125*  BUN 12 9 14 11   CREATININE 0.67 0.73 0.83 0.75  CALCIUM 9.2 9.1 9.4 9.6   CBC:  Recent Labs Lab 01/27/16 1116 01/28/16 0601 01/29/16 0557 01/30/16 0801  WBC 15.4* 16.3* 16.8* 18.6*  NEUTROABS 12.2*  --   --   --   HGB 12.2 11.8* 11.9* 13.8  HCT 38.4 36.0 37.6 42.7  MCV 80.2 80.9 80.2 79.8  PLT 385 352 417* 439*   CBG:  Recent Labs Lab 01/29/16 1148 01/29/16 1744 01/29/16 2150 01/30/16 0754 01/30/16 1144  GLUCAP 160* 145* 203* 119* 147*    Recent Results (from the past 240 hour(s))  Culture, blood (routine x 2)     Status: None (Preliminary result)   Collection Time: 01/27/16  2:32 PM  Result Value Ref Range Status   Specimen Description BLOOD RIGHT ANTECUBITAL  Final   Special Requests BOTTLES DRAWN AEROBIC AND ANAEROBIC 5CC  Final   Culture NO GROWTH 2 DAYS  Final   Report Status PENDING  Incomplete  Culture, blood (routine x 2)     Status: None (Preliminary result)   Collection Time: 01/27/16  2:35 PM  Result Value Ref Range Status   Specimen Description BLOOD RIGHT HAND  Final   Special Requests BOTTLES DRAWN AEROBIC ONLY 5CC  Final   Culture NO GROWTH 2 DAYS  Final   Report Status PENDING  Incomplete     MEDS SCHEDULED: . azithromycin  500 mg Intravenous Q24H  . benzonatate  200 mg Oral TID  . carvedilol  6.25 mg Oral BID WC  . cefTRIAXone (ROCEPHIN)  IV  1 g Intravenous Q24H  . enoxaparin (LOVENOX) injection  40 mg Subcutaneous Q24H  . furosemide  20 mg Intravenous Daily  . insulin aspart  0-5 Units Subcutaneous QHS  . insulin aspart  0-9 Units Subcutaneous TID WC  . insulin glargine  36 Units Subcutaneous QHS  . loratadine  10 mg Oral Daily  . sacubitril-valsartan  1 tablet Oral BID

## 2016-01-30 NOTE — Progress Notes (Signed)
PHARMACIST - PHYSICIAN COMMUNICATION DR:   Doyle Askew CONCERNING: Antibiotic IV to Oral Route Change Policy  RECOMMENDATION: This patient is receiving Azithromycin  by the intravenous route.  Based on criteria approved by the Pharmacy and Therapeutics Committee, the antibiotic(s) is/are being converted to the equivalent oral dose form(s).   DESCRIPTION: These criteria include:  Patient being treated for a respiratory tract infection, urinary tract infection, cellulitis or clostridium difficile associated diarrhea if on metronidazole  The patient is not neutropenic and does not exhibit a GI malabsorption state  The patient is eating (either orally or via tube) and/or has been taking other orally administered medications for a least 24 hours  The patient is improving clinically and has a Tmax < 100.5  If you have questions about this conversion, please contact the Pharmacy Department  []   [phone] )  [facility] []   [phone] )  [facility] [x]   [phone] )  [facility] []   [phone] )  [facility] []   [phone] )  [place], PharmD, BCPS Clinical Pharmacist Pager: [phone] 01/30/2016 1:11 PM

## 2016-01-31 LAB — GLUCOSE, CAPILLARY
GLUCOSE-CAPILLARY: 172 mg/dL — AB (ref 65–99)
GLUCOSE-CAPILLARY: 272 mg/dL — AB (ref 65–99)
Glucose-Capillary: 138 mg/dL — ABNORMAL HIGH (ref 65–99)
Glucose-Capillary: 230 mg/dL — ABNORMAL HIGH (ref 65–99)

## 2016-01-31 LAB — CBC
HEMATOCRIT: 45.4 % (ref 36.0–46.0)
HEMOGLOBIN: 14.6 g/dL (ref 12.0–15.0)
MCH: 25.8 pg — ABNORMAL LOW (ref 26.0–34.0)
MCHC: 32.2 g/dL (ref 30.0–36.0)
MCV: 80.2 fL (ref 78.0–100.0)
Platelets: 413 10*3/uL — ABNORMAL HIGH (ref 150–400)
RBC: 5.66 MIL/uL — ABNORMAL HIGH (ref 3.87–5.11)
RDW: 13.5 % (ref 11.5–15.5)
WBC: 15.1 10*3/uL — ABNORMAL HIGH (ref 4.0–10.5)

## 2016-01-31 LAB — BASIC METABOLIC PANEL
ANION GAP: 15 (ref 5–15)
BUN: 13 mg/dL (ref 6–20)
CHLORIDE: 100 mmol/L — AB (ref 101–111)
CO2: 23 mmol/L (ref 22–32)
CREATININE: 0.79 mg/dL (ref 0.44–1.00)
Calcium: 9.5 mg/dL (ref 8.9–10.3)
GFR calc non Af Amer: >60 mL/min (ref >60)
Glucose, Bld: 117 mg/dL — ABNORMAL HIGH (ref 65–99)
Potassium: 3.4 mmol/L — ABNORMAL LOW (ref 3.5–5.1)
Sodium: 138 mmol/L (ref 135–145)

## 2016-01-31 LAB — PROCALCITONIN: PROCALCITONIN: 0.11 ng/mL

## 2016-01-31 MED ORDER — ISOSORB DINITRATE-HYDRALAZINE 20-37.5 MG PO TABS
1.0000 | ORAL_TABLET | Freq: Three times a day (TID) | ORAL | Status: DC
  Administered 2016-01-31: 1 via ORAL
  Filled 2016-01-31: qty 1

## 2016-01-31 MED ORDER — CARVEDILOL 6.25 MG PO TABS
6.2500 mg | ORAL_TABLET | Freq: Two times a day (BID) | ORAL | Status: DC
Start: 2016-01-31 — End: 2016-02-02
  Administered 2016-01-31 – 2016-02-02 (×4): 6.25 mg via ORAL
  Filled 2016-01-31 (×4): qty 1

## 2016-01-31 MED ORDER — DIGOXIN 125 MCG PO TABS
0.1250 mg | ORAL_TABLET | Freq: Every day | ORAL | Status: DC
Start: 2016-01-31 — End: 2016-02-01
  Administered 2016-01-31: 0.125 mg via ORAL
  Filled 2016-01-31 (×2): qty 1

## 2016-01-31 MED ORDER — SODIUM CHLORIDE 0.9 % IV BOLUS (SEPSIS)
500.0000 mL | Freq: Once | INTRAVENOUS | Status: AC
  Administered 2016-01-31: 500 mL via INTRAVENOUS

## 2016-01-31 NOTE — Progress Notes (Addendum)
Patient ID: CHALINA DENIGRIS, female   DOB: Sep 20, 1986, 30 y.o.   MRN: [id number]  [facility] PROGRESS NOTE  JAVITA SORRELLS [id number] DOB: October 27, 1986 DOA: 01/27/2016 PCP: Lennie Odor, PA-C   Brief narrative:    30 year old female patient with recent vaginal delivery June 2016 currently breast-feeding, pregnancy was complicated by preeclampsia, diabetes on Lantus and prior hypertension several years ago currently not on medication. Over the past several days pt had increasing paroxysmal nocturnal dyspnea, no fevers or chills, no similar events in the past.   In ED, CT angiogram of the chest with PE protocol: with no pulmonary embolus; noted with widespread alveolar opacity bilaterally R > L, ? congestive heart failure, pneumonia or pulmonary hemorrhage  Major events since admission: 4/3 - transfer pt to step down unit due to low BP   Assessment/Plan:    Principal Problem:   Hypotension and dizziness 4/3 - cardiology at bedside, bolus given, transfer to SDU     CAP (community acquired pneumonia) - continue with Rocephin and Zithromax day #5/7 - currently no need for oxygen via [place] - provide antitussives as needed  - not clear why WBC still trending up, monitor for now   Active Problems: Acute on chronic systolic CHF, EF [id number] - Echo on 3/31 LV noted to be dilated LVEF Mild LVH LVEF 35% RVEF normlal - follow up on cardiology recommendations  - was hypotensive this AM with dizziness, cardiology to adjust the med dosing and consider stopping entresto until BP stabilizes     Diabetes mellitus with insulin therapy ([place]) - continue home regimen with insulin  - also on SSI     History of migraine headaches - with aura - continue to provide analgesia as needed  - better this AM    BMI 36.0-36.9,adult - Body mass index is 37.09 kg/(m^2)    Hypokalemia - mild, supplement and repeat BMP in AM    Thrombocytopenia - reactive, monitor     Hypertensive emergency /  tachycardia on admission  - SBP still in 160's with HR up in 120's  - added Coreg and Entresto per cardiology but due to soft BP this am and dizziness, consider stopping Entresto  - monitor on tele     Breastfeeding (infant)  DVT prophylaxis - Lovenox SQ  Code Status: Full.  Family Communication:  plan of care discussed with the patient Disposition Plan: transfer to SDU   IV access:  Peripheral IV  Procedures and diagnostic studies:    Ct Angio Chest Pe W/cm &/or Wo Cm 01/27/2016  No demonstrable pulmonary embolus. Widespread alveolar opacity bilaterally, somewhat more severe on the right than on the left. Relative peripheral sparing is noted bilaterally. The appearance is consistent with widespread alveolar edema, either due to congestive heart failure or noncardiogenic etiology. Given pregnancy and history of hypertension, preeclampsia as a cause of widespread edema must be of concern. Differential considerations must include widespread infectious pneumonia, diffuse aspiration, or pulmonary hemorrhage. Reflux of contrast into the inferior vena cava and hepatic veins may be indicative of a degree of increased right heart pressure. No adenopathy evident.  Medical Consultants:  Cardiology   Other Consultants:  None  IAnti-Infectives:   Zithromax 3/30 --> Rocephin 3/30 -->   Faye Ramsay, MD  [facility] Pager [phone]  If 7PM-7AM, please contact night-coverage [email or web address] Password [email or web address] 01/31/2016, 2:46 PM  HPI/Subjective: No events overnight. Some dizziness this AM.   Objective: Filed Vitals:   01/31/16 1340 01/31/16 1351 01/31/16 1409 01/31/16 1422  BP:  96/48 97/40 119/59 127/68  Pulse: 116 117 118   Temp:      TempSrc:      Resp:      Height:      Weight:      SpO2:        Intake/Output Summary (Last 24 hours) at 01/31/16 1446 Last data filed at 01/31/16 1100  Gross per 24 hour  Intake    648 ml  Output    700 ml  Net    -52 ml    Exam:   General:  Pt  is alert, follows commands appropriately, not in acute distress  Cardiovascular: Regular rhythm, tachycardic, no rubs, no gallops  Respiratory: bibasilar crackles, noted but no wheezing  Abdomen: Soft, non tender, non distended, bowel sounds present, no guarding  Extremities: pulses DP and PT palpable bilaterally  Neuro: Grossly nonfocal  Data Reviewed: Basic Metabolic Panel:  Recent Labs Lab 01/27/16 1116 01/28/16 0601 01/29/16 0557 01/30/16 0801 01/31/16 0535  NA 140 141 138 136 138  K 4.1 3.7 4.1 3.7 3.4*  CL 111 110 106 103 100*  CO2 20* 20* 20* 22 23  GLUCOSE 161* 87 127* 125* 117*  BUN 12 9 14 11 13   CREATININE 0.67 0.73 0.83 0.75 0.79  CALCIUM 9.2 9.1 9.4 9.6 9.5   CBC:  Recent Labs Lab 01/27/16 1116 01/28/16 0601 01/29/16 0557 01/30/16 0801 01/31/16 0535  WBC 15.4* 16.3* 16.8* 18.6* 15.1*  NEUTROABS 12.2*  --   --   --   --   HGB 12.2 11.8* 11.9* 13.8 14.6  HCT 38.4 36.0 37.6 42.7 45.4  MCV 80.2 80.9 80.2 79.8 80.2  PLT 385 352 417* 439* 413*   CBG:  Recent Labs Lab 01/30/16 1144 01/30/16 1700 01/30/16 2135 01/31/16 0821 01/31/16 1144  GLUCAP 147* 201* 166* 172* 272*    Recent Results (from the past 240 hour(s))  Culture, blood (routine x 2)     Status: None (Preliminary result)   Collection Time: 01/27/16  2:32 PM  Result Value Ref Range Status   Specimen Description BLOOD RIGHT ANTECUBITAL  Final   Special Requests BOTTLES DRAWN AEROBIC AND ANAEROBIC 5CC  Final   Culture NO GROWTH 4 DAYS  Final   Report Status PENDING  Incomplete  Culture, blood (routine x 2)     Status: None (Preliminary result)   Collection Time: 01/27/16  2:35 PM  Result Value Ref Range Status   Specimen Description BLOOD RIGHT HAND  Final   Special Requests BOTTLES DRAWN AEROBIC ONLY 5CC  Final   Culture NO GROWTH 4 DAYS  Final   Report Status PENDING  Incomplete     MEDS SCHEDULED: . azithromycin  500 mg Oral Daily  . benzonatate  200 mg Oral TID  .  carvedilol  12.5 mg Oral BID WC  . cefTRIAXone (ROCEPHIN)  IV  1 g Intravenous Q24H  . digoxin  0.125 mg Oral Daily  . enoxaparin (LOVENOX) injection  40 mg Subcutaneous Q24H  . insulin aspart  0-5 Units Subcutaneous QHS  . insulin aspart  0-9 Units Subcutaneous TID WC  . insulin glargine  36 Units Subcutaneous QHS  . loratadine  10 mg Oral Daily  . sacubitril-valsartan  1 tablet Oral BID  . sodium chloride  500 mL Intravenous Once

## 2016-01-31 NOTE — Progress Notes (Signed)
About to check orthostatics when patient complained of feeling dizzy heart racing. Went to stand and unable coming back into bed. Alert and oriented vitals checked stable but B/P dropped from previous check. Dr. Darlyn Chamber will continue to monitor.

## 2016-01-31 NOTE — Progress Notes (Signed)
Subjective: Dizzy when standing. SOB with exertion. Denies PND/Orhtopnea. Sitting in chair to perform ADLs.  Objective:  Scheduled Meds: . azithromycin  500 mg Oral Daily  . benzonatate  200 mg Oral TID  . carvedilol  12.5 mg Oral BID WC  . cefTRIAXone (ROCEPHIN)  IV  1 g Intravenous Q24H  . enoxaparin (LOVENOX) injection  40 mg Subcutaneous Q24H  . furosemide  40 mg Intravenous BID  . insulin aspart  0-5 Units Subcutaneous QHS  . insulin aspart  0-9 Units Subcutaneous TID WC  . insulin glargine  36 Units Subcutaneous QHS  . loratadine  10 mg Oral Daily  . sacubitril-valsartan  1 tablet Oral BID   Continuous Infusions:  PRN Meds:.butalbital-acetaminophen-caffeine, diphenhydrAMINE, hydrALAZINE, HYDROcodone-homatropine, HYDROmorphone (DILAUDID) injection, ipratropium-albuterol  Filed Vitals:   01/30/16 1436 01/30/16 2139 01/31/16 0500 01/31/16 0547  BP: 140/97 138/98  146/92  Pulse: 113 118  114  Temp: 98 F (36.7 C) 98.5 F (36.9 C)  98.4 F (36.9 C)  TempSrc: Oral Oral  Oral  Resp: 19 16  21   Height:      Weight:   169 lb 1.6 oz (76.703 kg)   SpO2: 90% 97%  98%   Weight change: -3 lb 11.2 oz (-1.678 kg)  Intake/Output Summary (Last 24 hours) at 01/31/16 [id number] Last data filed at 01/31/16 0917  Gross per 24 hour  Intake    628 ml  Output   2290 ml  Net  -1662 ml     General: Alert, awake, oriented x3, in no acute distress. Sitting in a chair.  Neck:  JVP 6-7   Heart: Regular rate and rhythm, Tachy  +s3  Lungs: Clear to auscultation.  No rales or wheezes. Exemities:  Tr edema.   Neuro: Grossly intact, nonfocal.  EKG: ST- 120-130s.    Lab Results: Results for orders placed or performed during the hospital encounter of 01/27/16 (from the past 24 hour(s))  Glucose, capillary     Status: Abnormal   Collection Time: 01/30/16 11:44 AM  Result Value Ref Range   Glucose-Capillary 147 (H) 65 - 99 mg/dL  Glucose, capillary     Status: Abnormal   Collection Time:  01/30/16  5:00 PM  Result Value Ref Range   Glucose-Capillary 201 (H) 65 - 99 mg/dL  Glucose, capillary     Status: Abnormal   Collection Time: 01/30/16  9:35 PM  Result Value Ref Range   Glucose-Capillary 166 (H) 65 - 99 mg/dL  CBC     Status: Abnormal   Collection Time: 01/31/16  5:35 AM  Result Value Ref Range   WBC 15.1 (H) 4.0 - 10.5 K/uL   RBC 5.66 (H) 3.87 - 5.11 MIL/uL   Hemoglobin 14.6 12.0 - 15.0 g/dL   HCT 45.4 36.0 - 46.0 %   MCV 80.2 78.0 - 100.0 fL   MCH 25.8 (L) 26.0 - 34.0 pg   MCHC 32.2 30.0 - 36.0 g/dL   RDW 13.5 11.5 - 15.5 %   Platelets 413 (H) 150 - 400 K/uL  Basic metabolic panel     Status: Abnormal   Collection Time: 01/31/16  5:35 AM  Result Value Ref Range   Sodium 138 135 - 145 mmol/L   Potassium 3.4 (L) 3.5 - 5.1 mmol/L   Chloride 100 (L) 101 - 111 mmol/L   CO2 23 22 - 32 mmol/L   Glucose, Bld 117 (H) 65 - 99 mg/dL   BUN 13 6 - 20 mg/dL  Creatinine, Ser 0.79 0.44 - 1.00 mg/dL   Calcium 9.5 8.9 - 10.3 mg/dL   GFR calc non Af Amer >60 >60 mL/min   GFR calc Af Amer >60 >60 mL/min   Anion gap 15 5 - 15  Glucose, capillary     Status: Abnormal   Collection Time: 01/31/16  8:21 AM  Result Value Ref Range   Glucose-Capillary 172 (H) 65 - 99 mg/dL    Studies/Results: Dg Chest 2 View  01/30/2016  CLINICAL DATA:  30 year old female with shortness of breath and cough for 3 days. EXAM: CHEST  2 VIEW COMPARISON:  01/27/2016 CT and 11/06/2006 chest radiograph FINDINGS: Moderate cardiomegaly identified. There is no evidence of focal airspace disease, pulmonary edema, suspicious pulmonary nodule/mass, pleural effusion, or pneumothorax. No acute bony abnormalities are identified. IMPRESSION: Moderate cardiomegaly without evidence of acute cardiopulmonary disease. Electronically Signed   By: Margarette Canada M.D.   On: 01/30/2016 15:15    Medications: Reviewed    1  Acute systolic CHF- ECHO EF [id number].  - ? Viral Cardiomyopathy. Also pre-eclampsia with both  pregnancies.  Will need CMRI. Had cold like symptoms about a month ago. Also her kids had flu. Stopped breast feeding a couple days ago. NYHA III. Volume status low. Hold lasix for now. Check orthostatics.  Tacycardic may need to cut back BB but for now continue Coreg 12.5 mg twice a day. Add 0.125 mg dig.  - Continue Entresto 24-26 mg twice a day  - Add bidil 1 tab tid.  - Consult cardiac rehab.   -Plan to follow closely in the HF clinic.  2  HTN  Increase meds  As above. She BP cuff at home.  3  DM  Needs diet education  Poor control 4 OB  Stopping Breastfeeding 2 days a ago. She understands she prevent pregnancy.    5. CAP Day 5/7 - Rocephin/Zithromaxx. WBC coming down. CXR ok. .     LOS: 3 days     Amy Clegg NP-C  01/31/2016, 9:22 AM   Addendum- After 1 dose of bidil SBP dropped to 91/46 and she complained of dizziness. In part due to over diuresis.  For now stop bidil and entresto. Give 500 cc NS. Cut back carvedilol to 6.25 mg twice a day.  Darrick Grinder NP-C 4:59 PM  Patient seen and examined with Darrick Grinder, NP. We discussed all aspects of the encounter. I agree with the assessment and plan as stated above.   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 She remains tachycardic on exam and BP quite low likely due in part to overdiuresis. We gave 500cc NS with improvement in symptoms. She appears very tenuous. Will move to 3W for closer monitoring. May need PICC line to assess CVP and co-ox. Will continue to adjust meds as above. May need to consider ivabradine. HF team will follow closely.   At her request, I called her Godmother (and HCPOA), Asencion Partridge and discussed the case.   Marikay Roads,MD 7:12 PM

## 2016-01-31 NOTE — Progress Notes (Signed)
CARDIAC REHAB PHASE I   PRE:  Rate/Rhythm: 119 ST    BP: sitting in bed 102/65, sitting EOB 103/68, standing 105/57    SaO2: 96 RA  MODE:  Ambulation: 100 ft   POST:  Rate/Rhythm: 126 ST    BP: sitting 108/58     SaO2: 96 RA  Pt able to slowly get up and ambulate, assist x1. Pt nervous due to recent low BP and syncope however BP and HR response appropriate. C/o slight lightheadedness at 50 ft so turned around and to recliner. Pt happy to get out of bed. Family present. See orthostatics above. RN aware. Will f/u. She has HF book. [place], ACSM 01/31/2016 3:18 PM

## 2016-01-31 NOTE — Plan of Care (Signed)
Problem: Food- and Nutrition-Related Knowledge Deficit (NB-1.1) Goal: Nutrition education Formal process to instruct or train a patient/client in a skill or to impart knowledge to help patients/clients voluntarily manage or modify food choices and eating behavior to maintain or improve health. Outcome: Adequate for Discharge  RD consulted for nutrition education regarding diabetes and low sodium diet.     Lab Results  Component Value Date    HGBA1C 8.6* 01/27/2016   Spoke with pt at bedside. She reports she was diagnosed with DM 5 years ago, 6 months after the birth of her 28 year old son (which she experienced gestational DM during pregnancy). She reports good control prior to the birth of her second son (now 28 months old). PTA, she reports she was on sliding scale insulin and long acting insulin. She reports that she has a sweet tooth, but tries to consume sweets in moderation. Most recently, she reports she started meal prepping, which helped her control her blood sugars, but stopped due to lack of time. Pt with good understanding of DM diet and was able to teach back principles with this RD. She reports cardiac rehab recently saw her and discussed sodium and fluid restriction, but would like more information about how to accomplish this.  RD provided "Carbohydrate Counting for People with Diabetes" handout from the Academy of [employer] and [employer]etics. Discussed different food groups and their effects on blood sugar, emphasizing carbohydrate-containing foods. Provided list of carbohydrates and recommended serving sizes of common foods.  Discussed importance of controlled and consistent carbohydrate intake throughout the day. Provided examples of ways to balance meals/snacks and encouraged intake of high-fiber, whole grain complex carbohydrates.  RD provided "Low Sodium Nutrition Therapy" handout from the Academy of [employer] and Dietetics. Reviewed patient's dietary recall. Provided examples on  ways to decrease sodium intake in diet. Discouraged intake of processed foods and use of salt shaker. Encouraged fresh fruits and vegetables as well as whole grain sources of carbohydrates to maximize fiber intake.   RD discussed why it is important for patient to adhere to diet recommendations, and emphasized the role of fluids and foods to avoid Teach back method used.  Expect fair to good compliance.  Body mass index is 30.92 kg/(m^2). Pt meets criteria for obesity, class I based on current BMI.  Current diet order is low sodium, patient is consuming approximately 45-100% of meals at this time. Labs and medications reviewed. No further nutrition interventions warranted at this time. RD contact information provided. If additional nutrition issues arise, please re-consult RD.  Glora Hulgan A. Jimmye Norman, RD, LDN, CDE Pager: [phone] After hours Pager: [phone]

## 2016-02-01 DIAGNOSIS — I5021 Acute systolic (congestive) heart failure: Secondary | ICD-10-CM

## 2016-02-01 LAB — CBC
HEMATOCRIT: 39.1 % (ref 36.0–46.0)
Hemoglobin: 12.4 g/dL (ref 12.0–15.0)
MCH: 25.7 pg — ABNORMAL LOW (ref 26.0–34.0)
MCHC: 31.7 g/dL (ref 30.0–36.0)
MCV: 81 fL (ref 78.0–100.0)
Platelets: 396 10*3/uL (ref 150–400)
RBC: 4.83 MIL/uL (ref 3.87–5.11)
RDW: 13.5 % (ref 11.5–15.5)
WBC: 11.7 10*3/uL — ABNORMAL HIGH (ref 4.0–10.5)

## 2016-02-01 LAB — BASIC METABOLIC PANEL
Anion gap: 13 (ref 5–15)
BUN: 11 mg/dL (ref 6–20)
CALCIUM: 9.3 mg/dL (ref 8.9–10.3)
CO2: 23 mmol/L (ref 22–32)
CREATININE: 0.67 mg/dL (ref 0.44–1.00)
Chloride: 103 mmol/L (ref 101–111)
GFR calc non Af Amer: >60 mL/min (ref >60)
Glucose, Bld: 126 mg/dL — ABNORMAL HIGH (ref 65–99)
Potassium: 3.5 mmol/L (ref 3.5–5.1)
SODIUM: 139 mmol/L (ref 135–145)

## 2016-02-01 LAB — CULTURE, BLOOD (ROUTINE X 2)
Culture: NO GROWTH
Culture: NO GROWTH

## 2016-02-01 LAB — GLUCOSE, CAPILLARY
GLUCOSE-CAPILLARY: 188 mg/dL — AB (ref 65–99)
Glucose-Capillary: 186 mg/dL — ABNORMAL HIGH (ref 65–99)
Glucose-Capillary: 103 mg/dL — ABNORMAL HIGH (ref 65–99)
Glucose-Capillary: 142 mg/dL — ABNORMAL HIGH (ref 65–99)
Glucose-Capillary: 190 mg/dL — ABNORMAL HIGH (ref 65–99)

## 2016-02-01 MED ORDER — ISOSORBIDE MONONITRATE ER 30 MG PO TB24: 15.0000 mg | ORAL_TABLET | Freq: Every day | ORAL | Status: DC

## 2016-02-01 MED ORDER — HYDRALAZINE HCL 10 MG PO TABS: 10.0000 mg | ORAL_TABLET | Freq: Three times a day (TID) | ORAL | Status: DC

## 2016-02-01 MED ORDER — SACUBITRIL-VALSARTAN 24-26 MG PO TABS
1.0000 | ORAL_TABLET | Freq: Once | ORAL | Status: AC
  Administered 2016-02-01: 1 via ORAL
  Filled 2016-02-01: qty 1

## 2016-02-01 MED ORDER — ISOSORB DINITRATE-HYDRALAZINE 20-37.5 MG PO TABS: 0.5000 | ORAL_TABLET | Freq: Three times a day (TID) | ORAL | Status: DC

## 2016-02-01 MED ORDER — FLUCONAZOLE 100 MG PO TABS
100.0000 mg | ORAL_TABLET | ORAL | Status: DC
  Administered 2016-02-01: 100 mg via ORAL
  Filled 2016-02-01 (×2): qty 1

## 2016-02-01 MED ORDER — DIGOXIN 250 MCG PO TABS
0.2500 mg | ORAL_TABLET | Freq: Every day | ORAL | Status: DC
  Administered 2016-02-01 – 2016-02-02 (×2): 0.25 mg via ORAL
  Filled 2016-02-01 (×2): qty 1

## 2016-02-01 MED ORDER — SACUBITRIL-VALSARTAN 49-51 MG PO TABS
1.0000 | ORAL_TABLET | Freq: Two times a day (BID) | ORAL | Status: DC
  Administered 2016-02-01: 1 via ORAL
  Filled 2016-02-01 (×3): qty 1

## 2016-02-01 NOTE — Progress Notes (Signed)
Attempted to give report to 3W nurse.

## 2016-02-01 NOTE — Progress Notes (Addendum)
CARDIAC REHAB PHASE I   PRE:  Rate/Rhythm: 106 ST    BP: lying 130/87    SaO2:   MODE:  Ambulation: 500 ft   POST:  Rate/Rhythm: 118 ST    BP: sitting 141/102     SaO2:    Pt feeling some stronger today. Able to walk independently. Minor lightheadedness. To recliner after walking. HR more controlled, BP elevated. Discussed HF, low sodium, controlling DM, ex gl, weighing daily and CRPII. Pt voiced understanding. I don't believe pt is appropriate for CRPII at this time however I will give brochure for if she has chronic HF. She is asking if she needs someone to be with her. I encouraged her to have someone to help her with the baby. I set up HF video for her to watch. Will f/u. Encouraged more walking this pm. [place], ACSM 02/01/2016 2:53 PM

## 2016-02-01 NOTE — Progress Notes (Signed)
Patient transferred to 3W11 by wheel chair accompanied by the RN.  All belongings taken with patient; family member at bedside accompanied Patient to new room.  3W RN Otila Kluver welcomed Sara [name] and her family member to the unit and room.

## 2016-02-01 NOTE — Progress Notes (Signed)
Report given to 3W RN, Otila Kluver.

## 2016-02-01 NOTE — Progress Notes (Signed)
Subjective: Yesterday diuretics held and Bidil added. SBP dropped into the 90s. Given 500 cc NS . Coreg was cut back to 6.25 mg twice a day.   Today she is eeling much better. Denies SOB/Orthopnea.    .  Objective:  Scheduled Meds: . azithromycin  500 mg Oral Daily  . benzonatate  200 mg Oral TID  . carvedilol  6.25 mg Oral BID WC  . cefTRIAXone (ROCEPHIN)  IV  1 g Intravenous Q24H  . digoxin  0.25 mg Oral Daily  . enoxaparin (LOVENOX) injection  40 mg Subcutaneous Q24H  . insulin aspart  0-5 Units Subcutaneous QHS  . insulin aspart  0-9 Units Subcutaneous TID WC  . insulin glargine  36 Units Subcutaneous QHS  . loratadine  10 mg Oral Daily  . sacubitril-valsartan  1 tablet Oral BID   Continuous Infusions:  PRN Meds:.butalbital-acetaminophen-caffeine, diphenhydrAMINE, hydrALAZINE, HYDROcodone-homatropine, HYDROmorphone (DILAUDID) injection, ipratropium-albuterol  Filed Vitals:   01/31/16 2332 02/01/16 0114 02/01/16 0411 02/01/16 0743  BP: 117/68 123/88 130/85 137/85  Pulse: 102  103 109  Temp: 98.1 F (36.7 C) 98.5 F (36.9 C) 98.2 F (36.8 C) 98.7 F (37.1 C)  TempSrc: Oral Oral Oral Oral  Resp: 18 18 16 18   Height:      Weight:  171 lb 9.6 oz (77.837 kg)    SpO2: 98% 97% 97% 99%   Weight change: 2 lb 8 oz (1.134 kg)  Intake/Output Summary (Last 24 hours) at 02/01/16 0820 Last data filed at 02/01/16 0114  Gross per 24 hour  Intake    358 ml  Output    850 ml  Net   -492 ml   Lying 145/94 Sitting 145/101 Standing 142/94   General: Alert, awake, oriented x3, in no acute distress. Sitting on the side of the bed.  Neck:  JVP 6-7   Heart: Regular rate and rhythm, Tachy  +s3  Lungs: Clear to auscultation.  No rales or wheezes. Exemities:  No edema.    Neuro: Grossly intact, nonfocal.  EKG: ST- 110s.    Lab Results: Results for orders placed or performed during the hospital encounter of 01/27/16 (from the past 24 hour(s))  Glucose, capillary     Status:  Abnormal   Collection Time: 01/31/16  8:21 AM  Result Value Ref Range   Glucose-Capillary 172 (H) 65 - 99 mg/dL  Glucose, capillary     Status: Abnormal   Collection Time: 01/31/16 11:44 AM  Result Value Ref Range   Glucose-Capillary 272 (H) 65 - 99 mg/dL  Glucose, capillary     Status: Abnormal   Collection Time: 01/31/16  5:17 PM  Result Value Ref Range   Glucose-Capillary 138 (H) 65 - 99 mg/dL   Comment 1 Notify RN    Comment 2 Document in Chart   Glucose, capillary     Status: Abnormal   Collection Time: 01/31/16 10:49 PM  Result Value Ref Range   Glucose-Capillary 230 (H) 65 - 99 mg/dL  Glucose, capillary     Status: Abnormal   Collection Time: 02/01/16  1:16 AM  Result Value Ref Range   Glucose-Capillary 186 (H) 65 - 99 mg/dL   Comment 1 Notify RN   CBC     Status: Abnormal   Collection Time: 02/01/16  3:50 AM  Result Value Ref Range   WBC 11.7 (H) 4.0 - 10.5 K/uL   RBC 4.83 3.87 - 5.11 MIL/uL   Hemoglobin 12.4 12.0 - 15.0 g/dL   HCT 39.1  36.0 - 46.0 %   MCV 81.0 78.0 - 100.0 fL   MCH 25.7 (L) 26.0 - 34.0 pg   MCHC 31.7 30.0 - 36.0 g/dL   RDW 13.5 11.5 - 15.5 %   Platelets 396 150 - 400 K/uL  Basic metabolic panel     Status: Abnormal   Collection Time: 02/01/16  3:50 AM  Result Value Ref Range   Sodium 139 135 - 145 mmol/L   Potassium 3.5 3.5 - 5.1 mmol/L   Chloride 103 101 - 111 mmol/L   CO2 23 22 - 32 mmol/L   Glucose, Bld 126 (H) 65 - 99 mg/dL   BUN 11 6 - 20 mg/dL   Creatinine, Ser 0.67 0.44 - 1.00 mg/dL   Calcium 9.3 8.9 - 10.3 mg/dL   GFR calc non Af Amer >60 >60 mL/min   GFR calc Af Amer >60 >60 mL/min   Anion gap 13 5 - 15  Glucose, capillary     Status: Abnormal   Collection Time: 02/01/16  7:41 AM  Result Value Ref Range   Glucose-Capillary 103 (H) 65 - 99 mg/dL    Studies/Results: No results found.  Medications: Reviewed    1  Acute systolic CHF- ECHO EF [id number].  - ? Viral Cardiomyopathy. Also pre-eclampsia with both pregnancies.  Will need  CMRI. Had cold like symptoms about a month ago. Also her kids had flu. Stopped breast feeding a couple days ago. NYHA III. Volume status low. Holding diuretics for now. Orthostatics negative.  Continue Coreg 6.25 mg twice a day. Continue dig 0.25 mg daily.   - Increase Entresto 49-51  mg twice a day . BMET in am  -Planned to add hydralazine 10 mg tid and 15 mg imdur daily but she refused.   - Cardiac Rehab appreciated.  -Plan to follow closely in the HF clinic.  2  HTN - Add hydralazine and imdur at low doses as above. She has a BP cuff at home.   3  DM  Needs diet education  Poor control 4 OB  Stopping Breastfeeding 3 days a ago. She understands she needs to prevent pregnancy.    5. CAP Day 6/7 - Rocephin/Zithromaxx. WBC coming down. CXR ok. .    Continue cardiac rehab.    LOS: 4 days     Amy Clegg NP-C  02/01/2016, 8:20 AM   Patient seen and examined with Darrick Grinder, NP. We discussed all aspects of the encounter. I agree with the assessment and plan as stated above.   Much improved today. Currently refusing Bidil. Will increase Entresto to 49/51. Will hold lasix for now given hypotension yesterday. Consider adding spironolactone. She remains tachycardic. Is on digoxin. May benefit from low dose ivabradine. Ambulate today. Home 24-48 hours. Can consider cMRI as outpatient.   Israel Wunder,MD 9:56 AM

## 2016-02-01 NOTE — Progress Notes (Signed)
The client had the CHG wipes completed on transfer from [place] to stepdown care. MRSA swab was refused.

## 2016-02-01 NOTE — Progress Notes (Signed)
Patient ID: Sara [name], female   DOB: January 10, 1986, 30 y.o.   MRN: [id number]  [facility] PROGRESS NOTE  Sara [name] [id number] DOB: 13-Jul-1986 DOA: 01/27/2016 PCP: Lennie Odor, PA-C   Brief narrative:    30 year old female patient with recent vaginal delivery June 2016 currently breast-feeding, pregnancy was complicated by preeclampsia, diabetes on Lantus and prior hypertension several years ago currently not on medication. Over the past several days pt had increasing paroxysmal nocturnal dyspnea, no fevers or chills, no similar events in the past.   In ED, CT angiogram of the chest with PE protocol: with no pulmonary embolus; noted with widespread alveolar opacity bilaterally R > L, ? congestive heart failure, pneumonia or pulmonary hemorrhage  Major events since admission: 4/3 - transfer pt to step down unit due to low BP  4/4 - better this AM  Assessment/Plan:    Principal Problem:   Hypotension and dizziness 4/3 - improved and SBP now in 130's - currently on Coreg 6.25 mg PO BID and entresto BID - lasix on hold    CAP (community acquired pneumonia) - continue with Rocephin and Zithromax day #6/7 - currently no need for oxygen via [place] - provide antitussives as needed  - WBC trending down from 18 --> 15 --> 11 this AM - CBC in AM  Active Problems: Acute on chronic systolic CHF, EF [id number] - Echo on 3/31 LV noted to be dilated LVEF Mild LVH LVEF 35% RVEF normlal - follow up on cardiology recommendations  - lasix on hold per cardiology     Diabetes mellitus with insulin therapy ([place]) - continue home regimen with insulin  - also on SSI     History of migraine headaches - with aura - continue to provide analgesia as needed  - better this AM    Morbid obesity with BMI 36.0-36.9,adult - Body mass index is 37.09 kg/(m^2) - pt meets criteria for morbid obesity due to BMI > 35 and underlying HTN and DM     Hypokalemia - mild, supplemented - BMP In  AM    Thrombocytopenia - reactive, resolved     Hypertensive emergency / tachycardia on admission  - with sudden hypotension on 4/3, please see above     Breastfeeding (infant)  DVT prophylaxis - Lovenox SQ  Code Status: Full.  Family Communication:  plan of care discussed with the patient Disposition Plan: possible d/c 4/5 or 4/6  IV access:  Peripheral IV  Procedures and diagnostic studies:    Ct Angio Chest Pe W/cm &/or Wo Cm 01/27/2016  No demonstrable pulmonary embolus. Widespread alveolar opacity bilaterally, somewhat more severe on the right than on the left. Relative peripheral sparing is noted bilaterally. The appearance is consistent with widespread alveolar edema, either due to congestive heart failure or noncardiogenic etiology. Given pregnancy and history of hypertension, preeclampsia as a cause of widespread edema must be of concern. Differential considerations must include widespread infectious pneumonia, diffuse aspiration, or pulmonary hemorrhage. Reflux of contrast into the inferior vena cava and hepatic veins may be indicative of a degree of increased right heart pressure. No adenopathy evident.  Medical Consultants:  Cardiology   Other Consultants:  None  IAnti-Infectives:   Zithromax 3/30 --> 4/5 Rocephin 3/30 --> 4/5  Faye Ramsay, MD  [facility] Pager [phone]  If 7PM-7AM, please contact night-coverage [email or web address] Password [email or web address] 02/01/2016, 6:02 PM  HPI/Subjective: No events overnight. Reports she feels better, no dyspnea or chest pain.   Objective: Filed Vitals:   02/01/16 [id number]  02/01/16 1012 02/01/16 1157 02/01/16 1641  BP: 137/85 128/86 133/90 133/90  Pulse: 109  103 107  Temp: 98.7 F (37.1 C)  98.5 F (36.9 C) 98.2 F (36.8 C)  TempSrc: Oral  Oral Oral  Resp: 18  18 18   Height:      Weight:      SpO2: 99%  100% 100%    Intake/Output Summary (Last 24 hours) at 02/01/16 1802 Last data filed at 02/01/16 0114  Gross per 24 hour   Intake    120 ml  Output      0 ml  Net    120 ml    Exam:   General:  Pt is alert, follows commands appropriately, not in acute distress  Cardiovascular: Regular rhythm, tachycardic, no rubs, no gallops  Respiratory: bibasilar crackles, noted but no wheezing  Abdomen: Soft, non tender, non distended, bowel sounds present, no guarding  Extremities: pulses DP and PT palpable bilaterally  Neuro: Grossly nonfocal  Data Reviewed: Basic Metabolic Panel:  Recent Labs Lab 01/28/16 0601 01/29/16 0557 01/30/16 0801 01/31/16 0535 02/01/16 0350  NA 141 138 136 138 139  K 3.7 4.1 3.7 3.4* 3.5  CL 110 106 103 100* 103  CO2 20* 20* 22 23 23   GLUCOSE 87 127* 125* 117* 126*  BUN 9 14 11 13 11   CREATININE 0.73 0.83 0.75 0.79 0.67  CALCIUM 9.1 9.4 9.6 9.5 9.3   CBC:  Recent Labs Lab 01/27/16 1116 01/28/16 0601 01/29/16 0557 01/30/16 0801 01/31/16 0535 02/01/16 0350  WBC 15.4* 16.3* 16.8* 18.6* 15.1* 11.7*  NEUTROABS 12.2*  --   --   --   --   --   HGB 12.2 11.8* 11.9* 13.8 14.6 12.4  HCT 38.4 36.0 37.6 42.7 45.4 39.1  MCV 80.2 80.9 80.2 79.8 80.2 81.0  PLT 385 352 417* 439* 413* 396   CBG:  Recent Labs Lab 01/31/16 2249 02/01/16 0116 02/01/16 0741 02/01/16 1155 02/01/16 1640  GLUCAP 230* 186* 103* 142* 190*    Recent Results (from the past 240 hour(s))  Culture, blood (routine x 2)     Status: None   Collection Time: 01/27/16  2:32 PM  Result Value Ref Range Status   Specimen Description BLOOD RIGHT ANTECUBITAL  Final   Special Requests BOTTLES DRAWN AEROBIC AND ANAEROBIC 5CC  Final   Culture NO GROWTH 5 DAYS  Final   Report Status 02/01/2016 FINAL  Final  Culture, blood (routine x 2)     Status: None   Collection Time: 01/27/16  2:35 PM  Result Value Ref Range Status   Specimen Description BLOOD RIGHT HAND  Final   Special Requests BOTTLES DRAWN AEROBIC ONLY 5CC  Final   Culture NO GROWTH 5 DAYS  Final   Report Status 02/01/2016 FINAL  Final      MEDS SCHEDULED: . azithromycin  500 mg Oral Daily  . benzonatate  200 mg Oral TID  . carvedilol  6.25 mg Oral BID WC  . cefTRIAXone (ROCEPHIN)  IV  1 g Intravenous Q24H  . digoxin  0.25 mg Oral Daily  . enoxaparin (LOVENOX) injection  40 mg Subcutaneous Q24H  . fluconazole  100 mg Oral Q24H  . insulin aspart  0-5 Units Subcutaneous QHS  . insulin aspart  0-9 Units Subcutaneous TID WC  . insulin glargine  36 Units Subcutaneous QHS  . loratadine  10 mg Oral Daily  . sacubitril-valsartan  1 tablet Oral BID

## 2016-02-02 LAB — BASIC METABOLIC PANEL
Anion gap: 11 (ref 5–15)
BUN: 8 mg/dL (ref 6–20)
CHLORIDE: 106 mmol/L (ref 101–111)
CO2: 25 mmol/L (ref 22–32)
CREATININE: 0.64 mg/dL (ref 0.44–1.00)
Calcium: 9.4 mg/dL (ref 8.9–10.3)
GFR calc Af Amer: >60 mL/min (ref >60)
GFR calc non Af Amer: >60 mL/min (ref >60)
Glucose, Bld: 106 mg/dL — ABNORMAL HIGH (ref 65–99)
Potassium: 4 mmol/L (ref 3.5–5.1)
SODIUM: 142 mmol/L (ref 135–145)

## 2016-02-02 LAB — CBC
HEMATOCRIT: 40.6 % (ref 36.0–46.0)
HEMOGLOBIN: 13 g/dL (ref 12.0–15.0)
MCH: 26.2 pg (ref 26.0–34.0)
MCHC: 32 g/dL (ref 30.0–36.0)
MCV: 81.7 fL (ref 78.0–100.0)
Platelets: 385 10*3/uL (ref 150–400)
RBC: 4.97 MIL/uL (ref 3.87–5.11)
RDW: 13.6 % (ref 11.5–15.5)
WBC: 8.6 10*3/uL (ref 4.0–10.5)

## 2016-02-02 LAB — GLUCOSE, CAPILLARY
Glucose-Capillary: 97 mg/dL (ref 65–99)
Glucose-Capillary: 154 mg/dL — ABNORMAL HIGH (ref 65–99)

## 2016-02-02 MED ORDER — BUTALBITAL-APAP-CAFFEINE 50-325-40 MG PO TABS: 1.0000 | ORAL_TABLET | ORAL | Status: AC | PRN

## 2016-02-02 MED ORDER — SACUBITRIL-VALSARTAN 97-103 MG PO TABS
1.0000 | ORAL_TABLET | Freq: Two times a day (BID) | ORAL | Status: DC
  Administered 2016-02-02: 1 via ORAL
  Filled 2016-02-02: qty 1

## 2016-02-02 MED ORDER — SACUBITRIL-VALSARTAN 97-103 MG PO TABS: 1.0000 | ORAL_TABLET | Freq: Two times a day (BID) | ORAL | Status: DC

## 2016-02-02 MED ORDER — FLUCONAZOLE 100 MG PO TABS: 100.0000 mg | ORAL_TABLET | ORAL | Status: DC

## 2016-02-02 MED ORDER — INSULIN GLARGINE 100 UNIT/ML ~~LOC~~ SOLN: 20.0000 [IU] | Freq: Every day | SUBCUTANEOUS | Status: DC

## 2016-02-02 MED ORDER — BENZONATATE 200 MG PO CAPS: 200.0000 mg | ORAL_CAPSULE | Freq: Three times a day (TID) | ORAL | Status: DC

## 2016-02-02 MED ORDER — CARVEDILOL 6.25 MG PO TABS: 6.2500 mg | ORAL_TABLET | Freq: Two times a day (BID) | ORAL | Status: DC

## 2016-02-02 MED ORDER — POTASSIUM CHLORIDE CRYS ER 20 MEQ PO TBCR
40.0000 meq | EXTENDED_RELEASE_TABLET | Freq: Once | ORAL | Status: AC
  Administered 2016-02-02: 40 meq via ORAL
  Filled 2016-02-02: qty 2

## 2016-02-02 MED ORDER — HYDROCODONE-HOMATROPINE 5-1.5 MG/5ML PO SYRP: 5.0000 mL | ORAL_SOLUTION | ORAL | Status: DC | PRN

## 2016-02-02 MED ORDER — DIGOXIN 250 MCG PO TABS: 0.2500 mg | ORAL_TABLET | Freq: Every day | ORAL | Status: DC

## 2016-02-02 NOTE — Progress Notes (Addendum)
Subjective:   Feeling much better. Denies SOB/Orthopnea. Able to walk halls. HR improved. Wants to go home.    .  Objective:  Scheduled Meds: . azithromycin  500 mg Oral Daily  . benzonatate  200 mg Oral TID  . carvedilol  6.25 mg Oral BID WC  . cefTRIAXone (ROCEPHIN)  IV  1 g Intravenous Q24H  . digoxin  0.25 mg Oral Daily  . enoxaparin (LOVENOX) injection  40 mg Subcutaneous Q24H  . fluconazole  100 mg Oral Q24H  . insulin aspart  0-5 Units Subcutaneous QHS  . insulin aspart  0-9 Units Subcutaneous TID WC  . insulin glargine  36 Units Subcutaneous QHS  . loratadine  10 mg Oral Daily  . sacubitril-valsartan  1 tablet Oral BID   Continuous Infusions:  PRN Meds:.butalbital-acetaminophen-caffeine, diphenhydrAMINE, hydrALAZINE, HYDROcodone-homatropine, HYDROmorphone (DILAUDID) injection, ipratropium-albuterol  Filed Vitals:   02/01/16 1641 02/01/16 1943 02/01/16 2320 02/02/16 0330  BP: 133/90 131/89 133/82 138/94  Pulse: 107 106 99 103  Temp: 98.2 F (36.8 C) 98.7 F (37.1 C) 98.6 F (37 C) 98.2 F (36.8 C)  TempSrc: Oral Oral Oral Oral  Resp: 18 17 17 18   Height:      Weight:    79.606 kg (175 lb 8 oz)  SpO2: 100% 95% 97% 100%   Weight change: 1.769 kg (3 lb 14.4 oz)  Intake/Output Summary (Last 24 hours) at 02/02/16 0643 Last data filed at 02/01/16 2100  Gross per 24 hour  Intake    580 ml  Output      0 ml  Net    580 ml   Lying 145/94 Sitting 145/101 Standing 142/94   General: Alert, awake, oriented x3, in no acute distress. Lying in bed.  Neck:  JVP 6-7   Heart: Regular rate and rhythm, Tachy  +s3  Lungs: Clear to auscultation.  No rales or wheezes. Exemities:  No edema.    Neuro: Grossly intact, nonfocal.  Tele SR 90-110   Lab Results: Results for orders placed or performed during the hospital encounter of 01/27/16 (from the past 24 hour(s))  Glucose, capillary     Status: Abnormal   Collection Time: 02/01/16  7:41 AM  Result Value Ref Range     Glucose-Capillary 103 (H) 65 - 99 mg/dL  Glucose, capillary     Status: Abnormal   Collection Time: 02/01/16 11:55 AM  Result Value Ref Range   Glucose-Capillary 142 (H) 65 - 99 mg/dL  Glucose, capillary     Status: Abnormal   Collection Time: 02/01/16  4:40 PM  Result Value Ref Range   Glucose-Capillary 190 (H) 65 - 99 mg/dL  Glucose, capillary     Status: Abnormal   Collection Time: 02/01/16  8:22 PM  Result Value Ref Range   Glucose-Capillary 188 (H) 65 - 99 mg/dL    Studies/Results: No results found.  Medications: Reviewed    1  Acute systolic CHF- ECHO EF [id number].  - ? Viral Cardiomyopathy. Also pre-eclampsia with both pregnancies 2  HTN -  3  DM  Needs diet education  Poor control 4 OB  Stopping Breastfeeding. She understands she needs to prevent pregnancy.    5. CAP Day 77 - Rocephin/Zithromaxx. WBC coming down. CXR ok. .    Much improved. Can go home today.  Supp K this am.   Would send home on:  Entresto 97/103 bid Carvedilol 6.25 bid Digoxin 0.25 daily  Lasix 40mg /kcl 20 PRN only for weight gain and swelling  F/u next week in HF Clinic.(we will arrange)  Can consider cMRI as outpatient.   Sara Fairbairn,MD 6:43 AM

## 2016-02-02 NOTE — Progress Notes (Signed)
CARDIAC REHAB PHASE I   PRE:  Rate/Rhythm: 105 ST    BP: sitting right 153/99    SaO2:   MODE:  Ambulation: 1000 ft   POST:  Rate/Rhythm: 117 ST    BP: sitting right 144/96 (left 146/104)     SaO2:   Pt ambulated independently. Doubled distance from yesterday. Feels well, no c/o. Reviewed ed with pt and boyfriend. Will need continued reminders to keep her on track.  [id number]   [place], ACSM 02/02/2016 10:34 AM

## 2016-02-02 NOTE — Discharge Summary (Signed)
Physician Discharge Summary  Sara [name] [id number] DOB: [birth year] DOA: 01/27/2016  PCP: Lennie Odor, PA-C  Admit date: 01/27/2016 Discharge date: 02/02/2016  Recommendations for Outpatient Follow-up:  1. Pt will need to follow up with PCP in 1-2 weeks post discharge 2. Pt also has an appointment scheduled to see cardiologist for follow up  3. Please obtain BMP to evaluate electrolytes and kidney function 4. Please also check CBC to evaluate Hg and Hct levels 5. Please note that several medications have been added to pt's regimen per cardiologist recommendations 6. Pt made aware to check BP regularly and to notify MD with any SBP < 90 or > 150's, this was discussed with family as well  7. Pt also made aware that further pregnancies are not recommended with current medical condition 8. Cardiac rehabilitation started and to be continued 9. Pt also advised to cancel any trip for now until her medical condition stabilizes   Discharge Diagnoses:  Principal Problem:   CAP (community acquired pneumonia) Active Problems:   Diabetes mellitus with insulin therapy ([place])   History of migraine headaches - with aura   BMI 36.0-36.9,adult   Hypertensive emergency   Acute respiratory distress ([place])   Breastfeeding (infant)   Abnormal EKG   Acute on chronic systolic CHF (congestive heart failure) ([place])   Essential hypertension  Discharge Condition: Stable  Diet recommendation: Heart healthy diet discussed in details   Brief narrative:    Sara [name] with recent vaginal delivery June 2016 currently breast-feeding, pregnancy was complicated by preeclampsia, diabetes on Lantus and prior hypertension several years ago currently not on medication. Over the past several days pt had increasing paroxysmal nocturnal dyspnea, no fevers or chills, no similar events in the past.   In ED, CT angiogram of the chest with PE protocol: with no pulmonary embolus; noted with widespread  alveolar opacity bilaterally R > L, ? congestive heart failure, pneumonia or pulmonary hemorrhage  Major events since admission: 4/3 - transfer pt to step down unit due to low BP  4/4 - better this AM  Assessment/Plan:    Principal Problem:  Hypotension and dizziness 4/3 - improved and SBP now in 130's - currently on Coreg 6.25 mg PO BID and entresto BID   CAP (community acquired pneumonia) - completed 7 days of ABX - currently no need for oxygen via [place] - provide antitussives as needed  - WBC trending down   Active Problems: Acute on chronic systolic CHF, EF [id number] - Echo on 3/31 LV noted to be dilated LVEF Mild LVH LVEF 35% RVEF normal - lasix on hold per cardiology    Diabetes mellitus with insulin therapy ([place]) - continue home regimen with insulin    History of migraine headaches - with aura - continue to provide analgesia as needed  - better this AM   Morbid obesity with BMI 36.0-36.9,adult - Body mass index is 37.09 kg/(m^2) - pt meets criteria for morbid obesity due to BMI > 35 and underlying HTN and DM    Hypokalemia - mild, supplemented   Thrombocytopenia - reactive, resolved    Hypertensive emergency / tachycardia on admission  - with sudden hypotension on 4/3, please see above    Breastfeeding (infant)   Code Status: Full.  Family Communication: plan of care discussed with the [name] Disposition Plan: home  IV access:  Peripheral IV  Procedures and diagnostic studies:   Ct Angio Chest Pe W/cm &/or Wo Cm 01/27/2016 No demonstrable pulmonary embolus. Widespread alveolar opacity  bilaterally, somewhat more severe on the right than on the left. Relative peripheral sparing is noted bilaterally. The appearance is consistent with widespread alveolar edema, either due to congestive heart failure or noncardiogenic etiology. Given pregnancy and history of hypertension, preeclampsia as a cause of widespread edema must be of concern.  Differential considerations must include widespread infectious pneumonia, diffuse aspiration, or pulmonary hemorrhage. Reflux of contrast into the inferior vena cava and hepatic veins may be indicative of a degree of increased right heart pressure. No adenopathy evident.  Medical Consultants:  Cardiology   Other Consultants:  None  IAnti-Infectives:   Zithromax 3/30 --> 4/5 Rocephin 3/30 --> 4/5  MAGICK-Thanh Pomerleau, Rebecca Eaton, MD [facility] Pager [phone]       Discharge Exam: Filed Vitals:   02/02/16 0834 02/02/16 1141  BP: 153/102 140/97  Pulse: 105 106  Temp:  98.3 F (36.8 C)  Resp:  15   Filed Vitals:   02/02/16 0330 02/02/16 0758 02/02/16 0834 02/02/16 1141  BP: 138/94 153/109 153/102 140/97  Pulse: 103 103 105 106  Temp: 98.2 F (36.8 C) 98 F (36.7 C)  98.3 F (36.8 C)  TempSrc: Oral Oral  Oral  Resp: 18 16  15   Height:      Weight: 79.606 kg (175 lb 8 oz)     SpO2: 100% 100%  100%    General: Pt is alert, follows commands appropriately, not in acute distress Cardiovascular: Regular rate and rhythm, S1/S2 +, no murmurs, no rubs, no gallops Respiratory: Clear to auscultation bilaterally, no wheezing, no crackles, no rhonchi Abdominal: Soft, non tender, non distended, bowel sounds +, no guarding Extremities: no cyanosis, pulses palpable bilaterally DP and PT Neuro: Grossly nonfocal  Discharge Instructions  Discharge Instructions    Diet - low sodium heart healthy    Complete by:  As directed      Increase activity slowly    Complete by:  As directed             Medication List    TAKE these medications        benzonatate 200 MG capsule  Commonly known as:  TESSALON  Take 1 capsule (200 mg total) by mouth 3 (three) times daily.  Notes to [name]:  Take dose at 4pm and 10pm     butalbital-acetaminophen-caffeine 50-325-40 MG tablet  Commonly known as:  FIORICET, ESGIC  Take 1 tablet by mouth every 4 (four) hours as needed for headache.      carvedilol 6.25 MG tablet  Commonly known as:  COREG  Take 1 tablet (6.25 mg total) by mouth 2 (two) times daily with a meal.  Notes to [name]:  Take one dose tonight at dinner     cetirizine 10 MG tablet  Commonly known as:  ZYRTEC  Take 10 mg by mouth daily.     digoxin 0.25 MG tablet  Commonly known as:  LANOXIN  Take 1 tablet (0.25 mg total) by mouth daily.     fluconazole 100 MG tablet  Commonly known as:  DIFLUCAN  Take 1 tablet (100 mg total) by mouth daily.  Notes to [name]:  Take tonight     HYDROcodone-homatropine 5-1.5 MG/5ML syrup  Commonly known as:  HYCODAN  Take 5 mLs by mouth every 4 (four) hours as needed for cough.     insulin glargine 100 UNIT/ML injection  Commonly known as:  LANTUS  Inject 0.2 mLs (20 Units total) into the skin at bedtime.  Notes to [name]:  Take tonight  Insulin Pen Needle 31G X 6 MM Misc  Commonly known as:  1ST CHOICE PEN NEEDLES  Use with insulin pen     sacubitril-valsartan 97-103 MG  Commonly known as:  ENTRESTO  Take 1 tablet by mouth 2 (two) times daily.  Notes to [name]:  Take one dose tonight            Follow-up Information    Follow up with Darrick Grinder, NP.   Specialty:  Cardiology   Why:  [facility]  on the 1st [place] At 9:40   Contact information:   [address] [address] [phone]       Follow up with REDMON,NOELLE, PA-C.   Specialty:  Nurse Practitioner   Contact information:   [address] [place] [address] [phone]        The results of significant diagnostics from this hospitalization (including imaging, microbiology, ancillary and laboratory) are listed below for reference.     Microbiology: Recent Results (from the past 240 hour(s))  Culture, blood (routine x 2)     Status: None   Collection Time: 01/27/16  2:32 PM  Result Value Ref Range Status   Specimen Description BLOOD RIGHT  ANTECUBITAL  Final   Special Requests BOTTLES DRAWN AEROBIC AND ANAEROBIC 5CC  Final   Culture NO GROWTH 5 DAYS  Final   Report Status 02/01/2016 FINAL  Final  Culture, blood (routine x 2)     Status: None   Collection Time: 01/27/16  2:35 PM  Result Value Ref Range Status   Specimen Description BLOOD RIGHT HAND  Final   Special Requests BOTTLES DRAWN AEROBIC ONLY 5CC  Final   Culture NO GROWTH 5 DAYS  Final   Report Status 02/01/2016 FINAL  Final     Labs: Basic Metabolic Panel:  Recent Labs Lab 01/29/16 0557 01/30/16 0801 01/31/16 0535 02/01/16 0350 02/02/16 0626  NA 138 136 138 139 142  K 4.1 3.7 3.4* 3.5 4.0  CL 106 103 100* 103 106  CO2 20* 22 23 23 25   GLUCOSE 127* 125* 117* 126* 106*  BUN 14 11 13 11 8   CREATININE 0.83 0.75 0.79 0.67 0.64  CALCIUM 9.4 9.6 9.5 9.3 9.4   CBC:  Recent Labs Lab 01/27/16 1116  01/29/16 0557 01/30/16 0801 01/31/16 0535 02/01/16 0350 02/02/16 0626  WBC 15.4*  < > 16.8* 18.6* 15.1* 11.7* 8.6  NEUTROABS 12.2*  --   --   --   --   --   --   HGB 12.2  < > 11.9* 13.8 14.6 12.4 13.0  HCT 38.4  < > 37.6 42.7 45.4 39.1 40.6  MCV 80.2  < > 80.2 79.8 80.2 81.0 81.7  PLT 385  < > 417* 439* 413* 396 385  < > = values in this interval not displayed.  BNP (last 3 results)  Recent Labs  01/27/16 1630 01/30/16 0801  BNP 273.8* 201.6*   CBG:  Recent Labs Lab 02/01/16 1155 02/01/16 1640 02/01/16 2022 02/02/16 0800 02/02/16 1140  GLUCAP 142* 190* 188* 97 154*   SIGNED: Time coordinating discharge: 30 minutes  MAGICK-Natalyn Szymanowski, MD  [facility] 02/02/2016, 12:50 PM Pager [phone]  If 7PM-7AM, please contact night-coverage [email or web address] Password [email or web address]

## 2016-02-02 NOTE — Discharge Instructions (Signed)
Cardiomyopathy Cardiomyopathy is a long-term (chronic) disease of the heart muscle (myocardium). Over time, the heart becomes abnormally large, thick, or stiff. This makes it harder for the heart to pump blood and can lead to heart failure. There are several types of cardiomyopathy:  Dilated cardiomyopathy. This type causes the ventricles become large and weak.  Hypertrophic cardiomyopathy. This type causes the heart muscle to thicken.  Restrictive cardiomyopathy. This type causes the heart muscle to become rigid and less elastic.  Ischemic cardiomyopathy. This type involves narrowing arteries that cause the walls of the heart get thinner.  Peripartum cardiomyopathy. This type occurs during pregnancy or shortly after pregnancy. CAUSES The cause of cardiomyopathy is often not known. In some cases, it is passed down (inherited) from a family member who also had cardiomyopathy. The disease may develop as a complication of another medical condition. These conditions can include:  Diabetes.  High blood pressure.  Viral infection of the heart.  Heart attack.  Coronary heart disease. RISK FACTORS You may be more likely to develop cardiomyopathy if you:  Have a family history of cardiomyopathy or other heart problems.  Are overweight or obese.  Use illegal drugs.  Abuse alcohol.  Have diabetes.  Have another disease that can cause cardiomyopathy as a complication. SIGNS AND SYMPTOMS Often, cardiomyopathy has no signs or symptoms. If you do have symptoms, they may include:  Shortness of breath, especially during activity.  Fatigue.  An irregular heartbeat (arrhythmia).  Dizziness, light-headedness, or fainting.  Chest pain.  Swelling in the lower leg or ankle. DIAGNOSIS Your health care provider may suspect cardiomyopathy based on your symptoms and medical history. Your health care provider will also do a physical exam. Other tests done may include:  Blood  tests.  Imaging studies of your heart. These may be done using:  X-rays to check if your heart is enlarged.  Echocardiogram to show the size of your heart and how well it pumps.  MRI.  A test to record the electrical activity of your heart (electrocardiogram or ECG).  A test in which you wear a portable device (event monitor) to record your heart's electrical activity while you go about your day.  A test to monitor your heart's activity while you exercise (stress test).   A procedure to check the blood pressure and blood flow in your heart(cardiac catheterization).  Injection of dye into your arteries before imaging studies are taken (angiogram).  Removal of a sample of heart tissue (biopsy). The sample is examined for problems. TREATMENT Treatment depends on the type of cardiomyopathy you have and the severity of your symptoms. If you are not having any symptoms, you might not need treatment. If you need treatment, it may include:  Lifestyle changes.  Quit smoking, if you smoke.  Maintain a healthy weight. Lose weight if directed by your health care provider.  Eat a healthy diet. Include plenty of fruits, vegetables, and whole grains.  Get regular exercise. Ask your health care provider to suggest some activities that are good for you.  Medicine. You may need to take medicine to:  Lower your blood pressure.  Slow down your heart rate.  Keep your heart beating in a steady rhythm.  Clear excess fluids from your body.  Prevent blood clots.  Surgery. You may need surgery to:  Repair a defect.  Remove thickened tissue.  Implant a device to treat serious heart rhythm problems (implantable cardioverter-defibrillator or ICD).  Replace your heart (heart transplant) if all other treatments  have failed (end stage). HOME CARE INSTRUCTIONS  Take medicines only as directed by your health care provider.  Eat a heart-healthy diet. Work with your health care provider or a  registered dietitian to learn about healthy eating options.  Maintain a healthy weight and stay physically active.  Do not use any tobacco products, including cigarettes, chewing tobacco, or electronic cigarettes. If you need help quitting, ask your health care provider.  Work closely with your health care provider to manage chronic conditions, such as diabetes and high blood pressure.  Limit alcohol intake to no more than one drink per day for nonpregnant women and no more than two drinks per day for men. One drink equals 12 ounces of beer, 5 ounces of wine, or 1 ounces of hard liquor.  Try to get at least 7 hours of sleep each night.  Find ways to manage stress.  Keep all follow-up visits as directed by your health care provider. This is important. SEEK MEDICAL CARE IF:  Your symptoms get worse, even after treatment.  You have new symptoms. SEEK IMMEDIATE MEDICAL CARE IF:  You have severe chest pain.  You have shortness of breath.  You cough up pink, bubbly material.  You have sudden sweating.  You feel nauseous and vomit.  You suddenly become light-headed or dizzy.  You feel your heart beating very fast.  It feels like your heart is skipping beats. These symptoms may represent a serious problem that is an emergency. Do not wait to see if the symptoms will go away. Get medical help right away. Call your local emergency services (911 in the [place] Do not drive yourself to the hospital.   This information is not intended to replace advice given to you by your health care provider. Make sure you discuss any questions you have with your health care provider.   Document Released: 12/29/2004 Document Revised: 11/06/2014 Document Reviewed: 03/26/2014 Elsevier Interactive Patient Education 2016 [place].  Heart Failure Heart failure means your heart has trouble pumping blood. This makes it hard for your body to work well. Heart failure is usually a long-term (chronic)  condition. You must take good care of yourself and follow your doctor's treatment plan. HOME CARE  Take your heart medicine as told by your doctor.  Do not stop taking medicine unless your doctor tells you to.  Do not skip any dose of medicine.  Refill your medicines before they run out.  Take other medicines only as told by your doctor or pharmacist.  Stay active if told by your doctor. The elderly and people with severe heart failure should talk with a doctor about physical activity.  Eat heart-healthy foods. Choose foods that are without trans fat and are low in saturated fat, cholesterol, and salt (sodium). This includes fresh or frozen fruits and vegetables, fish, lean meats, fat-free or low-fat dairy foods, whole grains, and high-fiber foods. Lentils and dried peas and beans (legumes) are also good choices.  Limit salt if told by your doctor.  Cook in a healthy way. Roast, grill, broil, bake, poach, steam, or stir-fry foods.  Limit fluids as told by your doctor.  Weigh yourself every morning. Do this after you pee (urinate) and before you eat breakfast. Write down your weight to give to your doctor.  Take your blood pressure and write it down if your doctor tells you to.  Ask your doctor how to check your pulse. Check your pulse as told.  Lose weight if told by your  doctor.  Stop smoking or chewing tobacco. Do not use gum or patches that help you quit without your doctor's approval.  Schedule and go to doctor visits as told.  Nonpregnant women should have no more than 1 drink a day. Men should have no more than 2 drinks a day. Talk to your doctor about drinking alcohol.  Stop illegal drug use.  Stay current with shots (immunizations).  Manage your health conditions as told by your doctor.  Learn to manage your stress.  Rest when you are tired.  If it is really hot outside:  Avoid intense activities.  Use air conditioning or fans, or get in a cooler  place.  Avoid caffeine and alcohol.  Wear loose-fitting, lightweight, and light-colored clothing.  If it is really cold outside:  Avoid intense activities.  Layer your clothing.  Wear mittens or gloves, a hat, and a scarf when going outside.  Avoid alcohol.  Learn about heart failure and get support as needed.  Get help to maintain or improve your quality of life and your ability to care for yourself as needed. GET HELP IF:   You gain weight quickly.  You are more short of breath than usual.  You cannot do your normal activities.  You tire easily.  You cough more than normal, especially with activity.  You have any or more puffiness (swelling) in areas such as your hands, feet, ankles, or belly (abdomen).  You cannot sleep because it is hard to breathe.  You feel like your heart is beating fast (palpitations).  You get dizzy or light-headed when you stand up. GET HELP RIGHT AWAY IF:   You have trouble breathing.  There is a change in mental status, such as becoming less alert or not being able to focus.  You have chest pain or discomfort.  You faint. MAKE SURE YOU:   Understand these instructions.  Will watch your condition.  Will get help right away if you are not doing well or get worse.   This information is not intended to replace advice given to you by your health care provider. Make sure you discuss any questions you have with your health care provider.   Document Released: 07/25/2008 Document Revised: 11/06/2014 Document Reviewed: 12/02/2012 Elsevier Interactive Patient Education 2016 [employer]   Low-Sodium Eating Plan Sodium raises blood pressure and causes water to be held in the body. Getting less sodium from food will help lower your blood pressure, reduce any swelling, and protect your heart, liver, and kidneys. We get sodium by adding salt (sodium chloride) to food. Most of our sodium comes from canned, boxed, and frozen foods.  Restaurant foods, fast foods, and pizza are also very high in sodium. Even if you take medicine to lower your blood pressure or to reduce fluid in your body, getting less sodium from your food is important. WHAT IS MY PLAN? Most people should limit their sodium intake to 2,300 mg a day. Your health care provider recommends that you limit your sodium intake to __________ a day.  WHAT DO I NEED TO KNOW ABOUT THIS EATING PLAN? For the low-sodium eating plan, you will follow these general guidelines:  Choose foods with a % Daily Value for sodium of less than 5% (as listed on the food label).   Use salt-free seasonings or herbs instead of table salt or sea salt.   Check with your health care provider or pharmacist before using salt substitutes.   Eat fresh foods.  Eat more  vegetables and fruits.  Limit canned vegetables. If you do use them, rinse them well to decrease the sodium.   Limit cheese to 1 oz (28 g) per day.   Eat lower-sodium products, often labeled as "lower sodium" or "no salt added."  Avoid foods that contain monosodium glutamate (MSG). MSG is sometimes added to [place] food and some canned foods.  Check food labels (Nutrition Facts labels) on foods to learn how much sodium is in one serving.  Eat more home-cooked food and less restaurant, buffet, and fast food.  When eating at a restaurant, ask that your food be prepared with less salt, or no salt if possible.  HOW DO I READ FOOD LABELS FOR SODIUM INFORMATION? The Nutrition Facts label lists the amount of sodium in one serving of the food. If you eat more than one serving, you must multiply the listed amount of sodium by the number of servings. Food labels may also identify foods as:  Sodium free--Less than 5 mg in a serving.  Very low sodium--35 mg or less in a serving.  Low sodium--140 mg or less in a serving.  Light in sodium--50% less sodium in a serving. For example, if a food that usually has 300 mg of  sodium is changed to become light in sodium, it will have 150 mg of sodium.  Reduced sodium--25% less sodium in a serving. For example, if a food that usually has 400 mg of sodium is changed to reduced sodium, it will have 300 mg of sodium. WHAT FOODS CAN I EAT? Grains Low-sodium cereals, including oats, puffed wheat and rice, and shredded wheat cereals. Low-sodium crackers. Unsalted rice and pasta. Lower-sodium bread.  Vegetables Frozen or fresh vegetables. Low-sodium or reduced-sodium canned vegetables. Low-sodium or reduced-sodium tomato sauce and paste. Low-sodium or reduced-sodium tomato and vegetable juices.  Fruits Fresh, frozen, and canned fruit. Fruit juice.  Meat and Other Protein Products Low-sodium canned tuna and salmon. Fresh or frozen meat, poultry, seafood, and fish. Lamb. Unsalted nuts. Dried beans, peas, and lentils without added salt. Unsalted canned beans. Homemade soups without salt. Eggs.  Dairy Milk. Soy milk. Ricotta cheese. Low-sodium or reduced-sodium cheeses. Yogurt.  Condiments Fresh and dried herbs and spices. Salt-free seasonings. Onion and garlic powders. Low-sodium varieties of mustard and ketchup. Fresh or refrigerated horseradish. Lemon juice.  Fats and Oils Reduced-sodium salad dressings. Unsalted butter.  Other Unsalted popcorn and pretzels.  The items listed above may not be a complete list of recommended foods or beverages. Contact your dietitian for more options. WHAT FOODS ARE NOT RECOMMENDED? Grains Instant hot cereals. Bread stuffing, pancake, and biscuit mixes. Croutons. Seasoned rice or pasta mixes. Noodle soup cups. Boxed or frozen macaroni and cheese. Self-rising flour. Regular salted crackers. Vegetables Regular canned vegetables. Regular canned tomato sauce and paste. Regular tomato and vegetable juices. Frozen vegetables in sauces. Salted [place] fries. Olives. Angie Fava. Relishes. Sauerkraut. Salsa. Meat and Other Protein  Products Salted, canned, smoked, spiced, or pickled meats, seafood, or fish. Bacon, ham, sausage, hot dogs, corned beef, chipped beef, and packaged luncheon meats. Salt pork. Jerky. Pickled herring. Anchovies, regular canned tuna, and sardines. Salted nuts. Dairy Processed cheese and cheese spreads. Cheese curds. Blue cheese and cottage cheese. Buttermilk.  Condiments Onion and garlic salt, seasoned salt, table salt, and sea salt. Canned and packaged gravies. Worcestershire sauce. Tartar sauce. Barbecue sauce. Teriyaki sauce. Soy sauce, including reduced sodium. Steak sauce. Fish sauce. Oyster sauce. Cocktail sauce. Horseradish that you find on the shelf. Regular ketchup and  mustard. Meat flavorings and tenderizers. Bouillon cubes. Hot sauce. Tabasco sauce. Marinades. Taco seasonings. Relishes. Fats and Oils Regular salad dressings. Salted butter. Margarine. Ghee. Bacon fat.  Other Potato and tortilla chips. Corn chips and puffs. Salted popcorn and pretzels. Canned or dried soups. Pizza. Frozen entrees and pot pies.  The items listed above may not be a complete list of foods and beverages to avoid. Contact your dietitian for more information.   This information is not intended to replace advice given to you by your health care provider. Make sure you discuss any questions you have with your health care provider.   Document Released: 04/07/2002 Document Revised: 11/06/2014 Document Reviewed: 08/20/2013 Elsevier Interactive Patient Education 2016 [employer].  Hypertension Hypertension is another name for high blood pressure. High blood pressure forces your heart to work harder to pump blood. A blood pressure reading has two numbers, which includes a higher number over a lower number (example: 110/72). HOME CARE   Have your blood pressure rechecked by your doctor.  Only take medicine as told by your doctor. Follow the directions carefully. The medicine does not work as well if you skip  doses. Skipping doses also puts you at risk for problems.  Do not smoke.  Monitor your blood pressure at home as told by your doctor. GET HELP IF:  You think you are having a reaction to the medicine you are taking.  You have repeat headaches or feel dizzy.  You have puffiness (swelling) in your ankles.  You have trouble with your vision. GET HELP RIGHT AWAY IF:   You get a very bad headache and are confused.  You feel weak, numb, or faint.  You get chest or belly (abdominal) pain.  You throw up (vomit).  You cannot breathe very well. MAKE SURE YOU:   Understand these instructions.  Will watch your condition.  Will get help right away if you are not doing well or get worse.   This information is not intended to replace advice given to you by your health care provider. Make sure you discuss any questions you have with your health care provider.   Document Released: 04/03/2008 Document Revised: 10/21/2013 Document Reviewed: 08/08/2013 Elsevier Interactive Patient Education 2016 [employer].   How to Take Your Blood Pressure HOW DO I GET A BLOOD PRESSURE MACHINE?  You can buy an electronic home blood pressure machine at your local pharmacy. Insurance will sometimes cover the cost if you have a prescription.  Ask your doctor what type of machine is best for you. There are different machines for your arm and your wrist.  If you decide to buy a machine to check your blood pressure on your arm, first check the size of your arm so you can buy the right size cuff. To check the size of your arm:   Use a measuring tape that shows both inches and centimeters.   Wrap the measuring tape around the upper-middle part of your arm. You may need someone to help you measure.   Write down your arm measurement in both inches and centimeters.   To measure your blood pressure correctly, it is important to have the right size cuff.   If your arm is up to 13 inches (up to 34  centimeters), get an adult cuff size.  If your arm is 13 to 17 inches (35 to 44 centimeters), get a large adult cuff size.    If your arm is 17 to 20 inches (45 to 52  centimeters), get an adult thigh cuff.  WHAT DO THE NUMBERS MEAN?   There are two numbers that make up your blood pressure. For example: 120/80.  The first number (120 in our example) is called the "systolic pressure." It is a measure of the pressure in your blood vessels when your heart is pumping blood.  The second number (80 in our example) is called the "diastolic pressure." It is a measure of the pressure in your blood vessels when your heart is resting between beats.  Your doctor will tell you what your blood pressure should be. WHAT SHOULD I DO BEFORE I CHECK MY BLOOD PRESSURE?   Try to rest or relax for at least 30 minutes before you check your blood pressure.  Do not smoke.  Do not have any drinks with caffeine, such as:  Soda.  Coffee.  Tea.  Check your blood pressure in a quiet room.  Sit down and stretch out your arm on a table. Keep your arm at about the level of your heart. Let your arm relax.  Make sure that your legs are not crossed. HOW DO I CHECK MY BLOOD PRESSURE?  Follow the directions that came with your machine.  Make sure you remove any tight-fitting clothing from your arm or wrist. Wrap the cuff around your upper arm or wrist. You should be able to fit a finger between the cuff and your arm. If you cannot fit a finger between the cuff and your arm, it is too tight and should be removed and rewrapped.  Some units require you to manually pump up the arm cuff.  Automatic units inflate the cuff when you press a button.  Cuff deflation is automatic in both models.  After the cuff is inflated, the unit measures your blood pressure and pulse. The readings are shown on a monitor. Hold still and breathe normally while the cuff is inflated.  Getting a reading takes less than a  minute.  Some models store readings in a memory. Some provide a printout of readings. If your machine does not store your readings, keep a written record.  Take readings with you to your next visit with your doctor.   This information is not intended to replace advice given to you by your health care provider. Make sure you discuss any questions you have with your health care provider.   Document Released: 09/28/2008 Document Revised: 11/06/2014 Document Reviewed: 12/11/2013 Elsevier Interactive Patient Education [employer].

## 2016-02-02 NOTE — Care Management Note (Signed)
Case Management Note  Patient Details  Name: CAYLEEN YOUN MRN: [id number] Date of Birth: 07-27-1986  Subjective/Objective: Pt admitted for CAP- IV antibiotic therapy.  Acute systolic CHF- ECHO EF [id number]. Pt will be d/c home on Entresto. Pt is being [facility]).                    Action/Plan: [facility] will assist pt with tele monitoring/ scale once d/c. Order form placed on chart for HF team to fill out. CM will provide pt with [facility] Card for [place]. Pt uses [facility] and medication is available. Co pay $3.00. Pt has HF packet in the room. No further needs from CM at this time.    Expected Discharge Date:                  Expected Discharge Plan:  Home/Self Care  In-House Referral:  NA  Discharge planning Services  CM Consult  Post Acute Care Choice:    Choice offered to:     DME Arranged:    DME Agency:     HH Arranged:    HH Agency:     Status of Service:  In process, will continue to follow  Medicare Important Message Given:    Date Medicare IM Given:    Medicare IM give by:    Date Additional Medicare IM Given:    Additional Medicare Important Message give by:     If discussed at [place] of Stay Meetings, dates discussed:    Additional Comments:  Bethena Roys, RN 02/02/2016, 12:32 PM

## 2016-02-02 NOTE — Progress Notes (Signed)
Heart Failure Navigator Consult Note  Presentation: Sara [name] is a 30 yo with hsitory of DM, HTN She is 9 months post partum Delivered second baby by NSVD in June [id number] Complicated by preeclampsia. Post delivery she says she did ok. BP came down  Present to  on 3/30 with PND  Rx initally with ABX IV  Echo yesterday LV mildly dliated LVEF was 35% RVEF normal Note pt was tachycardic during exam   Pt had preeclampsic with first pregancy Reports being treated with lisinopril for awhile after first baby was bornbut this was stopped as BP was OK Had preeclampsia at this 2nd delivery Was not on antihypertensives after.    Past Medical History  Diagnosis Date  . Infected pilonidal cyst   . Anxiety     diagnosed in 2007  . Diabetes mellitus     type 2 - uncontrolled  . Hypertension     benign essential  . Migraine   . Non-reassuring fetal heart rate or rhythm affecting mother     Social History   Social History  . Marital Status: Divorced    Spouse Name: N/A  . Number of Children: 1  . Years of Education: 21   Social History Main Topics  . Smoking status: Never Smoker   . Smokeless tobacco: Never Used  . Alcohol Use: No     Comment: drank 1-2 drinks a week before pregnancy  . Drug Use: No  . Sexual Activity: Yes   Other Topics Concern  . None   Social History Narrative   Lives with boyfriend and child   Is pregnant (20weeks) with second child   1st born is a boy and her second is a boy   Drinks one cup of coffee a day    ECHO:Study Conclusions--01/28/16  - Left ventricle: The cavity size was mildly dilated. Systolic  function was moderately to severely reduced. The estimated  ejection fraction was 35%. Diffuse hypokinesis. The study is not  technically sufficient to allow evaluation of LV diastolic  function. - Ventricular septum: Septal motion showed paradox. - Aortic valve: Trileaflet; normal thickness leaflets. There was  no  regurgitation. - Aortic root: The aortic root was normal in size. - Mitral valve: Structurally normal valve. There was mild  regurgitation. - Right ventricle: The cavity size was normal. Wall thickness was  normal. Systolic function was normal. - Tricuspid valve: There was mild regurgitation. - Pulmonary arteries: Systolic pressure was within the normal  range. - Inferior vena cava: The vessel was normal in size. - Pericardium, extracardiac: There was no pericardial effusion.  Impressions:  - LVEF is moderately to severely decreased estimated at 35%. There  is diffuse hypokinesis and paradoxical septal motion.  LVEF is most probably underestimated sec to tachycardia during  the acquisition.  Transthoracic echocardiography. M-mode, complete 2D, spectral Doppler, and color Doppler. Birthdate: Patient birthdate: Mar 13, 1986. Age: Patient is 30 yr old. Sex: Gender: female. BMI: 37.1 kg/m^2. Blood pressure: 173/115 Patient status: Inpatient. Study date: Study date: 01/28/2016. Study time: 11:58 AM. Location: Bedside.  BNP    Component Value Date/Time   BNP 201.6* 01/30/2016 0801    ProBNP No results found for: PROBNP   Education Assessment and Provision:  Detailed education and instructions provided on heart failure disease management including the following:  Signs and symptoms of Heart Failure When to call the physician Importance of daily weights Low sodium diet Fluid restriction Medication management Anticipated future follow-up appointments  Patient education given on  each of the above topics.  Patient acknowledges understanding and acceptance of all instructions.  I spoke briefly with Ms. Benassi regarding her new HF diagnosis.  She does not have a scale yet has been referred to [facility] for telehealth.  She understands the importance of daily weights and how weight gains relate to the signs and symptoms of HF.  I reviewed a low sodium diet  and high sodium foods to avoid.  She denies any foreseen issues getting or taking prescribed medications and I stressed compliance.  She lives in [place] with her boyfriend.  She will follow in the [facility] Clinic.  Education Materials:  "Living Better With Heart Failure" Booklet, Daily Weight Tracker Tool   High Risk Criteria for Readmission and/or Poor Patient Outcomes:  (Recommend Follow-up with Advanced Heart Failure Clinic)--yes   EF <30%- No 35%  2 or more admissions in 6 months- No  Difficult social situation- No  Demonstrates medication noncompliance- No denies    Barriers of Care:  Knowledge and compliance  Discharge Planning:   Plans to return to [facility] with boyfriend and 2 children.  She has been referred to [facility] for telehealth.

## 2016-02-09 ENCOUNTER — Ambulatory Visit (HOSPITAL_COMMUNITY)
Admit: 2016-02-09 | Discharge: 2016-02-09 | Disposition: A | Discharge status: Home / Self Care | Payer: Medicaid Other | Source: Ambulatory Visit | Attending: Internal Medicine | Admitting: Internal Medicine

## 2016-02-09 ENCOUNTER — Encounter (HOSPITAL_COMMUNITY): Payer: Self-pay

## 2016-02-09 ENCOUNTER — Encounter (HOSPITAL_COMMUNITY): Payer: Self-pay | Admitting: Cardiology

## 2016-02-09 VITALS — BP 114/68 | HR 96 | Wt 169.0 lb

## 2016-02-09 DIAGNOSIS — Z79899 Other long term (current) drug therapy: Secondary | ICD-10-CM | POA: Insufficient documentation

## 2016-02-09 DIAGNOSIS — I10 Essential (primary) hypertension: Secondary | ICD-10-CM | POA: Diagnosis not present

## 2016-02-09 DIAGNOSIS — I5023 Acute on chronic systolic (congestive) heart failure: Secondary | ICD-10-CM

## 2016-02-09 DIAGNOSIS — Z833 Family history of diabetes mellitus: Secondary | ICD-10-CM | POA: Insufficient documentation

## 2016-02-09 DIAGNOSIS — I5022 Chronic systolic (congestive) heart failure: Secondary | ICD-10-CM

## 2016-02-09 DIAGNOSIS — E119 Type 2 diabetes mellitus without complications: Secondary | ICD-10-CM | POA: Insufficient documentation

## 2016-02-09 DIAGNOSIS — Z8701 Personal history of pneumonia (recurrent): Secondary | ICD-10-CM | POA: Diagnosis not present

## 2016-02-09 DIAGNOSIS — Z8249 Family history of ischemic heart disease and other diseases of the circulatory system: Secondary | ICD-10-CM | POA: Diagnosis not present

## 2016-02-09 DIAGNOSIS — Z794 Long term (current) use of insulin: Secondary | ICD-10-CM | POA: Insufficient documentation

## 2016-02-09 LAB — BASIC METABOLIC PANEL
ANION GAP: 10 (ref 5–15)
BUN: 8 mg/dL (ref 6–20)
CALCIUM: 10 mg/dL (ref 8.9–10.3)
CO2: 28 mmol/L (ref 22–32)
Chloride: 101 mmol/L (ref 101–111)
Creatinine, Ser: 0.79 mg/dL (ref 0.44–1.00)
GFR calc Af Amer: >60 mL/min (ref >60)
GLUCOSE: 292 mg/dL — AB (ref 65–99)
POTASSIUM: 4.6 mmol/L (ref 3.5–5.1)
SODIUM: 139 mmol/L (ref 135–145)

## 2016-02-09 NOTE — Patient Instructions (Signed)
Labs today  Your physician recommends that you schedule a follow-up appointment in: 2 weeks In the [facility]   Do the following things EVERYDAY: 1) Weigh yourself in the morning before breakfast. Write it down and keep it in a log. 2) Take your medicines as prescribed 3) Eat low salt foods-Limit salt (sodium) to 2000 mg per day.  4) Stay as active as you can everyday 5) Limit all fluids for the day to less than 2 liters 6)   

## 2016-02-09 NOTE — Progress Notes (Signed)
Patient ID: Sara [name], female   DOB: 10-16-1986, 30 y.o.   MRN: [id number] PCP: Primary Cardiologist: Dr Haroldine Laws  HPI: Sara [name] is a 30 year old with history of DM, HTN, and 2 pregnancies complicated by preeclampsia. She has a 28 year old and 66 month old.    Admitted  March 30th with increased dsypnea and PND.  Prior  to admit she had a virus. CXR was concerning for CAP. Completed antibiotic course. ECHO performed and showed reduced EF -->35%. Diuresed with IV lasix. Instructed to stop breast feeding with addition of HF meds and to prevent pregnancy. Started on entresto, carvedilol and lasix. Discharge weight was 175 pounds.   Today she returns for post hospital follow up. Overall feeling ok.  Complaining of mild fatigue. Mild dyspnea after she walks 8 minutes. She has been walking 8 minutes 2-3 times a day. Denies PND/Orthopnea. Weight at home 165-169 pounds. Not breast feeding. Takes all medications. Not working for now.    SH: Lives with her boyfriend and 2 children.    ROS: All systems negative except as listed in HPI, PMH and Problem List.  SH:  Social History   Social History  . Marital Status: Divorced    Spouse Name: N/A  . Number of Children: 1  . Years of Education: 12   Occupational History  . Not on file.   Social History Main Topics  . Smoking status: Never Smoker   . Smokeless tobacco: Never Used  . Alcohol Use: No     Comment: drank 1-2 drinks a week before pregnancy  . Drug Use: No  . Sexual Activity: Yes   Other Topics Concern  . Not on file   Social History Narrative   Lives with boyfriend and child   Is pregnant (20weeks) with second child   1st born is a boy and her second is a boy   Drinks one cup of coffee a day    FH:  Family History  Problem Relation Age of Onset  . Diabetes Mother   . Diabetes Maternal Grandmother   . Hypertension Father   . Diabetes Paternal Grandmother   . Migraines Mother     Past Medical History  Diagnosis  Date  . Infected pilonidal cyst   . Anxiety     diagnosed in 2007  . Diabetes mellitus     type 2 - uncontrolled  . Hypertension     benign essential  . Migraine   . Non-reassuring fetal heart rate or rhythm affecting mother     Current Outpatient Prescriptions  Medication Sig Dispense Refill  . benzonatate (TESSALON) 200 MG capsule Take 1 capsule (200 mg total) by mouth 3 (three) times daily. 20 capsule 0  . carvedilol (COREG) 6.25 MG tablet Take 1 tablet (6.25 mg total) by mouth 2 (two) times daily with a meal. 60 tablet 0  . cetirizine (ZYRTEC) 10 MG tablet Take 10 mg by mouth daily.    . digoxin (LANOXIN) 0.25 MG tablet Take 1 tablet (0.25 mg total) by mouth daily. 30 tablet 0  . insulin glargine (LANTUS) 100 UNIT/ML injection Inject 0.2 mLs (20 Units total) into the skin at bedtime. (Patient taking differently: Inject 36 Units into the skin at bedtime. ) 10 mL 11  . Insulin Pen Needle (1ST CHOICE PEN NEEDLES) 31G X 6 MM MISC Use with insulin pen 100 each 0  . sacubitril-valsartan (ENTRESTO) 97-103 MG Take 1 tablet by mouth 2 (two) times daily. 60 tablet 0  .  butalbital-acetaminophen-caffeine (FIORICET, ESGIC) 50-325-40 MG tablet Take 1 tablet by mouth every 4 (four) hours as needed for headache. (Patient not taking: Reported on 02/09/2016) 14 tablet 0  . HYDROcodone-homatropine (HYCODAN) 5-1.5 MG/5ML syrup Take 5 mLs by mouth every 4 (four) hours as needed for cough. (Patient not taking: Reported on 02/09/2016) 120 mL 0   No current facility-administered medications for this encounter.    Filed Vitals:   02/09/16 0944  BP: 114/68  Pulse: 96  Weight: 169 lb (76.658 kg)  SpO2: 97%    PHYSICAL EXAM: General:  Well appearing. No resp difficulty HEENT: normal Neck: supple. JVP flat. Carotids 2+ bilaterally; no bruits. No lymphadenopathy or thryomegaly appreciated. Cor: PMI normal. Regular rate & rhythm. No rubs, gallops or murmurs.No S3 Lungs: clear Abdomen: soft, nontender,  nondistended. No hepatosplenomegaly. No bruits or masses. Good bowel sounds. Extremities: no cyanosis, clubbing, rash, no edema Neuro: alert & orientedx3, cranial nerves grossly intact. Moves all 4 extremities w/o difficulty. Affect pleasant.   ASSESSMENT & PLAN: 1. Chronic Systolic CHF- ECHO EF [id number]. - ? Viral Cardiomyopathy versus HTN. Also pre-eclampsia with both pregnancies.  NYHA II-III. Functionally doing a little better. Volume status stable. Currently not on  diuretics.  -Continue Entresto 97/103 bidl -Continue Carvedilol 6.25 bid will not increase with fatigue. Hard to know if this is associated with caring for small children versus HF.  - Continue Digoxin 0.25 daily. Check dig level next visit.  -Check BMET today . If potassium and creatinine ok will add 12.5 mg spiro daily.  _ Plan to Check ECHO 3 months after HF meds opitmized.  2 HTN - Stable.  3 DM-  Managed by PCP.  4 OB- Stopped breast feeding April 1st. Norplant in place.   5. CAP- 12/2015. Completed antibiotics course.    Follow up in 2 weeks. Today I provided a letter that she is to remain out of work for now.    Amy Clegg NP-C  12:03 PM

## 2016-02-14 ENCOUNTER — Telehealth (HOSPITAL_COMMUNITY): Payer: Self-pay | Admitting: Cardiology

## 2016-02-14 DIAGNOSIS — R002 Palpitations: Secondary | ICD-10-CM

## 2016-02-14 NOTE — Telephone Encounter (Signed)
TRIAGE CALL  Patient called with concerns regarding palpitations With recent cardiac issues patient wanted to be sure this was not a CHF sign DENIES- SOB, active chest pain, numbness/HA, edema, weight stable Reports symptoms is very short lived and has happened over the past 3 days  Please advise further

## 2016-02-15 NOTE — Telephone Encounter (Signed)
[facility] manager called and would like someone to follow up with patient about symptoms.  Please see phone note below

## 2016-02-15 NOTE — Telephone Encounter (Signed)
Received weekly report from [facility], wt has been stable 167-168, BP runs 110s-140s over 70-80s, HR in the 90s.  Discussed sx with Dr Aundra Dubin, he recommends pt can have 48 hour holter for palps.  Attempted to call pt and Left message to call back

## 2016-02-15 NOTE — Telephone Encounter (Signed)
Pt aware and agreeable for 48 hour holter, order placed

## 2016-02-17 ENCOUNTER — Ambulatory Visit (INDEPENDENT_AMBULATORY_CARE_PROVIDER_SITE_OTHER): Payer: Medicaid Other

## 2016-02-17 DIAGNOSIS — R002 Palpitations: Secondary | ICD-10-CM | POA: Diagnosis not present

## 2016-02-24 ENCOUNTER — Ambulatory Visit (HOSPITAL_COMMUNITY)
Admission: RE | Admit: 2016-02-24 | Discharge: 2016-02-24 | Disposition: A | Discharge status: Home / Self Care | Payer: Medicaid Other | Source: Ambulatory Visit | Attending: Cardiology | Admitting: Cardiology

## 2016-02-24 VITALS — BP 146/88 | HR 88 | Wt 173.2 lb

## 2016-02-24 DIAGNOSIS — Z794 Long term (current) use of insulin: Secondary | ICD-10-CM | POA: Diagnosis not present

## 2016-02-24 DIAGNOSIS — I5022 Chronic systolic (congestive) heart failure: Secondary | ICD-10-CM

## 2016-02-24 DIAGNOSIS — Z8249 Family history of ischemic heart disease and other diseases of the circulatory system: Secondary | ICD-10-CM | POA: Insufficient documentation

## 2016-02-24 DIAGNOSIS — Z79899 Other long term (current) drug therapy: Secondary | ICD-10-CM | POA: Insufficient documentation

## 2016-02-24 DIAGNOSIS — E119 Type 2 diabetes mellitus without complications: Secondary | ICD-10-CM | POA: Diagnosis not present

## 2016-02-24 DIAGNOSIS — Z833 Family history of diabetes mellitus: Secondary | ICD-10-CM | POA: Diagnosis not present

## 2016-02-24 DIAGNOSIS — I10 Essential (primary) hypertension: Secondary | ICD-10-CM | POA: Diagnosis not present

## 2016-02-24 DIAGNOSIS — Z8701 Personal history of pneumonia (recurrent): Secondary | ICD-10-CM | POA: Diagnosis not present

## 2016-02-24 LAB — DIGOXIN LEVEL: DIGOXIN LVL: 0.8 ng/mL (ref 0.8–2.0)

## 2016-02-24 MED ORDER — CARVEDILOL 3.125 MG PO TABS: 3.1250 mg | ORAL_TABLET | Freq: Two times a day (BID) | ORAL | Status: DC

## 2016-02-24 MED ORDER — ISOSORB DINITRATE-HYDRALAZINE 20-37.5 MG PO TABS: 0.5000 | ORAL_TABLET | Freq: Three times a day (TID) | ORAL | Status: DC

## 2016-02-24 NOTE — Patient Instructions (Signed)
DECREASE Coreg to 3.125 mg one tab twice a day START Bidil one half tab three times a day  Your physician recommends that you schedule a follow-up appointment in: 2 weeks with Darrick Grinder, NP and in 4-6 weeks with Dr. Haroldine Laws  Do the following things EVERYDAY: 1) Weigh yourself in the morning before breakfast. Write it down and keep it in a log. 2) Take your medicines as prescribed 3) Eat low salt foods-Limit salt (sodium) to 2000 mg per day.  4) Stay as active as you can everyday 5) Limit all fluids for the day to less than 2 liters 6)

## 2016-02-24 NOTE — Progress Notes (Signed)
Patient ID: Sara [name], female   DOB: [birth year], 30 y.o.   MRN: [id number] PCP: Primary Cardiologist: Dr Haroldine Laws  HPI: Sara [name] is a 30 year old with history of DM, HTN, and 2 pregnancies complicated by preeclampsia. She has a 28 year old and 34 month old.    Admitted  March 30th with increased dsypnea and PND.  Prior  to admit she had a virus. CXR was concerning for CAP. Completed antibiotic course. ECHO performed and showed reduced EF -->35%. Diuresed with IV lasix. Instructed to stop breast feeding with addition of HF meds and to prevent pregnancy. Started on entresto, carvedilol and lasix. Discharge weight was 175 pounds.   Today she returns for  follow up. Since the last visit she has had palpitations and wore 48 hour monitor with no events. Overall feeling ok.  Complaining of ongoing fatigue.She says she is able to walk around grocery store without difficulty.   Denies PND/Orthopnea. Weight at home 167-170 pounds. Not breast feeding. Takes all medications. Not working for now.    SH: Lives with her boyfriend and 2 children.    ROS: All systems negative except as listed in HPI, PMH and Problem List.  SH:  Social History   Social History  . Marital Status: Divorced    Spouse Name: N/A  . Number of Children: 1  . Years of Education: 12   Occupational History  . Not on file.   Social History Main Topics  . Smoking status: Never Smoker   . Smokeless tobacco: Never Used  . Alcohol Use: No     Comment: drank 1-2 drinks a week before pregnancy  . Drug Use: No  . Sexual Activity: Yes   Other Topics Concern  . Not on file   Social History Narrative   Lives with boyfriend and child   Is pregnant (20weeks) with second child   1st born is a boy and her second is a boy   Drinks one cup of coffee a day    FH:  Family History  Problem Relation Age of Onset  . Diabetes Mother   . Diabetes Maternal Grandmother   . Hypertension Father   . Diabetes Paternal Grandmother    . Migraines Mother     Past Medical History  Diagnosis Date  . Infected pilonidal cyst   . Anxiety     diagnosed in 2007  . Diabetes mellitus     type 2 - uncontrolled  . Hypertension     benign essential  . Migraine   . Non-reassuring fetal heart rate or rhythm affecting mother     Current Outpatient Prescriptions  Medication Sig Dispense Refill  . butalbital-acetaminophen-caffeine (FIORICET, ESGIC) 50-325-40 MG tablet Take 1 tablet by mouth every 4 (four) hours as needed for headache. 14 tablet 0  . carvedilol (COREG) 6.25 MG tablet Take 1 tablet (6.25 mg total) by mouth 2 (two) times daily with a meal. 60 tablet 0  . cetirizine (ZYRTEC) 10 MG tablet Take 10 mg by mouth daily.    . digoxin (LANOXIN) 0.25 MG tablet Take 1 tablet (0.25 mg total) by mouth daily. 30 tablet 0  . insulin aspart (NOVOLOG) 100 UNIT/ML injection Inject into the skin as directed. Sliding scale as directed    . insulin glargine (LANTUS) 100 UNIT/ML injection Inject 40 Units into the skin at bedtime.    . sacubitril-valsartan (ENTRESTO) 97-103 MG Take 1 tablet by mouth 2 (two) times daily. 60 tablet 0  . Insulin Pen  Needle (1ST CHOICE PEN NEEDLES) 31G X 6 MM MISC Use with insulin pen 100 each 0   No current facility-administered medications for this encounter.    Filed Vitals:   02/24/16 1047  BP: 146/88  Pulse: 88  Weight: 173 lb 3.2 oz (78.563 kg)  SpO2: 100%    PHYSICAL EXAM: General:  Well appearing. No resp difficulty. Ambulated in the clinic without difficulty.  HEENT: normal Neck: supple. JVP flat. Carotids 2+ bilaterally; no bruits. No lymphadenopathy or thryomegaly appreciated. Cor: PMI normal. Regular rate & rhythm. No rubs, gallops or murmurs.No S3 Lungs: clear Abdomen: soft, nontender, nondistended. No hepatosplenomegaly. No bruits or masses. Good bowel sounds. Extremities: no cyanosis, clubbing, rash, no edema Neuro: alert & orientedx3, cranial nerves grossly intact. Moves all 4  extremities w/o difficulty. Affect pleasant.   ASSESSMENT & PLAN: 1. Chronic Systolic CHF- 99991111 EF 35%. - ? Viral Cardiomyopathy versus HTN. Also pre-eclampsia with both pregnancies.  NYHA II. Functionally doing a little better despite fatigue.  Volume status stable. Currently not on  diuretics.  -Continue Entresto 97/103 mg twice a day.  -Cut back carvedilol to 3.125 mg twice a day due to fatigue.  -Add 1/2 tab of bidil three times a day.   - Continue Digoxin 0.25 daily. Check dig level  _ Plan to Check ECHO 3 months after HF meds opitmized.  2 HTN - Stable.  3 DM-  Managed by PCP.  4 OB- Stopped breast feeding April 1st. Norplant in place.   5. CAP- 12/2015. Completed antibiotics course.    Follow up in 2 weeks for additional medication titration. Then she will follow up in 4-6 weeks with Dr Haroldine Laws. Amy Clegg NP-C  10:53 AM

## 2016-02-24 NOTE — Progress Notes (Signed)
Advanced Heart Failure Medication Review by a Pharmacist  Does the patient  feel that his/her medications are working for him/her?  yes  Has the patient been experiencing any side effects to the medications prescribed?  Yes, fatigue with carvedilol  Does the patient measure his/her own blood pressure or blood glucose at home?  yes   Does the patient have any problems obtaining medications due to transportation or finances?   no  Understanding of regimen: good Understanding of indications: good Potential of compliance: excellent Patient understands to avoid NSAIDs. Patient understands to avoid decongestants.  Issues to address at subsequent visits: none   Pharmacist comments: Ms Hopgood is a 30 yo woman here for f/u in HF clinic. Reports still feeling tired and feels that it is related more to medications than caring for young son, currently on carvedilol 6.25 mg BID. Did not start spironolactone. Denies any dizziness, lightheadedness, or vision changes. Still has migraine HA and uses Fiorinal prn. On digoxin started prior to discharge and will get level today.  Heloise Ochoa, [place].D., BCPS PGY2 Cardiology Pharmacy Resident Pager: [phone]    Time with patient: 5 min Preparation and documentation time: 5 min Total time: 10 min

## 2016-02-25 ENCOUNTER — Telehealth (HOSPITAL_COMMUNITY): Payer: Self-pay | Admitting: Cardiology

## 2016-02-25 NOTE — Telephone Encounter (Signed)
Patient returned called with additional symptoms C/o decreased b/p 108/58 HR 56, skin feels "clammy", and feels like if she walks around she will fall out Denied-nausea and diarrhea (?dig toxicity), chest pain and increased SOB Advised to hold coreg through out weekend keep a close watch on b/p and HR Return call on Monday with update  If symptoms get worse she should report to ER for further evaluation  Please advise further if needed

## 2016-02-25 NOTE — Telephone Encounter (Signed)
Patient called to report severe HA/migraine after started new mediatation bidil Patient reports she has taken Fioricet and has not experienced any relief  SE reviewed with CHF pharmacist, pt should stop bidil and can discuss other medication options, follow up scheduled 5/10  Pt voiced understanding

## 2016-02-28 ENCOUNTER — Telehealth (HOSPITAL_COMMUNITY): Payer: Self-pay | Admitting: Pharmacist

## 2016-02-28 NOTE — Telephone Encounter (Signed)
Entresto 97/103 mg BID PA approved by [place] Medicaid through 02/11/17.   Ruta Hinds. Velva Harman, PharmD, BCPS, CPP Clinical Pharmacist Pager: [phone] Phone: [phone] 02/28/2016 9:06 AM

## 2016-03-01 DIAGNOSIS — Z794 Long term (current) use of insulin: Secondary | ICD-10-CM | POA: Diagnosis not present

## 2016-03-01 DIAGNOSIS — Z872 Personal history of diseases of the skin and subcutaneous tissue: Secondary | ICD-10-CM | POA: Insufficient documentation

## 2016-03-01 DIAGNOSIS — Z91040 Latex allergy status: Secondary | ICD-10-CM | POA: Diagnosis not present

## 2016-03-01 DIAGNOSIS — E119 Type 2 diabetes mellitus without complications: Secondary | ICD-10-CM | POA: Insufficient documentation

## 2016-03-01 DIAGNOSIS — Z8659 Personal history of other mental and behavioral disorders: Secondary | ICD-10-CM | POA: Diagnosis not present

## 2016-03-01 DIAGNOSIS — Z79899 Other long term (current) drug therapy: Secondary | ICD-10-CM | POA: Diagnosis not present

## 2016-03-01 DIAGNOSIS — I509 Heart failure, unspecified: Secondary | ICD-10-CM | POA: Insufficient documentation

## 2016-03-01 DIAGNOSIS — I10 Essential (primary) hypertension: Secondary | ICD-10-CM | POA: Diagnosis not present

## 2016-03-01 DIAGNOSIS — R079 Chest pain, unspecified: Secondary | ICD-10-CM | POA: Insufficient documentation

## 2016-03-02 ENCOUNTER — Emergency Department (HOSPITAL_COMMUNITY): Payer: Medicaid Other

## 2016-03-02 ENCOUNTER — Emergency Department (HOSPITAL_COMMUNITY)
Admission: EM | Admit: 2016-03-02 | Discharge: 2016-03-02 | Disposition: A | Discharge status: Home / Self Care | Payer: Medicaid Other | Attending: Emergency Medicine | Admitting: Emergency Medicine

## 2016-03-02 ENCOUNTER — Encounter (HOSPITAL_COMMUNITY): Payer: Self-pay | Admitting: Emergency Medicine

## 2016-03-02 DIAGNOSIS — R079 Chest pain, unspecified: Secondary | ICD-10-CM

## 2016-03-02 HISTORY — DX: Heart failure, unspecified: I50.9

## 2016-03-02 LAB — CBC
HCT: 39.6 % (ref 36.0–46.0)
Hemoglobin: 12.3 g/dL (ref 12.0–15.0)
MCH: 25.4 pg — AB (ref 26.0–34.0)
MCHC: 31.1 g/dL (ref 30.0–36.0)
MCV: 81.8 fL (ref 78.0–100.0)
PLATELETS: 327 10*3/uL (ref 150–400)
RBC: 4.84 MIL/uL (ref 3.87–5.11)
RDW: 13.3 % (ref 11.5–15.5)
WBC: 11.8 10*3/uL — AB (ref 4.0–10.5)

## 2016-03-02 LAB — BASIC METABOLIC PANEL
Anion gap: 9 (ref 5–15)
CHLORIDE: 104 mmol/L (ref 101–111)
CO2: 25 mmol/L (ref 22–32)
CREATININE: 0.71 mg/dL (ref 0.44–1.00)
Calcium: 9.7 mg/dL (ref 8.9–10.3)
GFR calc Af Amer: >60 mL/min (ref >60)
GFR calc non Af Amer: >60 mL/min (ref >60)
Glucose, Bld: 213 mg/dL — ABNORMAL HIGH (ref 65–99)
Potassium: 4.3 mmol/L (ref 3.5–5.1)
SODIUM: 138 mmol/L (ref 135–145)

## 2016-03-02 LAB — I-STAT TROPONIN, ED
Troponin i, poc: 0 ng/mL (ref 0.00–0.08)
Troponin i, poc: 0.01 ng/mL (ref 0.00–0.08)

## 2016-03-02 LAB — BRAIN NATRIURETIC PEPTIDE: B Natriuretic Peptide: 53.6 pg/mL (ref 0.0–100.0)

## 2016-03-02 MED ORDER — CLONIDINE HCL 0.1 MG PO TABS
0.1000 mg | ORAL_TABLET | Freq: Once | ORAL | Status: AC
  Administered 2016-03-02: 0.1 mg via ORAL
  Filled 2016-03-02: qty 1

## 2016-03-02 NOTE — ED Notes (Signed)
C/o tightness to L chest x 3-4 hours.  Denies nausea, vomiting, sob, or any other associated symptoms.

## 2016-03-02 NOTE — Discharge Instructions (Signed)
Nonspecific Chest Pain  °Chest pain can be caused by many different conditions. There is always a chance that your pain could be related to something serious, such as a heart attack or a blood clot in your lungs. Chest pain can also be caused by conditions that are not life-threatening. If you have chest pain, it is very important to follow up with your health care provider. °CAUSES  °Chest pain can be caused by: °· Heartburn. °· Pneumonia or bronchitis. °· Anxiety or stress. °· Inflammation around your heart (pericarditis) or lung (pleuritis or pleurisy). °· A blood clot in your lung. °· A collapsed lung (pneumothorax). It can develop suddenly on its own (spontaneous pneumothorax) or from trauma to the chest. °· Shingles infection (varicella-zoster virus). °· Heart attack. °· Damage to the bones, muscles, and cartilage that make up your chest wall. This can include: °¨ Bruised bones due to injury. °¨ Strained muscles or cartilage due to frequent or repeated coughing or overwork. °¨ Fracture to one or more ribs. °¨ Sore cartilage due to inflammation (costochondritis). °RISK FACTORS  °Risk factors for chest pain may include: °· Activities that increase your risk for trauma or injury to your chest. °· Respiratory infections or conditions that cause frequent coughing. °· Medical conditions or overeating that can cause heartburn. °· Heart disease or family history of heart disease. °· Conditions or health behaviors that increase your risk of developing a blood clot. °· Having had chicken pox (varicella zoster). °SIGNS AND SYMPTOMS °Chest pain can feel like: °· Burning or tingling on the surface of your chest or deep in your chest. °· Crushing, pressure, aching, or squeezing pain. °· Dull or sharp pain that is worse when you move, cough, or take a deep breath. °· Pain that is also felt in your back, neck, shoulder, or arm, or pain that spreads to any of these areas. °Your chest pain may come and go, or it may stay  constant. °DIAGNOSIS °Lab tests or other studies may be needed to find the cause of your pain. Your health care provider may have you take a test called an ambulatory ECG (electrocardiogram). An ECG records your heartbeat patterns at the time the test is performed. You may also have other tests, such as: °· Transthoracic echocardiogram (TTE). During echocardiography, sound waves are used to create a picture of all of the heart structures and to look at how blood flows through your heart. °· Transesophageal echocardiogram (TEE). This is a more advanced imaging test that obtains images from inside your body. It allows your health care provider to see your heart in finer detail. °· Cardiac monitoring. This allows your health care provider to monitor your heart rate and rhythm in real time. °· Holter monitor. This is a portable device that records your heartbeat and can help to diagnose abnormal heartbeats. It allows your health care provider to track your heart activity for several days, if needed. °· Stress tests. These can be done through exercise or by taking medicine that makes your heart beat more quickly. °· Blood tests. °· Imaging tests. °TREATMENT  °Your treatment depends on what is causing your chest pain. Treatment may include: °· Medicines. These may include: °¨ Acid blockers for heartburn. °¨ Anti-inflammatory medicine. °¨ Pain medicine for inflammatory conditions. °¨ Antibiotic medicine, if an infection is present. °¨ Medicines to dissolve blood clots. °¨ Medicines to treat coronary artery disease. °· Supportive care for conditions that do not require medicines. This may include: °¨ Resting. °¨ Applying heat   or cold packs to injured areas. °¨ Limiting activities until pain decreases. °HOME CARE INSTRUCTIONS °· If you were prescribed an antibiotic medicine, finish it all even if you start to feel better. °· Avoid any activities that bring on chest pain. °· Do not use any tobacco products, including  cigarettes, chewing tobacco, or electronic cigarettes. If you need help quitting, ask your health care provider. °· Do not drink alcohol. °· Take medicines only as directed by your health care provider. °· Keep all follow-up visits as directed by your health care provider. This is important. This includes any further testing if your chest pain does not go away. °· If heartburn is the cause for your chest pain, you may be told to keep your head raised (elevated) while sleeping. This reduces the chance that acid will go from your stomach into your esophagus. °· Make lifestyle changes as directed by your health care provider. These may include: °¨ Getting regular exercise. Ask your health care provider to suggest some activities that are safe for you. °¨ Eating a heart-healthy diet. A registered dietitian can help you to learn healthy eating options. °¨ Maintaining a healthy weight. °¨ Managing diabetes, if necessary. °¨ Reducing stress. °SEEK MEDICAL CARE IF: °· Your chest pain does not go away after treatment. °· You have a rash with blisters on your chest. °· You have a fever. °SEEK IMMEDIATE MEDICAL CARE IF:  °· Your chest pain is worse. °· You have an increasing cough, or you cough up blood. °· You have severe abdominal pain. °· You have severe weakness. °· You faint. °· You have chills. °· You have sudden, unexplained chest discomfort. °· You have sudden, unexplained discomfort in your arms, back, neck, or jaw. °· You have shortness of breath at any time. °· You suddenly start to sweat, or your skin gets clammy. °· You feel nauseous or you vomit. °· You suddenly feel light-headed or dizzy. °· Your heart begins to beat quickly, or it feels like it is skipping beats. °These symptoms may represent a serious problem that is an emergency. Do not wait to see if the symptoms will go away. Get medical help right away. Call your local emergency services (911 in the [place] Do not drive yourself to the hospital. °  °This  information is not intended to replace advice given to you by your health care provider. Make sure you discuss any questions you have with your health care provider. °  °Document Released: 07/26/2005 Document Revised: 11/06/2014 Document Reviewed: 05/22/2014 °Elsevier Interactive Patient Education ©2016 [employer] ° °

## 2016-03-02 NOTE — ED Provider Notes (Signed)
CSN: [id number]     Arrival date & time 03/01/16  2348 History   First MD Initiated Contact with Patient 03/02/16 0217     Chief Complaint  Patient presents with  . Chest Pain     (Consider location/radiation/quality/duration/timing/severity/associated sxs/prior Treatment) HPI Comments:  30 year old female with a history of diabetes mellitus, hypertension with 2 pregnancies complicated by preeclampsia, and CHF (LVEF 35%) presents to the  ED for evaluation of chest pain and tightness. Patient states that she noticed a discomfort in her left chest 3-4 hours prior to arrival. She states that she was awake and seated when the symptoms occurred. She has had some mild spontaneous improvement of her constant tightness. No medications taken prior to arrival for symptoms. Patient has had no nausea, vomiting, shortness of breath, abdominal pain, syncope, or fever. She denies leg swelling and reports compliance with all of her daily medications. Patient denies any significant weight gain, stating that she weighs approximately 170 pounds. She states that she had follow-up with her cardiologist 1 week ago with a normal checkup.  Patient is a 30 y.o. female presenting with chest pain. The history is provided by the patient. No language interpreter was used.  Chest Pain Associated symptoms: no fever, no nausea, no shortness of breath, not vomiting and no weakness     Past Medical History  Diagnosis Date  . Infected pilonidal cyst   . Anxiety     diagnosed in 2007  . Diabetes mellitus     type 2 - uncontrolled  . Hypertension     benign essential  . Migraine   . Non-reassuring fetal heart rate or rhythm affecting mother   . CHF (congestive heart failure) [facility]    Past Surgical History  Procedure Laterality Date  . No past surgeries     Family History  Problem Relation Age of Onset  . Diabetes Mother   . Diabetes Maternal Grandmother   . Hypertension Father   . Diabetes Paternal Grandmother   .  Migraines Mother    Social History  Substance Use Topics  . Smoking status: Never Smoker   . Smokeless tobacco: Never Used  . Alcohol Use: No     Comment: drank 1-2 drinks a week before pregnancy   OB History    Gravida Para Term Preterm AB TAB SAB Ectopic Multiple Living   3 2  2 1  1   0 1      Review of Systems  Constitutional: Negative for fever.  Respiratory: Positive for chest tightness. Negative for shortness of breath.   Cardiovascular: Positive for chest pain.  Gastrointestinal: Negative for nausea and vomiting.  Neurological: Negative for syncope and weakness.  All other systems reviewed and are negative.   Allergies  Septra ds; Lobster; and Latex  Home Medications   Prior to Admission medications   Medication Sig Start Date End Date Taking? Authorizing Provider  butalbital-acetaminophen-caffeine (FIORICET, ESGIC) [phone] MG tablet Take 1 tablet by mouth every 4 (four) hours as needed for headache. 02/02/16  Yes Theodis Blaze, MD  carvedilol (COREG) 3.125 MG tablet Take 1 tablet (3.125 mg total) by mouth 2 (two) times daily with a meal. 02/24/16  Yes Amy D Clegg, NP  cetirizine (ZYRTEC) 10 MG tablet Take 10 mg by mouth daily.   Yes Historical Provider, MD  digoxin (LANOXIN) 0.25 MG tablet Take 1 tablet (0.25 mg total) by mouth daily. 02/02/16  Yes Theodis Blaze, MD  insulin aspart (NOVOLOG) 100 UNIT/ML injection Inject  10-14 Units into the skin 3 (three) times daily with meals. Sliding scale as directed   Yes Historical Provider, MD  insulin glargine (LANTUS) 100 UNIT/ML injection Inject 40 Units into the skin at bedtime.   Yes Historical Provider, MD  sacubitril-valsartan (ENTRESTO) 97-103 MG Take 1 tablet by mouth 2 (two) times daily. 02/02/16  Yes Theodis Blaze, MD  Insulin Pen Needle (1ST CHOICE PEN NEEDLES) 31G X 6 MM MISC Use with insulin pen 08/08/14   Harden Mo, MD  isosorbide-hydrALAZINE (BIDIL) 20-37.5 MG tablet Take 0.5 tablets by mouth 3 (three) times  daily. Patient not taking: Reported on 03/02/2016 02/24/16   Amy D Clegg, NP   BP 128/87 mmHg  Pulse 79  Temp(Src) 98.6 F (37 C) (Oral)  Resp 17  Ht 5\' 2"  (1.575 m)  Wt 78.019 kg  BMI 31.45 kg/m2  SpO2 100%   Physical Exam  Constitutional: She is oriented to person, place, and time. She appears well-developed and well-nourished. No distress.  Patient nontoxic/nonseptic appearing  HENT:  Head: Normocephalic and atraumatic.  Eyes: Conjunctivae and EOM are normal. No scleral icterus.  Neck: Normal range of motion.  No JVD  Cardiovascular: Normal rate, regular rhythm and intact distal pulses.   Pulmonary/Chest: Effort normal and breath sounds normal. No respiratory distress. She has no wheezes. She has no rales.  Respirations even and unlabored. Chest expansion symmetric.  Abdominal: Soft. She exhibits no distension. There is no tenderness. There is no rebound.  Soft, nontender, nondistended abdomen.  Musculoskeletal: Normal range of motion.  No peripheral edema  Neurological: She is alert and oriented to person, place, and time. She exhibits normal muscle tone. Coordination normal.  Skin: Skin is warm and dry. No rash noted. She is not diaphoretic. No erythema. No pallor.  Psychiatric: She has a normal mood and affect. Her behavior is normal.  Nursing note and vitals reviewed.   ED Course  Procedures (including critical care time) Labs Review Labs Reviewed  BASIC METABOLIC PANEL - Abnormal; Notable for the following:    Glucose, Bld 213 (*)    BUN <5 (*)    All other components within normal limits  CBC - Abnormal; Notable for the following:    WBC 11.8 (*)    MCH 25.4 (*)    All other components within normal limits  BRAIN NATRIURETIC PEPTIDE  I-STAT TROPOININ, ED  Randolm Idol, ED    Imaging Review Dg Chest 2 View  03/02/2016  CLINICAL DATA:  Left chest pain, onset tonight. EXAM: CHEST  2 VIEW COMPARISON:  None. FINDINGS: Mild cardiomegaly, unchanged. The lungs  are clear. The pulmonary vasculature is normal. Hilar and mediastinal contours are unremarkable. There is no pleural effusion. IMPRESSION: Unchanged cardiomegaly.  No acute cardiopulmonary findings. Electronically Signed   By: Andreas Newport M.D.   On: 03/02/2016 00:44   I have personally reviewed and evaluated these images and lab results as part of my medical decision-making.   EKG Interpretation   Date/Time:  Wednesday Mar 01 2016 23:53:40 EDT Ventricular Rate:  75 PR Interval:  126 QRS Duration: 94 QT Interval:  386 QTC Calculation: 431 R Axis:   102 Text Interpretation:  Unusual P axis and short PR, probable junctional  rhythm Rightward axis Septal infarct , age undetermined Abnormal ECG  Confirmed by DELO  MD, DOUGLAS ([id number]) on 03/02/2016 5:12:23 AM      MDM   Final diagnoses:  Chest pain, unspecified chest pain type    30 year old female  with a history of hypertension and CHF presents to the emergency department for evaluation of chest tightness which began 3-4 hours prior to arrival. Patient initially hypertensive. This improved over ED course after patient was given 1 tablet of 0.1 mg clonidine. EKG negative for STEMI. Patient has also had a negative troponin 2. Chest x-ray stable compared to prior imaging. No evidence of acute cardiopulmonary abnormality. Heart score is 2 c/w low risk of acute coronary event.  Patient with no complaints of pain on reassessment. Given reassuring workup and hemodynamic stability, I do not believe further emergent testing is indicated at this time. Patient has been instructed follow-up with her cardiologist on outpatient basis for recheck of symptoms. Return precautions provided at discharge. Patient discharged in satisfactory condition with no unaddressed concerns. Patient seen and evaluated in conjunction with my attending, Dr. Stark Jock.   Filed Vitals:   03/02/16 0500 03/02/16 0515 03/02/16 0530 03/02/16 0545  BP: 140/88 129/87 128/87 128/87   Pulse: 78 79 74 79  Temp:    98.6 F (37 C)  TempSrc:    Oral  Resp: 14 17 18 17   Height:      Weight:      SpO2: 98% 100% 100% 100%     Antonietta Breach, PA-C 03/02/16 [id number]  Veryl Speak, MD 03/02/16 [phone]

## 2016-03-03 ENCOUNTER — Other Ambulatory Visit (HOSPITAL_COMMUNITY): Payer: Self-pay | Admitting: *Deleted

## 2016-03-03 ENCOUNTER — Other Ambulatory Visit: Payer: Self-pay | Admitting: Student

## 2016-03-03 ENCOUNTER — Telehealth (HOSPITAL_COMMUNITY): Payer: Self-pay | Admitting: Vascular Surgery

## 2016-03-03 MED ORDER — DIGOXIN 250 MCG PO TABS: 0.2500 mg | ORAL_TABLET | Freq: Every day | ORAL | Status: DC

## 2016-03-03 MED ORDER — SACUBITRIL-VALSARTAN 97-103 MG PO TABS: 1.0000 | ORAL_TABLET | Freq: Two times a day (BID) | ORAL | Status: DC

## 2016-03-03 NOTE — Telephone Encounter (Signed)
Pt  Needs refill on Entresto and digixon sent to [facility] on [address] ,she does not use the one in the system.. Please advise

## 2016-03-08 ENCOUNTER — Ambulatory Visit (HOSPITAL_COMMUNITY)
Admission: RE | Admit: 2016-03-08 | Discharge: 2016-03-08 | Disposition: A | Discharge status: Home / Self Care | Payer: Medicaid Other | Source: Ambulatory Visit | Attending: Cardiology | Admitting: Cardiology

## 2016-03-08 VITALS — BP 152/94 | HR 92 | Wt 172.2 lb

## 2016-03-08 DIAGNOSIS — I10 Essential (primary) hypertension: Secondary | ICD-10-CM | POA: Diagnosis not present

## 2016-03-08 DIAGNOSIS — Z833 Family history of diabetes mellitus: Secondary | ICD-10-CM | POA: Diagnosis not present

## 2016-03-08 DIAGNOSIS — Z794 Long term (current) use of insulin: Secondary | ICD-10-CM | POA: Diagnosis not present

## 2016-03-08 DIAGNOSIS — I5022 Chronic systolic (congestive) heart failure: Secondary | ICD-10-CM | POA: Insufficient documentation

## 2016-03-08 DIAGNOSIS — I110 Hypertensive heart disease with heart failure: Secondary | ICD-10-CM | POA: Insufficient documentation

## 2016-03-08 DIAGNOSIS — Z8249 Family history of ischemic heart disease and other diseases of the circulatory system: Secondary | ICD-10-CM | POA: Insufficient documentation

## 2016-03-08 DIAGNOSIS — Z79899 Other long term (current) drug therapy: Secondary | ICD-10-CM | POA: Insufficient documentation

## 2016-03-08 DIAGNOSIS — E119 Type 2 diabetes mellitus without complications: Secondary | ICD-10-CM | POA: Diagnosis not present

## 2016-03-08 MED ORDER — SPIRONOLACTONE 25 MG PO TABS: 12.5000 mg | ORAL_TABLET | Freq: Every day | ORAL | Status: DC

## 2016-03-08 NOTE — Patient Instructions (Signed)
START Spironolactone 12.5 mg, one half tab daily  Your physician recommends that you schedule a follow-up appointment in: 2 weeks with Amy Clegg,NP In the [place] following things EVERYDAY: 1) Weigh yourself in the morning before breakfast. Write it down and keep it in a log. 2) Take your medicines as prescribed 3) Eat low salt foods-Limit salt (sodium) to 2000 mg per day.  4) Stay as active as you can everyday 5) Limit all fluids for the day to less than 2 liters 6)

## 2016-03-08 NOTE — Progress Notes (Signed)
Patient ID: Sara [name], female   DOB: [birth year], 30 y.o.   MRN: [id number] PCP: Primary Cardiologist: Dr Haroldine Laws  HPI: Sara [name] is a 30 year old with history of DM, HTN, and 2 pregnancies complicated by preeclampsia. She has a 28 year old and 41 month old.    Admitted  March 30th with increased dsypnea and PND.  Prior  to admit she had a virus. CXR was concerning for CAP. Completed antibiotic course. ECHO performed and showed reduced EF -->35%. Diuresed with IV lasix. Instructed to stop breast feeding with addition of HF meds and to prevent pregnancy. Started on entresto, carvedilol and lasix. Discharge weight was 175 pounds.   Today she returns for  follow up. Last visit bidil was added but she was intloerant due to headache. Ongoing fatigue. Mild SOB with exertion. SOB with steps. Weight at home 170 pounds. SBP 130 -150s. Not breast feeding. Takes all medications. Not working for now.    SH: Lives with her boyfriend and 2 children.    ROS: All systems negative except as listed in HPI, PMH and Problem List.  SH:  Social History   Social History  . Marital Status: Divorced    Spouse Name: N/A  . Number of Children: 1  . Years of Education: 12   Occupational History  . Not on file.   Social History Main Topics  . Smoking status: Never Smoker   . Smokeless tobacco: Never Used  . Alcohol Use: No     Comment: drank 1-2 drinks a week before pregnancy  . Drug Use: No  . Sexual Activity: Yes   Other Topics Concern  . Not on file   Social History Narrative   Lives with boyfriend and child   Is pregnant (20weeks) with second child   1st born is a boy and her second is a boy   Drinks one cup of coffee a day    FH:  Family History  Problem Relation Age of Onset  . Diabetes Mother   . Diabetes Maternal Grandmother   . Hypertension Father   . Diabetes Paternal Grandmother   . Migraines Mother     Past Medical History  Diagnosis Date  . Infected pilonidal cyst   .  Anxiety     diagnosed in 2007  . Diabetes mellitus     type 2 - uncontrolled  . Hypertension     benign essential  . Migraine   . Non-reassuring fetal heart rate or rhythm affecting mother   . CHF (congestive heart failure) ([place])     Current Outpatient Prescriptions  Medication Sig Dispense Refill  . butalbital-acetaminophen-caffeine (FIORICET, ESGIC) 50-325-40 MG tablet Take 1 tablet by mouth every 4 (four) hours as needed for headache. 14 tablet 0  . carvedilol (COREG) 3.125 MG tablet Take 1 tablet (3.125 mg total) by mouth 2 (two) times daily with a meal. 60 tablet 3  . cetirizine (ZYRTEC) 10 MG tablet Take 10 mg by mouth daily.    . digoxin (LANOXIN) 0.25 MG tablet Take 1 tablet (0.25 mg total) by mouth daily. 30 tablet 6  . insulin aspart (NOVOLOG) 100 UNIT/ML injection Inject 10-14 Units into the skin once. Sliding scale as directed with largest meal    . insulin glargine (LANTUS) 100 UNIT/ML injection Inject 40 Units into the skin at bedtime.    . Insulin Pen Needle (1ST CHOICE PEN NEEDLES) 31G X 6 MM MISC Use with insulin pen 100 each 0  . sacubitril-valsartan (ENTRESTO)  97-103 MG Take 1 tablet by mouth 2 (two) times daily. 60 tablet 6   No current facility-administered medications for this encounter.    Filed Vitals:   03/08/16 1448  BP: 152/94  Pulse: 92  Weight: 172 lb 3.2 oz (78.109 kg)  SpO2: 96%    PHYSICAL EXAM: General:  Well appearing. No resp difficulty. Ambulated in the clinic without difficulty.  HEENT: normal Neck: supple. JVP 5-6. Carotids 2+ bilaterally; no bruits. No lymphadenopathy or thryomegaly appreciated. Cor: PMI normal. Regular rate & rhythm. No rubs, gallops or murmurs.No S3 Lungs: clear Abdomen: soft, nontender, nondistended. No hepatosplenomegaly. No bruits or masses. Good bowel sounds. Extremities: no cyanosis, clubbing, rash, no edema Neuro: alert & orientedx3, cranial nerves grossly intact. Moves all 4 extremities w/o difficulty. Affect  pleasant.   ASSESSMENT & PLAN: 1. Chronic Systolic CHF- [phone] EF 35%. - ? Viral Cardiomyopathy versus HTN. Also pre-eclampsia with both pregnancies. Dr Haroldine Laws performed a bedisde ECHO today EF ~35%.  NYHA II. Functionally doing a little worse.  Volume status stable.  - Add 12.5 mg spiro daily.   -Continue Entresto 97/103 mg twice a day.  -Continue carvedilol to 3.125 mg twice a day due to fatigue.  -Intolerant Bidil due to headache.   - Continue Digoxin 0.25 daily. Check dig level  2 HTN - Elevated.  3 DM-  Managed by PCP.  4 OB- Stopped breast feeding April 1st. Norplant in place.   5. CAP- 12/2015. Completed antibiotics course.    Follow up in 2 weeks with BMET . If she is feeling worse will set up CPX.    Atharv Barriere NP-C  2:56 PM

## 2016-03-09 ENCOUNTER — Telehealth (HOSPITAL_COMMUNITY): Payer: Self-pay | Admitting: Vascular Surgery

## 2016-03-09 NOTE — Telephone Encounter (Signed)
Per Dr Haroldine Laws, he does not feel pain is cardiac related, could be muscle pain try tylenol.  Spoke w/pt and advised his recommendations, she is aware and agreeable and states she is feeling better now

## 2016-03-09 NOTE — Telephone Encounter (Signed)
Pt was in ER w/CP 5/4 and work up was normal, she was seen in clinic yesterday by Darrick Grinder, NP and Dr Haroldine Laws and did not mention chest pain.  Will discuss w/Dr Bensimhon and call pt

## 2016-03-09 NOTE — Telephone Encounter (Signed)
Pt called she is having slight chest pain she does not know what to do .Marland Kitchen Please advise

## 2016-03-22 ENCOUNTER — Ambulatory Visit (HOSPITAL_COMMUNITY)
Admission: RE | Admit: 2016-03-22 | Discharge: 2016-03-22 | Disposition: A | Discharge status: Home / Self Care | Payer: Medicaid Other | Source: Ambulatory Visit | Attending: Cardiology | Admitting: Cardiology

## 2016-03-22 ENCOUNTER — Telehealth (HOSPITAL_COMMUNITY): Payer: Self-pay | Admitting: Surgery

## 2016-03-22 VITALS — BP 172/112 | HR 93 | Wt 176.4 lb

## 2016-03-22 DIAGNOSIS — Z8249 Family history of ischemic heart disease and other diseases of the circulatory system: Secondary | ICD-10-CM | POA: Diagnosis not present

## 2016-03-22 DIAGNOSIS — I110 Hypertensive heart disease with heart failure: Secondary | ICD-10-CM | POA: Insufficient documentation

## 2016-03-22 DIAGNOSIS — Z79899 Other long term (current) drug therapy: Secondary | ICD-10-CM | POA: Insufficient documentation

## 2016-03-22 DIAGNOSIS — I5022 Chronic systolic (congestive) heart failure: Secondary | ICD-10-CM

## 2016-03-22 DIAGNOSIS — Z794 Long term (current) use of insulin: Secondary | ICD-10-CM | POA: Insufficient documentation

## 2016-03-22 DIAGNOSIS — I10 Essential (primary) hypertension: Secondary | ICD-10-CM | POA: Diagnosis not present

## 2016-03-22 DIAGNOSIS — E119 Type 2 diabetes mellitus without complications: Secondary | ICD-10-CM | POA: Insufficient documentation

## 2016-03-22 DIAGNOSIS — Z833 Family history of diabetes mellitus: Secondary | ICD-10-CM | POA: Insufficient documentation

## 2016-03-22 DIAGNOSIS — Z8701 Personal history of pneumonia (recurrent): Secondary | ICD-10-CM | POA: Diagnosis not present

## 2016-03-22 LAB — BASIC METABOLIC PANEL
ANION GAP: 7 (ref 5–15)
BUN: 5 mg/dL — ABNORMAL LOW (ref 6–20)
CALCIUM: 9.8 mg/dL (ref 8.9–10.3)
CHLORIDE: 102 mmol/L (ref 101–111)
CO2: 26 mmol/L (ref 22–32)
Creatinine, Ser: 0.77 mg/dL (ref 0.44–1.00)
GFR calc non Af Amer: >60 mL/min (ref >60)
Glucose, Bld: 291 mg/dL — ABNORMAL HIGH (ref 65–99)
Potassium: 3.9 mmol/L (ref 3.5–5.1)
SODIUM: 135 mmol/L (ref 135–145)

## 2016-03-22 MED ORDER — CARVEDILOL 3.125 MG PO TABS: 3.1250 mg | ORAL_TABLET | ORAL | Status: DC

## 2016-03-22 MED ORDER — SPIRONOLACTONE 25 MG PO TABS: 25.0000 mg | ORAL_TABLET | Freq: Every day | ORAL | Status: DC

## 2016-03-22 NOTE — Patient Instructions (Signed)
INCREASE Spironolactone to 25 mg ,one tab daily CHANGE Carvedilol to 3.125 mg (1 tab) in the AM and 6.25 mg (2 tabs) in the PM  Labs today  Your physician recommends that you schedule a follow-up appointment in: 2 weeks with Darrick Grinder, NP Your physician recommends that you schedule a follow-up appointment in: 6 weeks with Dr Haroldine Laws with echocardiogram  Your physician has requested that you have an echocardiogram. Echocardiography is a painless test that uses sound waves to create images of your heart. It provides your doctor with information about the size and shape of your heart and how well your heart's chambers and valves are working. This procedure takes approximately one hour. There are no restrictions for this procedure.

## 2016-03-22 NOTE — Telephone Encounter (Signed)
Received call from Ronalee Belts at [facility] regarding patients prescriptions for both Spironalactone and Entresto.  He was concerned about possible interaction.  Ronalee Belts informed that we are aware that patient is on both medications.

## 2016-03-22 NOTE — Progress Notes (Signed)
Patient ID: Sara [name], female   DOB: 02-02-1986, 30 y.o.   MRN: [id number] PCP: Primary Cardiologist: Dr Haroldine Laws  HPI: Sara [name] is a 30 year old with history of DM, HTN, and 2 pregnancies complicated by preeclampsia. She has a 28 year old and 13 month old.    Admitted  March 30th with increased dsypnea and PND.  Prior  to admit she had a virus. CXR was concerning for CAP. Completed antibiotic course. ECHO performed and showed reduced EF -->35%. Diuresed with IV lasix. Instructed to stop breast feeding with addition of HF meds and to prevent pregnancy. Started on entresto, carvedilol and lasix. Discharge weight was 175 pounds.   Today she returns for  follow up. Last visit 12.5 mg spiro added. Able to do a little more around the house. Able to walk around [employer] a couple of times. Occasionally SOB with steps. Denies PND/Orthopnea.  Weight at home 169-171 pounds. SBP 130 -150s. Not breast feeding. Takes all medications. Not working for now.    Labs 5/4 2017 K 4.3 Creatinine 0.71 BNP 54 Labs 01/27/2016: BNP 274   SH: Lives with her boyfriend and 2 children.   ROS: All systems negative except as listed in HPI, PMH and Problem List.  SH:  Social History   Social History  . Marital Status: Divorced    Spouse Name: N/A  . Number of Children: 1  . Years of Education: 12   Occupational History  . Not on file.   Social History Main Topics  . Smoking status: Never Smoker   . Smokeless tobacco: Never Used  . Alcohol Use: No     Comment: drank 1-2 drinks a week before pregnancy  . Drug Use: No  . Sexual Activity: Yes   Other Topics Concern  . Not on file   Social History Narrative   Lives with boyfriend and child   Is pregnant (20weeks) with second child   1st born is a boy and her second is a boy   Drinks one cup of coffee a day    FH:  Family History  Problem Relation Age of Onset  . Diabetes Mother   . Diabetes Maternal Grandmother   . Hypertension Father   .  Diabetes Paternal Grandmother   . Migraines Mother     Past Medical History  Diagnosis Date  . Infected pilonidal cyst   . Anxiety     diagnosed in 2007  . Diabetes mellitus     type 2 - uncontrolled  . Hypertension     benign essential  . Migraine   . Non-reassuring fetal heart rate or rhythm affecting mother   . CHF (congestive heart failure) ([place])     Current Outpatient Prescriptions  Medication Sig Dispense Refill  . butalbital-acetaminophen-caffeine (FIORICET, ESGIC) 50-325-40 MG tablet Take 1 tablet by mouth every 4 (four) hours as needed for headache. 14 tablet 0  . carvedilol (COREG) 3.125 MG tablet Take 1 tablet (3.125 mg total) by mouth 2 (two) times daily with a meal. 60 tablet 3  . cetirizine (ZYRTEC) 10 MG tablet Take 10 mg by mouth daily.    . digoxin (LANOXIN) 0.25 MG tablet Take 1 tablet (0.25 mg total) by mouth daily. 30 tablet 6  . insulin aspart (NOVOLOG) 100 UNIT/ML injection Inject 10-14 Units into the skin once. Sliding scale as directed with largest meal    . insulin glargine (LANTUS) 100 UNIT/ML injection Inject 40 Units into the skin at bedtime.    Marland Kitchen  Insulin Pen Needle (1ST CHOICE PEN NEEDLES) 31G X 6 MM MISC Use with insulin pen 100 each 0  . sacubitril-valsartan (ENTRESTO) 97-103 MG Take 1 tablet by mouth 2 (two) times daily. 60 tablet 6  . spironolactone (ALDACTONE) 25 MG tablet Take 0.5 tablets (12.5 mg total) by mouth daily. 15 tablet 3   No current facility-administered medications for this encounter.    Filed Vitals:   03/22/16 1406  BP: 172/112  Pulse: 93  Weight: 176 lb 6.4 oz (80.015 kg)  SpO2: 98%    PHYSICAL EXAM: General:  Well appearing. No resp difficulty. Ambulated in the clinic without difficulty. Boyfriend present.  HEENT: normal Neck: supple. JVP 5-6. Carotids 2+ bilaterally; no bruits. No lymphadenopathy or thryomegaly appreciated. Cor: PMI normal. Regular rate & rhythm. No rubs, gallops or murmurs.No S3 Lungs: clear Abdomen:  soft, nontender, nondistended. No hepatosplenomegaly. No bruits or masses. Good bowel sounds. Extremities: no cyanosis, clubbing, rash, no edema Neuro: alert & orientedx3, cranial nerves grossly intact. Moves all 4 extremities w/o difficulty. Affect pleasant.   ASSESSMENT & PLAN: 1. Chronic Systolic CHF- 99991111 EF 35%. - ? Viral Cardiomyopathy versus HTN. Also pre-eclampsia with both pregnancies. Dr Haroldine Laws performed a bedisde ECHO today EF ~35%.  NYHA II. Functionally doing better. Volume status stable.  - Increase spiro to 25 mg daily.   BMET today and in 2 weeks.  -Continue Entresto 97/103 mg twice a day.  -Continue carvedilol to 3.125 mg in am and increase night time dose 6.25 mg .   -Intolerant Bidil due to headache. Failed on 2 separate occasions.   - Continue Digoxin 0.25 daily. Check dig level  2 HTN - Elevated. As above increase spiro and carvedilol.  3 DM-  Managed by PCP.  4 OB- Stopped breast feeding April 1st. Norplant in place.   5. CAP- 12/2015. Completed antibiotics course.    Follow up in 2 weeks with BMET. She will see Dr Haroldine Laws in 6 weeks with an ECHO.    Sara Heyliger NP-C  2:06 PM

## 2016-03-31 ENCOUNTER — Encounter (HOSPITAL_COMMUNITY): Payer: Medicaid Other | Admitting: Internal Medicine

## 2016-04-05 ENCOUNTER — Inpatient Hospital Stay (HOSPITAL_COMMUNITY): Admission: RE | Admit: 2016-04-05 | Payer: Medicaid Other | Source: Ambulatory Visit

## 2016-05-03 ENCOUNTER — Ambulatory Visit (HOSPITAL_BASED_OUTPATIENT_CLINIC_OR_DEPARTMENT_OTHER)
Admission: RE | Admit: 2016-05-03 | Discharge: 2016-05-03 | Disposition: A | Discharge status: Home / Self Care | Payer: Medicaid Other | Source: Ambulatory Visit | Attending: Internal Medicine | Admitting: Internal Medicine

## 2016-05-03 ENCOUNTER — Ambulatory Visit (HOSPITAL_COMMUNITY)
Admission: RE | Admit: 2016-05-03 | Discharge: 2016-05-03 | Disposition: A | Discharge status: Home / Self Care | Payer: Medicaid Other | Source: Ambulatory Visit | Attending: Family | Admitting: Family

## 2016-05-03 ENCOUNTER — Encounter (HOSPITAL_COMMUNITY): Payer: Self-pay | Admitting: Internal Medicine

## 2016-05-03 VITALS — BP 134/84 | HR 77 | Wt 174.8 lb

## 2016-05-03 DIAGNOSIS — Z79899 Other long term (current) drug therapy: Secondary | ICD-10-CM | POA: Diagnosis not present

## 2016-05-03 DIAGNOSIS — I10 Essential (primary) hypertension: Secondary | ICD-10-CM | POA: Diagnosis not present

## 2016-05-03 DIAGNOSIS — F419 Anxiety disorder, unspecified: Secondary | ICD-10-CM | POA: Diagnosis not present

## 2016-05-03 DIAGNOSIS — I5022 Chronic systolic (congestive) heart failure: Secondary | ICD-10-CM

## 2016-05-03 DIAGNOSIS — E119 Type 2 diabetes mellitus without complications: Secondary | ICD-10-CM | POA: Diagnosis not present

## 2016-05-03 DIAGNOSIS — I110 Hypertensive heart disease with heart failure: Secondary | ICD-10-CM | POA: Diagnosis not present

## 2016-05-03 DIAGNOSIS — Z794 Long term (current) use of insulin: Secondary | ICD-10-CM | POA: Insufficient documentation

## 2016-05-03 DIAGNOSIS — I509 Heart failure, unspecified: Secondary | ICD-10-CM | POA: Diagnosis present

## 2016-05-03 LAB — ECHOCARDIOGRAM COMPLETE
CHL CUP MV DEC (S): 261
E decel time: 261 msec
E/e' ratio: 9.31
FS: 20 % — AB (ref 28–44)
IV/PV OW: 0.92
LA ID, A-P, ES: 39 mm
LA vol index: 16 mL/m2
LA vol: 28.9 mL
LADIAMINDEX: 2.15 cm/m2
LAVOLA4C: 30.9 mL
LDCA: 3.14 cm2
LEFT ATRIUM END SYS DIAM: 39 mm
LV E/e' medial: 9.31
LV TDI E'LATERAL: 7.97
LVEEAVG: 9.31
LVELAT: 7.97 cm/s
LVOTD: 20 mm
MV Peak grad: 2 mmHg
MV pk A vel: 52.4 m/s
MVPKEVEL: 74.2 m/s
PW: 14.1 mm — AB (ref 0.6–1.1)
RV TAPSE: 26.9 mm
TDI e' medial: 7.83

## 2016-05-03 LAB — BASIC METABOLIC PANEL
Anion gap: 7 (ref 5–15)
BUN: 6 mg/dL (ref 6–20)
CALCIUM: 9.6 mg/dL (ref 8.9–10.3)
CO2: 26 mmol/L (ref 22–32)
Chloride: 103 mmol/L (ref 101–111)
Creatinine, Ser: 0.76 mg/dL (ref 0.44–1.00)
GFR calc Af Amer: >60 mL/min (ref >60)
GLUCOSE: 258 mg/dL — AB (ref 65–99)
Potassium: 4 mmol/L (ref 3.5–5.1)
SODIUM: 136 mmol/L (ref 135–145)

## 2016-05-03 NOTE — Progress Notes (Signed)
Patient ID: Sara [name], female   DOB: 16-Feb-1986, 30 y.o.   MRN: [id number] PCP: Primary Cardiologist: Dr Haroldine Laws  HPI: Devany is a 30 year old with history of DM, HTN, and 2 pregnancies complicated by preeclampsia. She has a 28 year old and 44 month old.    Admitted  March 30th with increased dsypnea and PND.  Prior  to admit she had a virus. CXR was concerning for CAP. Completed antibiotic course. ECHO performed and showed reduced EF -->35%. Diuresed with IV lasix. Instructed to stop breast feeding with addition of HF meds and to prevent pregnancy. Started on entresto, carvedilol and lasix. Discharge weight was 175 pounds.   Today she returns for  follow up. Last visit spiro increased to 25 mg daily and carvedilol increased to 6.25 mg  at bed time. Doing ok but still with day time fatigue. Denies SOB/ PND/Orthopnea.  Weight at home 169-171 pounds. Not breast feeding. Takes all medications. Not working for now.    ECHO 04/2016 ~35-40%  ECHO 12/2015 ~30-35%.   Labs 5/4 2017 K 4.3 Creatinine 0.71 BNP 54 Labs 01/27/2016: BNP 274   SH: Lives with her boyfriend and 2 children.   ROS: All systems negative except as listed in HPI, PMH and Problem List.  SH:  Social History   Social History  . Marital Status: Divorced    Spouse Name: N/A  . Number of Children: 1  . Years of Education: 12   Occupational History  . Not on file.   Social History Main Topics  . Smoking status: Never Smoker   . Smokeless tobacco: Never Used  . Alcohol Use: No     Comment: drank 1-2 drinks a week before pregnancy  . Drug Use: No  . Sexual Activity: Yes   Other Topics Concern  . Not on file   Social History Narrative   Lives with boyfriend and child   Is pregnant (20weeks) with second child   1st born is a boy and her second is a boy   Drinks one cup of coffee a day    FH:  Family History  Problem Relation Age of Onset  . Diabetes Mother   . Diabetes Maternal Grandmother   . Hypertension  Father   . Diabetes Paternal Grandmother   . Migraines Mother     Past Medical History  Diagnosis Date  . Infected pilonidal cyst   . Anxiety     diagnosed in 2007  . Diabetes mellitus     type 2 - uncontrolled  . Hypertension     benign essential  . Migraine   . Non-reassuring fetal heart rate or rhythm affecting mother   . CHF (congestive heart failure) ([place])     Current Outpatient Prescriptions  Medication Sig Dispense Refill  . butalbital-acetaminophen-caffeine (FIORICET, ESGIC) 50-325-40 MG tablet Take 1 tablet by mouth every 4 (four) hours as needed for headache. 14 tablet 0  . carvedilol (COREG) 3.125 MG tablet Take 1-2 tablets (3.125-6.25 mg total) by mouth as directed. Take one tab in the AM and take two tabs in the PM 90 tablet 3  . cetirizine (ZYRTEC) 10 MG tablet Take 10 mg by mouth daily.    . digoxin (LANOXIN) 0.25 MG tablet Take 1 tablet (0.25 mg total) by mouth daily. 30 tablet 6  . insulin aspart (NOVOLOG) 100 UNIT/ML injection Inject 10-14 Units into the skin once. Sliding scale as directed with largest meal    . insulin glargine (LANTUS) 100  UNIT/ML injection Inject 40 Units into the skin at bedtime.    . Insulin Pen Needle (1ST CHOICE PEN NEEDLES) 31G X 6 MM MISC Use with insulin pen 100 each 0  . sacubitril-valsartan (ENTRESTO) 97-103 MG Take 1 tablet by mouth 2 (two) times daily. 60 tablet 6  . spironolactone (ALDACTONE) 25 MG tablet Take 1 tablet (25 mg total) by mouth daily. 30 tablet 3   No current facility-administered medications for this encounter.    Filed Vitals:   05/03/16 1414  BP: 134/84  Pulse: 77  Weight: 174 lb 12 oz (79.266 kg)  SpO2: 100%    PHYSICAL EXAM: General:  Well appearing. No resp difficulty. Ambulated in the clinic without difficulty. Boyfriend present.  HEENT: normal Neck: supple. JVP 5-6. Carotids 2+ bilaterally; no bruits. No lymphadenopathy or thryomegaly appreciated. Cor: PMI normal. Regular rate & rhythm. No rubs,  gallops or murmurs.No S3 Lungs: clear Abdomen: soft, nontender, nondistended. No hepatosplenomegaly. No bruits or masses. Good bowel sounds. Extremities: no cyanosis, clubbing, rash, no edema Neuro: alert & orientedx3, cranial nerves grossly intact. Moves all 4 extremities w/o difficulty. Affect pleasant.   ASSESSMENT & PLAN: 1. Chronic Systolic CHF- 99991111 EF 35%. - ? Viral Cardiomyopathy versus HTN. Also pre-eclampsia with both pregnancies.  ECHO- EF getting better-->  35-40%.  NYHA II-III. Functionally doing ok. Still having fatigue. Will check CPX.  Volume status stable.  Continue spiro to 25 mg daily.   -Continue Entresto 97/103 mg twice a day.  -Continue carvedilol to 3.125  mg and continue night time dose 6.25 mg .  Will not increase carvedilol with day time fatigue.  Intolerant Bidil due to headache. Failed on 2 separate occasions.   - Stop digoxin.  2 HTN -Improved but still elevated. Will likely need to add hydralazine (no nitrates) or amlodipine.  3 DM-  Managed by PCP.  4 OB- Stopped breast feeding April 1st. Norplant in place.    Follow up 6-8 weeks   Amy Clegg NP-C  2:34 PM   Patient seen and examined with Darrick Grinder, NP. We discussed all aspects of the encounter. I agree with the assessment and plan as stated above.   Echo reviewed personally. EF improving slowly but still with NYHA II-III symptoms. Will cotninue current regimen and plan CPX testing to further evaluate her cardiac limitation.

## 2016-05-03 NOTE — Progress Notes (Signed)
*  PRELIMINARY RESULTS* Echocardiogram 2D Echocardiogram has been performed.  Leavy Cella 05/03/2016, 2:06 PM

## 2016-05-03 NOTE — Patient Instructions (Signed)
Stop Digoxin  Lab today  Your physician has recommended that you have a cardiopulmonary stress test (CPX). CPX testing is a non-invasive measurement of heart and lung function. It replaces a traditional treadmill stress test. This type of test provides a tremendous amount of information that relates not only to your present condition but also for future outcomes. This test combines measurements of you ventilation, respiratory gas exchange in the lungs, electrocardiogram (EKG), blood pressure and physical response before, during, and following an exercise protocol.  Your physician recommends that you schedule a follow-up appointment in: 6 weeks

## 2016-05-04 ENCOUNTER — Telehealth (HOSPITAL_COMMUNITY): Payer: Self-pay | Admitting: *Deleted

## 2016-05-04 NOTE — Telephone Encounter (Signed)
Notes Recorded by Harvie Junior, CMA on 05/04/2016 at 9:24 AM Results sent to PCP Eloise Levels to address. Notes Recorded by Jolaine Artist, MD on 05/04/2016 at 12:11 AM Ok except for persistently high glucose. Needs to address with PCP       Ref Range 1d ago (05/03/16) 14mo ago (03/22/16) 35mo ago (03/02/16) 68mo ago (02/09/16) 71mo ago (02/02/16)   Sodium 135 - 145 mmol/L 136 135 138 139 142   Potassium 3.5 - 5.1 mmol/L 4.0 3.9 4.3 4.6 4.0   Chloride 101 - 111 mmol/L 103 102 104 101 106   CO2 22 - 32 mmol/L 26 26 25 28 25    Glucose, Bld 65 - 99 mg/dL 258 (H) 291 (H) 213 (H) 292 (H) 106 (H)   BUN 6 - 20 mg/dL 6 5 (L) <5 (L) 8 8   Creatinine, Ser 0.44 - 1.00 mg/dL 0.76 0.77 0.71 0.79 0.64   Calcium 8.9 - 10.3 mg/dL 9.6 9.8 9.7 10.0 9.4   GFR calc non Af Amer >60 mL/min >60 60 mL/min" class="rz_1r" style="cursor: pointer;" onmouseover='jscript: var varStyle="underline"; var bgColor="#DCD7CE"; this.style.backgroundColor=bgColor; var children=this.getElementsByTagName("div"); for(var child=0;child >60 60 mL/min" class="rz_1r" style="cursor: pointer;" onmouseover='jscript: var varStyle="underline"; var bgColor="#DCD7CE"; this.style.backgroundColor=bgColor; var children=this.getElementsByTagName("div"); for(var child=0;child >60 60 mL/min" class="rz_1r" style="cursor: pointer; background-color: rgb(228, 223, 214);" onmouseover='jscript: var varStyle="underline"; var bgColor="#DCD7CE"; this.style.backgroundColor=bgColor; var children=this.getElementsByTagName("div"); for(var child=0;child >60 60 mL/min" class="rz_1s" style="cursor: pointer; background-color: rgb(228, 223, 214);" onmouseover='jscript: var varStyle="underline"; var bgColor="#DCD7CE"; this.style.backgroundColor=bgColor; var children=this.getElementsByTagName("div"); for(var child=0;child >60   GFR calc Af Amer >60 mL/min >60 60 mL/min" class="rz_1r" style="cursor: pointer;" onmouseover='jscript: var varStyle="underline"; var  bgColor="#DCD7CE"; this.style.backgroundColor=bgColor; var children=this.getElementsByTagName("div"); for(var child=0;child >60CM 60 mL/min" class="rz_1r" style="cursor: pointer; background-color: rgb(228, 223, 214);" onmouseover='jscript: var varStyle="underline"; var bgColor="#DCD7CE"; this.style.backgroundColor=bgColor; var children=this.getElementsByTagName("div"); for(var child=0;child >60CM 60 mL/min" class="rz_1r" style="cursor: pointer; background-color: rgb(228, 223, 214);" onmouseover='jscript: var varStyle="underline"; var bgColor="#DCD7CE"; this.style.backgroundColor=bgColor; var children=this.getElementsByTagName("div"); for(var child=0;child >60CM 60 mL/min" class="rz_1s" style="cursor: pointer; background-color: rgb(228, 223, 214);" onmouseover='jscript: var varStyle="underline"; var bgColor="#DCD7CE"; this.style.backgroundColor=bgColor; var children=this.getElementsByTagName("div"); for(var child=0;child >60CM

## 2016-05-15 ENCOUNTER — Ambulatory Visit (HOSPITAL_COMMUNITY): Payer: Medicaid Other | Attending: Internal Medicine

## 2016-05-15 DIAGNOSIS — I5022 Chronic systolic (congestive) heart failure: Secondary | ICD-10-CM | POA: Diagnosis not present

## 2016-05-16 DIAGNOSIS — I5022 Chronic systolic (congestive) heart failure: Secondary | ICD-10-CM | POA: Diagnosis not present

## 2016-05-18 ENCOUNTER — Telehealth (HOSPITAL_COMMUNITY): Payer: Self-pay | Admitting: Cardiology

## 2016-05-18 NOTE — Telephone Encounter (Signed)
Nurse case manager with [employer] called to request a update after patients most recent OV in July. This information will be needed to complete patients claim, and funds are given  [facility]

## 2016-06-14 ENCOUNTER — Telehealth (HOSPITAL_COMMUNITY): Payer: Self-pay | Admitting: Vascular Surgery

## 2016-06-14 ENCOUNTER — Telehealth (HOSPITAL_COMMUNITY): Payer: Self-pay

## 2016-06-14 NOTE — Telephone Encounter (Signed)
Patient called CHF clinic to request medical records be sent in for her short term disability claim. Patient states disability office has been reaching out to our office via fax mutiple times, however, nothing from our end has been received, no forms/requests for patient in disability folder. Will fax all records from our office/[employer]oviders 02/2016-present per patient request to provided # [phone] attn: Glean Hess.  Renee Pain, RN

## 2016-06-14 NOTE — Telephone Encounter (Signed)
Pt called she states her short term disability has been calling to get records since May, pt would like someone to call her when these records are sent.. Please advise

## 2016-06-14 NOTE — Telephone Encounter (Signed)
Addressed in other phone note from today. 

## 2016-06-20 ENCOUNTER — Ambulatory Visit (HOSPITAL_COMMUNITY)
Admission: RE | Admit: 2016-06-20 | Discharge: 2016-06-20 | Disposition: A | Discharge status: Home / Self Care | Payer: Medicaid Other | Source: Ambulatory Visit | Attending: Internal Medicine | Admitting: Internal Medicine

## 2016-06-20 ENCOUNTER — Telehealth (HOSPITAL_COMMUNITY): Payer: Self-pay

## 2016-06-20 ENCOUNTER — Other Ambulatory Visit (HOSPITAL_COMMUNITY): Payer: Self-pay

## 2016-06-20 VITALS — BP 136/82 | HR 88 | Wt 176.5 lb

## 2016-06-20 DIAGNOSIS — Z8249 Family history of ischemic heart disease and other diseases of the circulatory system: Secondary | ICD-10-CM | POA: Insufficient documentation

## 2016-06-20 DIAGNOSIS — Z833 Family history of diabetes mellitus: Secondary | ICD-10-CM | POA: Diagnosis not present

## 2016-06-20 DIAGNOSIS — Z794 Long term (current) use of insulin: Secondary | ICD-10-CM | POA: Diagnosis not present

## 2016-06-20 DIAGNOSIS — E119 Type 2 diabetes mellitus without complications: Secondary | ICD-10-CM | POA: Insufficient documentation

## 2016-06-20 DIAGNOSIS — Z79899 Other long term (current) drug therapy: Secondary | ICD-10-CM | POA: Insufficient documentation

## 2016-06-20 DIAGNOSIS — I110 Hypertensive heart disease with heart failure: Secondary | ICD-10-CM | POA: Insufficient documentation

## 2016-06-20 DIAGNOSIS — I5022 Chronic systolic (congestive) heart failure: Secondary | ICD-10-CM | POA: Diagnosis not present

## 2016-06-20 MED ORDER — CARVEDILOL 6.25 MG PO TABS: 6.2500 mg | ORAL_TABLET | Freq: Two times a day (BID) | ORAL | 3 refills | Status: DC

## 2016-06-20 MED ORDER — CARVEDILOL 6.25 MG PO TABS: 6.2500 mg | ORAL_TABLET | ORAL | 3 refills | Status: DC

## 2016-06-20 NOTE — Patient Instructions (Signed)
Increase Carvedilol to 6.25 mg Twice daily   Your physician recommends that you schedule a follow-up appointment in: 6 weeks

## 2016-06-20 NOTE — Telephone Encounter (Signed)
Pharmacy called CHF triage to clarify coreg. Sent in saying "take as directed", per Dr. Clayborne Dana note should be 6.25 mg twice daily.  Resent with correct instructions.  Renee Pain, RN

## 2016-06-20 NOTE — Progress Notes (Signed)
Patient ID: Sara [name], female   DOB: 11-30-1985, 30 y.o.   MRN: [id number] PCP: Primary Cardiologist: Dr Haroldine Laws  HPI: Sara [name] is a 30 year old with history of DM, HTN, and 2 pregnancies complicated by preeclampsia. She has a 28 year old and 80 month old.    Admitted  March 30th with increased dsypnea and PND.  Prior  to admit she had a virus. CXR was concerning for CAP. Completed antibiotic course. ECHO performed and showed reduced EF -->35%. Diuresed with IV lasix. Instructed to stop breast feeding with addition of HF meds and to prevent pregnancy. Started on entresto, carvedilol and lasix. Discharge weight was 175 pounds.   Today she returns for  follow up. Overall doing ok. Feels like no real change Able to do most activities but gets tired easily. Weight up 3 pounds this week but came back down. No on lasix. Remains on spiro  25 mg daily and carvedilol 3.125/6.25 mg   Denies SOB/ PND/Orthopnea.  Weight at home 169-171 pounds. Not breast feeding. Takes all medications. Not working for now.    ECHO 04/2016 ~35-40%  ECHO 12/2015 ~30-35%.   CPX 7/17  FVC 2.68 (86%)    FEV1 2.43 (91%)       BP rest: 118/64 BP peak: 156/84 Peak VO2: 17.9 (63.9% predicted peak VO2) - corrects to 23.4 fo IBW VE/VCO2 slope: 31.2 OUES: 1.63 Peak RER: 1.17 Ventilatory Threshold: 13.9 (49.6% predicted or measured peak VO2) VE/MVV: 55.9% O2pulse: 10  (83% predicted O2pulse)  Labs 5/4 2017 K 4.3 Creatinine 0.71 BNP 54 Labs 01/27/2016: BNP 274   SH: Lives with her boyfriend and 2 children.   ROS: All systems negative except as listed in HPI, PMH and Problem List.  SH:  Social History   Social History  . Marital status: Divorced    Spouse name: N/A  . Number of children: 1  . Years of education: 15   Occupational History  . Not on file.   Social History Main Topics  . Smoking status: Never Smoker  . Smokeless tobacco: Never Used  . Alcohol use No     Comment: drank 1-2 drinks a  week before pregnancy  . Drug use: No  . Sexual activity: Yes   Other Topics Concern  . Not on file   Social History Narrative   Lives with boyfriend and child   Is pregnant (20weeks) with second child   1st born is a boy and her second is a boy   Drinks one cup of coffee a day    FH:  Family History  Problem Relation Age of Onset  . Diabetes Mother   . Diabetes Maternal Grandmother   . Hypertension Father   . Diabetes Paternal Grandmother   . Migraines Mother     Past Medical History:  Diagnosis Date  . Anxiety    diagnosed in 2007  . CHF (congestive heart failure) ([place])   . Diabetes mellitus    type 2 - uncontrolled  . Hypertension    benign essential  . Infected pilonidal cyst   . Migraine   . Non-reassuring fetal heart rate or rhythm affecting mother     Current Outpatient Prescriptions  Medication Sig Dispense Refill  . butalbital-acetaminophen-caffeine (FIORICET, ESGIC) 50-325-40 MG tablet Take 1 tablet by mouth every 4 (four) hours as needed for headache. 14 tablet 0  . carvedilol (COREG) 3.125 MG tablet Take 1-2 tablets (3.125-6.25 mg total) by mouth as directed. Take one tab in  the AM and take two tabs in the PM 90 tablet 3  . cetirizine (ZYRTEC) 10 MG tablet Take 10 mg by mouth daily.    . insulin aspart (NOVOLOG) 100 UNIT/ML injection Inject 10-14 Units into the skin once. Sliding scale as directed with largest meal    . insulin glargine (LANTUS) 100 UNIT/ML injection Inject 42 Units into the skin at bedtime.     . Insulin Pen Needle (1ST CHOICE PEN NEEDLES) 31G X 6 MM MISC Use with insulin pen 100 each 0  . sacubitril-valsartan (ENTRESTO) 97-103 MG Take 1 tablet by mouth 2 (two) times daily. 60 tablet 6  . sitaGLIPtin (JANUVIA) 25 MG tablet Take 25 mg by mouth daily.    Marland Kitchen spironolactone (ALDACTONE) 25 MG tablet Take 1 tablet (25 mg total) by mouth daily. 30 tablet 3   No current facility-administered medications for this encounter.     Vitals:    06/20/16 1348  BP: 136/82  Pulse: 88  SpO2: 100%  Weight: 176 lb 8 oz (80.1 kg)    PHYSICAL EXAM: General:  Well appearing. No resp difficulty. Ambulated in the clinic without difficulty. Boyfriend present.  HEENT: normal Neck: supple. JVP flat Carotids 2+ bilaterally; no bruits. No lymphadenopathy or thryomegaly appreciated. Cor: PMI normal. Regular rate & rhythm. No rubs, gallops or murmurs.No S3 Lungs: clear Abdomen: soft, nontender, nondistended. No hepatosplenomegaly. No bruits or masses. Good bowel sounds. Extremities: no cyanosis, clubbing, rash, no edema Neuro: alert & orientedx3, cranial nerves grossly intact. Moves all 4 extremities w/o difficulty. Affect pleasant.   ASSESSMENT & PLAN: 1. Chronic Systolic CHF- 99991111 EF 35%. - ? Viral Cardiomyopathy versus HTN. Also pre-eclampsia with both pregnancies.  ECHO (7/17) EF ->  35%.  -NYHA II-III. Confirmed by CPX.  -Volume status stable.     - Continue spiro to 25 mg daily. - Increase carvedilol to 6.25 bid - Intolerant Bidil due to headache. Failed on 2 separate occasions.   - If not feeling better in 6 weeks restart digoxin or consdier ivabradine - cMRI in 3 months 2 HTN -Improved. If goes up again. Will likely need to add hydralazine (no nitrates) or amlodipine.  3 DM-  Managed by PCP.  4 OB-  Norplant in place.    Follow up 6 weeks   Sindhu Nguyen MD 2:15 PM

## 2016-06-20 NOTE — Addendum Note (Signed)
Encounter addended by: Scarlette Calico, RN on: 06/20/2016  2:35 PM<BR>    Actions taken: Order Entry activity accessed, Sign clinical note

## 2016-06-26 ENCOUNTER — Telehealth (HOSPITAL_COMMUNITY): Payer: Self-pay | Admitting: *Deleted

## 2016-06-26 MED ORDER — FUROSEMIDE 20 MG PO TABS
20.0000 mg | ORAL_TABLET | ORAL | 3 refills | Status: DC | PRN
Start: 2016-06-26 — End: 2017-06-12

## 2016-06-26 NOTE — Telephone Encounter (Signed)
Per Dr Haroldine Laws ok for Lasix 20 mg as needed for wt gain, pt aware and agreeable, rx sent in

## 2016-06-26 NOTE — Telephone Encounter (Signed)
Pt called to report a 4 lb wt gain over the weekend.  She states at OV last week Dr Haroldine Laws told her she could take Lasix prn but she did not receive a prescription for this.  Advised it is not mentioned in her note, will discuss w/Dr Bensimhon and call her back.  She does use the [facility] on [address]

## 2016-07-03 ENCOUNTER — Telehealth: Payer: Self-pay | Admitting: Physician Assistant

## 2016-07-03 NOTE — Telephone Encounter (Signed)
Paged by answering service. 4 lb weight gain in last 24 hours.   She took lasix 20mg  last Monday and Tuesday. Weight has been between 172-173 lb as baseline.   Saturday her weight was 07/01/16 176lb and Sunday 07/02/16 it was 173. No need of lasix.  However today her weight was 177lb. Also noted LE edema. No SOB, orthopnea or pnd. No extra added salt.   Advice to take lasix 20mg  today.  Seems she is not weighing her self at same time --> advised to weight first thing in morning. IF no Improvement, call office.

## 2016-07-21 ENCOUNTER — Encounter (HOSPITAL_COMMUNITY): Payer: Self-pay

## 2016-07-21 NOTE — Progress Notes (Signed)
[employer] Disability and restriction forms dated 06/14/2016 received to CHF clinic today.  Paperwork was mailed to wrong address.  Forms completed, pending review and signature by Dr. Haroldine Laws.  Renee Pain, RN

## 2016-07-24 ENCOUNTER — Encounter (HOSPITAL_COMMUNITY): Payer: Self-pay

## 2016-07-24 NOTE — Progress Notes (Signed)
Disability claim mailed from [facility] on 06/14/2016 (delay due to documents mailed to wrong office initially) completed, reviewed, and signed by Dr. Haroldine Laws and faxed to provided fax # [phone] (34 pages total) with supporting/requested documentation. Copy of forms scanned into patient's electronic medical record.  Renee Pain, RN

## 2016-08-02 ENCOUNTER — Encounter (HOSPITAL_COMMUNITY): Payer: Self-pay

## 2016-08-02 ENCOUNTER — Ambulatory Visit (HOSPITAL_COMMUNITY)
Admission: RE | Admit: 2016-08-02 | Discharge: 2016-08-02 | Disposition: A | Discharge status: Home / Self Care | Payer: Medicaid Other | Source: Ambulatory Visit | Attending: Internal Medicine | Admitting: Internal Medicine

## 2016-08-02 VITALS — BP 156/100 | HR 95 | Ht 62.0 in | Wt 175.0 lb

## 2016-08-02 DIAGNOSIS — Z79899 Other long term (current) drug therapy: Secondary | ICD-10-CM | POA: Insufficient documentation

## 2016-08-02 DIAGNOSIS — F419 Anxiety disorder, unspecified: Secondary | ICD-10-CM | POA: Diagnosis not present

## 2016-08-02 DIAGNOSIS — I422 Other hypertrophic cardiomyopathy: Secondary | ICD-10-CM | POA: Diagnosis not present

## 2016-08-02 DIAGNOSIS — E119 Type 2 diabetes mellitus without complications: Secondary | ICD-10-CM

## 2016-08-02 DIAGNOSIS — Z794 Long term (current) use of insulin: Secondary | ICD-10-CM

## 2016-08-02 DIAGNOSIS — I110 Hypertensive heart disease with heart failure: Secondary | ICD-10-CM | POA: Insufficient documentation

## 2016-08-02 DIAGNOSIS — I10 Essential (primary) hypertension: Secondary | ICD-10-CM

## 2016-08-02 DIAGNOSIS — I5022 Chronic systolic (congestive) heart failure: Secondary | ICD-10-CM

## 2016-08-02 DIAGNOSIS — Z9119 Patient's noncompliance with other medical treatment and regimen: Secondary | ICD-10-CM | POA: Diagnosis not present

## 2016-08-02 LAB — BASIC METABOLIC PANEL
ANION GAP: 9 (ref 5–15)
BUN: 9 mg/dL (ref 6–20)
CO2: 23 mmol/L (ref 22–32)
Calcium: 9.9 mg/dL (ref 8.9–10.3)
Chloride: 102 mmol/L (ref 101–111)
Creatinine, Ser: 0.72 mg/dL (ref 0.44–1.00)
Glucose, Bld: 278 mg/dL — ABNORMAL HIGH (ref 65–99)
POTASSIUM: 4.9 mmol/L (ref 3.5–5.1)
SODIUM: 134 mmol/L — AB (ref 135–145)

## 2016-08-02 MED ORDER — CARVEDILOL 3.125 MG PO TABS: 3.1250 mg | ORAL_TABLET | Freq: Two times a day (BID) | ORAL | 6 refills | Status: DC

## 2016-08-02 NOTE — Patient Instructions (Signed)
Routine lab work today. Will notify you of abnormal results, otherwise no news is good news!  Will schedule you for Cardiac MRI at [facility]. Radiology department will call you to schedule.  Resume Entresto at one tablet TWICE daily.  DECREASE Coreg to 3.125 mg twice daily. Can half your 6.25 mg tablets, new Rx will be correct 3.125 mg tablet.  Follow up with [facility] CHF Clinical Pharmacist in 4 weeks.  Follow up with Dr. Haroldine Laws in 8 weeks.  Do the following things EVERYDAY: 1) Weigh yourself in the morning before breakfast. Write it down and keep it in a log. 2) Take your medicines as prescribed 3) Eat low salt foods-Limit salt (sodium) to 2000 mg per day.  4) Stay as active as you can everyday 5) Limit all fluids for the day to less than 2 liters

## 2016-08-02 NOTE — Progress Notes (Signed)
Patient ID: Sara [name], female   DOB: Mar 02, 1986, 30 y.o.   MRN: [id number] PCP: Primary Cardiologist: Dr Haroldine Laws  HPI: Sara [name] is a 30 year old with history of DM, HTN, and 2 pregnancies complicated by preeclampsia. She has a 28 year old and 14 month old.    Admitted  March 30th with increased dsypnea and PND.  Prior  to admit she had a virus. CXR was concerning for CAP. Completed antibiotic course. ECHO performed and showed reduced EF -->35%. Diuresed with IV lasix. Instructed to stop breast feeding with addition of HF meds and to prevent pregnancy. Started on entresto, carvedilol and lasix. Discharge weight was 175 pounds.   She returns today for regular follow up.  Has had a busy week so didn't take her medications last night. She states she was getting migraines by the end of the day after coreg increased, so held morning doses of Entresto and Coreg. Breathing has been fine.  Can walk for 10-15 minutes before getting tired and having to stop. Weight at home 175-178 lbs. Denies peripheral edema, orthopnea, or bendopnea.  Very occasional lightheadedness with rapid standing, but not marked or limiting.    Weight at home 169-171 pounds. Not breast feeding. Takes all medications. Not working for now.    ECHO 04/2016 ~35-40%  ECHO 12/2015 ~30-35%.   CPX 7/17  FVC 2.68 (86%)    FEV1 2.43 (91%)       BP rest: 118/64 BP peak: 156/84 Peak VO2: 17.9 (63.9% predicted peak VO2) - corrects to 23.4 fo IBW VE/VCO2 slope: 31.2 OUES: 1.63 Peak RER: 1.17 Ventilatory Threshold: 13.9 (49.6% predicted or measured peak VO2) VE/MVV: 55.9% O2pulse: 10  (83% predicted O2pulse)  Labs 5/4 2017 K 4.3 Creatinine 0.71 BNP 54 Labs 01/27/2016: BNP 274   SH: Lives with her boyfriend and 2 children.   ROS: All systems negative except as listed in HPI, PMH and Problem List.  SH:  Social History   Social History  . Marital status: Divorced    Spouse name: N/A  . Number of children: 1  .  Years of education: 33   Occupational History  . Not on file.   Social History Main Topics  . Smoking status: Never Smoker  . Smokeless tobacco: Never Used  . Alcohol use No     Comment: drank 1-2 drinks a week before pregnancy  . Drug use: No  . Sexual activity: Yes   Other Topics Concern  . Not on file   Social History Narrative   Lives with boyfriend and child   Is pregnant (20weeks) with second child   1st born is a boy and her second is a boy   Drinks one cup of coffee a day    FH:  Family History  Problem Relation Age of Onset  . Diabetes Mother   . Diabetes Maternal Grandmother   . Hypertension Father   . Diabetes Paternal Grandmother   . Migraines Mother     Past Medical History:  Diagnosis Date  . Anxiety    diagnosed in 2007  . CHF (congestive heart failure) ([place])   . Diabetes mellitus    type 2 - uncontrolled  . Hypertension    benign essential  . Infected pilonidal cyst   . Migraine   . Non-reassuring fetal heart rate or rhythm affecting mother     Current Outpatient Prescriptions  Medication Sig Dispense Refill  . butalbital-acetaminophen-caffeine (FIORICET, ESGIC) 50-325-40 MG tablet Take 1 tablet by mouth  every 4 (four) hours as needed for headache. 14 tablet 0  . carvedilol (COREG) 6.25 MG tablet Take 1 tablet (6.25 mg total) by mouth 2 (two) times daily with a meal. 60 tablet 3  . cetirizine (ZYRTEC) 10 MG tablet Take 10 mg by mouth daily.    . furosemide (LASIX) 20 MG tablet Take 1 tablet (20 mg total) by mouth as needed (for weight gain). 30 tablet 3  . insulin aspart (NOVOLOG) 100 UNIT/ML injection Inject 10-14 Units into the skin once. Sliding scale as directed with largest meal    . insulin glargine (LANTUS) 100 UNIT/ML injection Inject 42 Units into the skin at bedtime.     . Insulin Pen Needle (1ST CHOICE PEN NEEDLES) 31G X 6 MM MISC Use with insulin pen 100 each 0  . Multiple Vitamins-Minerals (MULTIVITAMIN WITH MINERALS) tablet Take 1  tablet by mouth daily.    . sacubitril-valsartan (ENTRESTO) 97-103 MG Take 1 tablet by mouth 2 (two) times daily. (Patient taking differently: Take 1 tablet by mouth daily. ) 60 tablet 6  . sitaGLIPtin (JANUVIA) 25 MG tablet Take 25 mg by mouth daily.    Marland Kitchen spironolactone (ALDACTONE) 25 MG tablet Take 1 tablet (25 mg total) by mouth daily. 30 tablet 3   No current facility-administered medications for this encounter.     Vitals:   08/02/16 1204  BP: (!) 156/100  Pulse: 95  Weight: 175 lb (79.4 kg)  Height: 5\' 2"  (1.575 m)    PHYSICAL EXAM: General:  Well appearing. No resp difficulty. Ambulated in the clinic without difficulty.  HEENT: normal Neck: supple. JVP flat Carotids 2+ bilaterally; no bruits. No thyromegaly or nodule noted.  Cor: PMI normal. RRR. No M/G/R. No S3 Lungs: CTAB, normal effort Abdomen: soft, NT, ND, no HSM. No bruits or masses. +BS  Extremities: no cyanosis, clubbing, rash, no edema Neuro: alert & orientedx3, cranial nerves grossly intact. Moves all 4 extremities w/o difficulty. Affect pleasant.   ASSESSMENT & PLAN: 1. Chronic Systolic CHF- 99991111 EF 35%. - ? Viral Cardiomyopathy versus HTN. Also pre-eclampsia with both pregnancies.  ECHO (7/17) EF ->  35%.  -NYHA II-III. Confirmed by CPX.  -Volume status stable on exam.  - Resume Entresto 97/103 mg BID.  (had been taking once daily)  BMET today.  - Continue spiro to 25 mg daily. - Restart coreg at 3.125 mg BID.  She is intolerant of carvedilol to 6.25 bid. Will decrease dose to try to improve tolerance with some ongoing daytime fatigue.  - Intolerant Bidil due to headache. Failed on 2 separate occasions.   - Follow up with pharm in 4 weeks digoxin or consider ivabradine - Proceed with cMRI 2 HTN  - Elevated in setting of medical non-compliance.  - Resuming entresto BID dosing and Coreg same as above.  - Could consider hydralazine (no nitrates) or amlodipine in the future  3 DM-  Managed by PCP.   4 OB-  Norplant in place.    Resume BID dosing of coreg and Entresto as above.  BMET today.  Follow up 4 weeks with pharmacy to consider Ivabridine vs digoxin.  MD to speak with her that visit as well.   Shirley Friar PA-C 12:25 PM   Patient seen and examined with Oda Kilts, PA-C. We discussed all aspects of the encounter. I agree with the assessment and plan as stated above.   She continues with NYHA II-III HF symptoms. CPX favors a more mild limitation.Volume status ok. BP  up in setting of medication noncompliance. Stressed need for compliance. Will get cMRI to better define EF to help with decision re ICD. As she is intolerant of b-blocker titration consider ivabradine at next visit.  Bensimhon, Daniel,MD 9:53 PM

## 2016-08-04 ENCOUNTER — Telehealth (HOSPITAL_COMMUNITY): Payer: Self-pay | Admitting: Cardiology

## 2016-08-04 DIAGNOSIS — I509 Heart failure, unspecified: Secondary | ICD-10-CM

## 2016-08-04 NOTE — Telephone Encounter (Signed)
-----   Message from Shirley Friar, PA-C sent at 08/02/2016  1:57 PM EDT ----- Stable. Potassium borderline high.  Needs repeat labs 10-14 days with resumption of BID Entresto.    Legrand Como [address] [place], PA-C 08/02/2016 1:57 PM

## 2016-08-04 NOTE — Telephone Encounter (Signed)
Pt aware.

## 2016-08-08 ENCOUNTER — Telehealth: Payer: Self-pay | Admitting: Student

## 2016-08-08 NOTE — Telephone Encounter (Signed)
Called patient and gave her, date, time and location of Cardiac MRI.

## 2016-08-14 ENCOUNTER — Ambulatory Visit (HOSPITAL_COMMUNITY)
Admission: RE | Admit: 2016-08-14 | Discharge: 2016-08-14 | Disposition: A | Discharge status: Home / Self Care | Payer: Medicaid Other | Source: Ambulatory Visit | Attending: Internal Medicine | Admitting: Internal Medicine

## 2016-08-14 ENCOUNTER — Ambulatory Visit (HOSPITAL_COMMUNITY)
Admission: RE | Admit: 2016-08-14 | Discharge: 2016-08-14 | Disposition: A | Discharge status: Home / Self Care | Payer: Medicaid Other | Source: Ambulatory Visit | Attending: Student | Admitting: Student

## 2016-08-14 DIAGNOSIS — I509 Heart failure, unspecified: Secondary | ICD-10-CM | POA: Diagnosis not present

## 2016-08-14 DIAGNOSIS — I422 Other hypertrophic cardiomyopathy: Secondary | ICD-10-CM

## 2016-08-14 LAB — BASIC METABOLIC PANEL
Anion gap: 6 (ref 5–15)
BUN: 7 mg/dL (ref 6–20)
CALCIUM: 9.4 mg/dL (ref 8.9–10.3)
CO2: 28 mmol/L (ref 22–32)
Chloride: 104 mmol/L (ref 101–111)
Creatinine, Ser: 0.79 mg/dL (ref 0.44–1.00)
GFR calc Af Amer: >60 mL/min (ref >60)
GLUCOSE: 206 mg/dL — AB (ref 65–99)
Potassium: 4 mmol/L (ref 3.5–5.1)
SODIUM: 138 mmol/L (ref 135–145)

## 2016-08-21 ENCOUNTER — Telehealth (HOSPITAL_COMMUNITY): Payer: Self-pay

## 2016-08-21 NOTE — Telephone Encounter (Signed)
Patient called CHF clinic triage to receive cMRI report. Reviewed stable results with patient as reviewed by Dr. Haroldine Laws.  Renee Pain, RN

## 2016-08-29 ENCOUNTER — Telehealth (HOSPITAL_COMMUNITY): Payer: Self-pay | Admitting: *Deleted

## 2016-08-30 ENCOUNTER — Ambulatory Visit (HOSPITAL_COMMUNITY)
Admission: RE | Admit: 2016-08-30 | Discharge: 2016-08-30 | Disposition: A | Discharge status: Home / Self Care | Payer: Medicaid Other | Source: Ambulatory Visit | Attending: Cardiology | Admitting: Cardiology

## 2016-08-30 DIAGNOSIS — I110 Hypertensive heart disease with heart failure: Secondary | ICD-10-CM | POA: Insufficient documentation

## 2016-08-30 DIAGNOSIS — E119 Type 2 diabetes mellitus without complications: Secondary | ICD-10-CM | POA: Insufficient documentation

## 2016-08-30 DIAGNOSIS — I5022 Chronic systolic (congestive) heart failure: Secondary | ICD-10-CM | POA: Diagnosis not present

## 2016-08-30 MED ORDER — IVABRADINE HCL 5 MG PO TABS: 5.0000 mg | ORAL_TABLET | Freq: Two times a day (BID) | ORAL | 5 refills | Status: DC

## 2016-08-30 NOTE — Progress Notes (Signed)
HPI:  Sara [name] is a 30 year old AA female with history of DM, HTN, and 2 pregnancies complicated by preeclampsia. She has a 28 year old and 76 month old.   Admitted  March 30th with increased dsypnea and PND. Prior  to admit she had a virus. CXR was concerning for CAP. Completed antibiotic course. ECHO performed and showed reduced EF -->35%. Diuresed with IV lasix. Instructed to stop breast feeding with addition of HF meds and to prevent pregnancy. Started on entresto, carvedilol and lasix. Discharge weight was 175 pounds.   She returns today for pharmacist-led HF medication titration with her 1 month old son and boyfriend. At her last HF clinic visit on 10/4, her carvedilol dose was reduced to 3.125 mg BID since she was having migraines with higher dose and she was told to take her Entresto 97/103 mg BID instead of daily. She stated that when she started doing this she had migraines daily for about a week but this has subsided since then. She also has been taking spironolactone 12.5 mg daily instead of 25 mg daily since she was told that her K was high and should decrease her dose. Not breast feeding. Takes all medications. Not working for now (working on long-term disability).   . Shortness of breath/dyspnea on exertion? no  . Orthopnea/PND? no . Edema? no . Lightheadedness/dizziness? yes . Daily weights at home? Yes - stable 175-176 lb  . Blood pressure/heart rate monitoring at home? Yes - 120-140/60 mmHg . Following low-sodium/fluid-restricted diet? Yes - tries to stay away from salty foods; < 2L daily   HF Medications: Carvedilol 3.125 mg PO BID Furosemide 20 mg PO daily PRN weight gain - has not needed in over a month  Entresto 97/103 mg PO BID Spironolactone 12.5 mg PO daily   Precautions/Contraindications: Bidil caused intolerable daily headaches/migraines  Has the patient been experiencing any side effects to the medications prescribed?  no  Does the patient have any problems  obtaining medications due to transportation or finances?   No - [place] Medicaid  Understanding of regimen: good Understanding of indications: good Potential of compliance: good Patient understands to avoid NSAIDs. Patient understands to avoid decongestants.    Pertinent Lab Values: . 08/14/16: Serum creatinine 0.79 (BL ~0.7-0.8), BUN 7, Potassium 4.0, Sodium 138  Vital Signs: . Weight: 181 lb (dry weight: 175-180 lb) . Blood pressure: 124/80 mmHg  . Heart rate: 90 bpm   Assessment: 1. Chronicsystolic CHF (EF [id number]), due to ?viral CM vs. HTN. NYHA class IIsymptoms.  - Volume status stable on exam. Patient states weight gain likely 2/2 decreased activity/increased intake.  - Start Corlanor 5 mg BID  - Continue carvedilol 3.125 mg BID, Entresto 97/103 mg BID, spironolactone 12.5 mg daily - Basic disease state pathophysiology, medication indication, mechanism and side effects reviewed at length with patient and he verbalized understanding 2 HTN  - At goal today on meds as above 3 DM - Managed by PCP 4 OB  - Norplant in place  Plan: 1) Medication changes: Based on clinical presentation, vital signs and recent labs will initiate Corlanor 5 mg BID 2) Labs: BMET at follow up appt  3) Follow-up: Dr. Haroldine Laws on 12/5   Ruta Hinds. Velva Harman, PharmD, BCPS, CPP Clinical Pharmacist Pager: [phone] Phone: [phone] 08/30/2016 2:22 PM   Chart reviewed. Agree with plan as above.  Glori Bickers MD

## 2016-08-30 NOTE — Patient Instructions (Signed)
Please start Corlanor 5 mg TWICE DAILY.   Please keep your appointment with Dr. Haroldine Laws on 12/5.

## 2016-10-03 ENCOUNTER — Encounter (HOSPITAL_COMMUNITY): Payer: Self-pay | Admitting: Internal Medicine

## 2016-10-03 ENCOUNTER — Ambulatory Visit (HOSPITAL_COMMUNITY)
Admission: RE | Admit: 2016-10-03 | Discharge: 2016-10-03 | Disposition: A | Discharge status: Home / Self Care | Payer: Medicaid Other | Source: Ambulatory Visit | Attending: Internal Medicine | Admitting: Internal Medicine

## 2016-10-03 VITALS — BP 135/71 | HR 83 | Wt 183.4 lb

## 2016-10-03 DIAGNOSIS — E119 Type 2 diabetes mellitus without complications: Secondary | ICD-10-CM | POA: Insufficient documentation

## 2016-10-03 DIAGNOSIS — F419 Anxiety disorder, unspecified: Secondary | ICD-10-CM | POA: Insufficient documentation

## 2016-10-03 DIAGNOSIS — I110 Hypertensive heart disease with heart failure: Secondary | ICD-10-CM | POA: Diagnosis not present

## 2016-10-03 DIAGNOSIS — Z79899 Other long term (current) drug therapy: Secondary | ICD-10-CM | POA: Insufficient documentation

## 2016-10-03 DIAGNOSIS — Z794 Long term (current) use of insulin: Secondary | ICD-10-CM | POA: Insufficient documentation

## 2016-10-03 DIAGNOSIS — I5022 Chronic systolic (congestive) heart failure: Secondary | ICD-10-CM

## 2016-10-03 DIAGNOSIS — I10 Essential (primary) hypertension: Secondary | ICD-10-CM | POA: Diagnosis not present

## 2016-10-03 MED ORDER — CARVEDILOL 6.25 MG PO TABS: 6.2500 mg | ORAL_TABLET | Freq: Two times a day (BID) | ORAL | 3 refills | Status: DC

## 2016-10-03 NOTE — Addendum Note (Signed)
Encounter addended by: Kennieth Rad, RN on: 10/03/2016 11:58 AM<BR>    Actions taken: Order list changed, Sign clinical note

## 2016-10-03 NOTE — Patient Instructions (Signed)
Increase your carvedilol to 6.25 (1 Tab) 2 Times Daily  Follow up in 3 months

## 2016-10-03 NOTE — Progress Notes (Signed)
Patient ID: Sara [name], female   DOB: 02/10/1986, 30 y.o.   MRN: [id number] PCP: Primary Cardiologist: Dr Haroldine Laws  HPI: Sara [name] is a 31 year old with history of DM, HTN, and 2 pregnancies complicated by preeclampsia. She has a 28 year old and 85 month old.    Admitted  March 30th with increased dsypnea and PND.  Prior  to admit she had a virus. CXR was concerning for CAP. Completed antibiotic course. ECHO performed and showed reduced EF -->35%. Diuresed with IV lasix. Instructed to stop breast feeding with addition of HF meds and to prevent pregnancy. Started on entresto, carvedilol and lasix. Discharge weight was 175 pounds.   cMRI 10/17: LVEF 37% no scarring or infiltrate  She returns today for regular follow up.  Continues to struggle with migraines. Not working currently. Taking care of 2 small children. Has been compliant with HF medications. Denies dyspnea. But does get fatigued in late afternoon. No edema, orrthopnea or PND. Checking BP in am 135/70. Weight at home about 180 pounds.  ECHO 04/2016 ~35-40%  ECHO 12/2015 ~30-35%.   CPX 7/17  FVC 2.68 (86%)    FEV1 2.43 (91%)       BP rest: 118/64 BP peak: 156/84 Peak VO2: 17.9 (63.9% predicted peak VO2) - corrects to 23.4 fo IBW VE/VCO2 slope: 31.2 OUES: 1.63 Peak RER: 1.17 Ventilatory Threshold: 13.9 (49.6% predicted or measured peak VO2) VE/MVV: 55.9% O2pulse: 10  (83% predicted O2pulse)  Labs 5/4 2017 K 4.3 Creatinine 0.71 BNP 54 Labs 01/27/2016: BNP 274   SH: Lives with her boyfriend and 2 children.   ROS: All systems negative except as listed in HPI, PMH and Problem List.  SH:  Social History   Social History  . Marital status: Divorced    Spouse name: N/A  . Number of children: 1  . Years of education: 73   Occupational History  . Not on file.   Social History Main Topics  . Smoking status: Never Smoker  . Smokeless tobacco: Never Used  . Alcohol use No     Comment: drank 1-2 drinks a week  before pregnancy  . Drug use: No  . Sexual activity: Yes   Other Topics Concern  . Not on file   Social History Narrative   Lives with boyfriend and child   Is pregnant (20weeks) with second child   1st born is a boy and her second is a boy   Drinks one cup of coffee a day    FH:  Family History  Problem Relation Age of Onset  . Diabetes Mother   . Diabetes Maternal Grandmother   . Hypertension Father   . Diabetes Paternal Grandmother   . Migraines Mother     Past Medical History:  Diagnosis Date  . Anxiety    diagnosed in 2007  . CHF (congestive heart failure) ([place])   . Diabetes mellitus    type 2 - uncontrolled  . Hypertension    benign essential  . Infected pilonidal cyst   . Migraine   . Non-reassuring fetal heart rate or rhythm affecting mother     Current Outpatient Prescriptions  Medication Sig Dispense Refill  . butalbital-acetaminophen-caffeine (FIORICET, ESGIC) 50-325-40 MG tablet Take 1 tablet by mouth every 4 (four) hours as needed for headache. 14 tablet 0  . carvedilol (COREG) 3.125 MG tablet Take 1 tablet (3.125 mg total) by mouth 2 (two) times daily with a meal. 60 tablet 6  . cetirizine (ZYRTEC) 10  MG tablet Take 10 mg by mouth daily as needed for allergies.     . furosemide (LASIX) 20 MG tablet Take 1 tablet (20 mg total) by mouth as needed (for weight gain). 30 tablet 3  . insulin aspart (NOVOLOG) 100 UNIT/ML injection Inject 10-14 Units into the skin once. Sliding scale as directed with largest meal    . insulin glargine (LANTUS) 100 UNIT/ML injection Inject 42 Units into the skin at bedtime.     . Insulin Pen Needle (1ST CHOICE PEN NEEDLES) 31G X 6 MM MISC Use with insulin pen 100 each 0  . ivabradine (CORLANOR) 5 MG TABS tablet Take 1 tablet (5 mg total) by mouth 2 (two) times daily with a meal. 60 tablet 5  . Multiple Vitamins-Minerals (MULTIVITAMIN WITH MINERALS) tablet Take 1 tablet by mouth daily.    . sacubitril-valsartan (ENTRESTO) 97-103  MG Take 1 tablet by mouth 2 (two) times daily.    . sitaGLIPtin (JANUVIA) 25 MG tablet Take 25 mg by mouth daily.    Marland Kitchen spironolactone (ALDACTONE) 25 MG tablet Take 12.5 mg by mouth daily.     No current facility-administered medications for this encounter.     Vitals:   10/03/16 1127  BP: 135/71  Pulse: 83  SpO2: 100%  Weight: 183 lb 6.4 oz (83.2 kg)   Filed Weights   10/03/16 1127  Weight: 183 lb 6.4 oz (83.2 kg)    PHYSICAL EXAM: General:  Well appearing. No resp difficulty. Ambulated in the clinic without difficulty.  HEENT: normal Neck: supple. JVP flat Carotids 2+ bilaterally; no bruits. No thyromegaly or nodule noted.  Cor: PMI normal. RRR. No M/G/R. No S3 Lungs: CTAB, normal effort Abdomen: soft, NT, ND, no HSM. No bruits or masses. +BS  Extremities: no cyanosis, clubbing, rash, no edema Neuro: alert & orientedx3, cranial nerves grossly intact. Moves all 4 extremities w/o difficulty. Affect pleasant.   ASSESSMENT & PLAN: 1. Chronic Systolic CHF- 99991111 EF 35%. - ? Viral Cardiomyopathy versus HTN. Also pre-eclampsia with both pregnancies.  ECHO (7/17) EF ->  35%. cMRI 10/17 EF 37% -Slightly improved. NYHA II-early III. Confirmed by CPX.  -Volume status stable on exam.  - Continue Entresto 97/103 mg BID.  - Continue spiro to 12.5 mg daily. - Increase carvedilol to 6.25 bid - Continue corlanor 5 bid - She is intolerant of carvedilol to 6.25 bid. Will decrease dose to try to improve tolerance with some ongoing daytime fatigue.  - Intolerant Bidil due to headache. Failed on 2 separate occasions.   - Encouraged regular execise 2 HTN  - Slightly elevated. Increasing carvedilol - Could consider hydralazine (no nitrates) or amlodipine in the future  3 DM-  Managed by PCP. Encouraged to follow-up 4 OB-  Norplant in place.     Bensimhon, Daniel,MD 11:43 AM

## 2016-10-06 ENCOUNTER — Encounter (HOSPITAL_COMMUNITY): Payer: Self-pay

## 2016-10-06 NOTE — Progress Notes (Signed)
Medical record request received via fax from [facility] for records 07/2016--present. All available records from CHF clinic/providers faxed t provided # [phone] (13 pages total). Copy of request scanned into patient's electronic medical record. Claim # [id number]  Renee Pain, RN

## 2016-10-10 NOTE — Telephone Encounter (Signed)
Open in error

## 2016-10-24 ENCOUNTER — Telehealth (HOSPITAL_COMMUNITY): Payer: Self-pay | Admitting: *Deleted

## 2016-10-24 ENCOUNTER — Telehealth (HOSPITAL_COMMUNITY): Payer: Self-pay

## 2016-10-24 NOTE — Telephone Encounter (Signed)
Will forward to Dr. Haroldine Laws to check for available dates/times he is able to do peer to peer and arrange with [facility] accordingly.  Renee Pain, RN

## 2016-10-24 NOTE — Telephone Encounter (Signed)
[place] with [employer] (third party group for disability) called to inform us that patients disability would require peer to peer review. She needs to know Dr.Bensimhons availability so that she can have one of their physicians available as well.  Case ref [id number]. I spoke with Jinny Blossom who is in charge of disability and she will contact [place].   **message routed to Vilinda Blanks (disabilty coordinator)**

## 2016-10-24 NOTE — Telephone Encounter (Signed)
Will forward to Dr. Haroldine Laws to check for available dates/times he is able to do peer to peer and arrange with [facility] accordingly.  Renee Pain, RN

## 2016-11-07 ENCOUNTER — Other Ambulatory Visit (HOSPITAL_COMMUNITY): Payer: Self-pay | Admitting: Adult Health

## 2016-11-07 DIAGNOSIS — I5022 Chronic systolic (congestive) heart failure: Secondary | ICD-10-CM

## 2016-12-07 ENCOUNTER — Encounter (HOSPITAL_COMMUNITY): Payer: Self-pay

## 2016-12-07 NOTE — Progress Notes (Signed)
[employer] Disability forms completed and signed by Dr. Haroldine Laws. Faxed to provided # [phone] Attn Heather Copy of forms scanned into patient's electronic medical record.

## 2016-12-10 ENCOUNTER — Other Ambulatory Visit (HOSPITAL_COMMUNITY): Payer: Self-pay | Admitting: Adult Health

## 2016-12-10 DIAGNOSIS — I5022 Chronic systolic (congestive) heart failure: Secondary | ICD-10-CM

## 2016-12-20 ENCOUNTER — Other Ambulatory Visit: Payer: Self-pay

## 2016-12-20 ENCOUNTER — Telehealth (HOSPITAL_COMMUNITY): Payer: Self-pay | Admitting: *Deleted

## 2016-12-20 ENCOUNTER — Encounter (HOSPITAL_COMMUNITY): Payer: Self-pay | Admitting: Emergency Medicine

## 2016-12-20 ENCOUNTER — Emergency Department (HOSPITAL_COMMUNITY): Payer: Medicaid Other

## 2016-12-20 ENCOUNTER — Emergency Department (HOSPITAL_COMMUNITY)
Admission: EM | Admit: 2016-12-20 | Discharge: 2016-12-20 | Disposition: A | Discharge status: Home / Self Care | Payer: Medicaid Other | Attending: Emergency Medicine | Admitting: Emergency Medicine

## 2016-12-20 DIAGNOSIS — I5022 Chronic systolic (congestive) heart failure: Secondary | ICD-10-CM | POA: Insufficient documentation

## 2016-12-20 DIAGNOSIS — E119 Type 2 diabetes mellitus without complications: Secondary | ICD-10-CM | POA: Insufficient documentation

## 2016-12-20 DIAGNOSIS — I110 Hypertensive heart disease with heart failure: Secondary | ICD-10-CM | POA: Insufficient documentation

## 2016-12-20 DIAGNOSIS — Z794 Long term (current) use of insulin: Secondary | ICD-10-CM | POA: Insufficient documentation

## 2016-12-20 DIAGNOSIS — Z91040 Latex allergy status: Secondary | ICD-10-CM | POA: Diagnosis not present

## 2016-12-20 DIAGNOSIS — R079 Chest pain, unspecified: Secondary | ICD-10-CM | POA: Diagnosis not present

## 2016-12-20 DIAGNOSIS — R600 Localized edema: Secondary | ICD-10-CM | POA: Diagnosis not present

## 2016-12-20 LAB — I-STAT TROPONIN, ED: Troponin i, poc: 0 ng/mL (ref 0.00–0.08)

## 2016-12-20 LAB — BRAIN NATRIURETIC PEPTIDE: B NATRIURETIC PEPTIDE 5: 14 pg/mL (ref 0.0–100.0)

## 2016-12-20 LAB — CBC
HCT: 37.3 % (ref 36.0–46.0)
Hemoglobin: 11.9 g/dL — ABNORMAL LOW (ref 12.0–15.0)
MCH: 26.4 pg (ref 26.0–34.0)
MCHC: 31.9 g/dL (ref 30.0–36.0)
MCV: 82.9 fL (ref 78.0–100.0)
PLATELETS: 352 10*3/uL (ref 150–400)
RBC: 4.5 MIL/uL (ref 3.87–5.11)
RDW: 13 % (ref 11.5–15.5)
WBC: 11.9 10*3/uL — AB (ref 4.0–10.5)

## 2016-12-20 LAB — BASIC METABOLIC PANEL
Anion gap: 9 (ref 5–15)
BUN: 5 mg/dL — AB (ref 6–20)
CO2: 24 mmol/L (ref 22–32)
CREATININE: 0.79 mg/dL (ref 0.44–1.00)
Calcium: 9.5 mg/dL (ref 8.9–10.3)
Chloride: 104 mmol/L (ref 101–111)
GFR calc Af Amer: >60 mL/min (ref >60)
Glucose, Bld: 336 mg/dL — ABNORMAL HIGH (ref 65–99)
Potassium: 4.2 mmol/L (ref 3.5–5.1)
SODIUM: 137 mmol/L (ref 135–145)

## 2016-12-20 NOTE — ED Provider Notes (Signed)
[place] DEPT Provider Note   CSN: [id number] Arrival date & time: 12/20/16  1945     History   Chief Complaint Chief Complaint  Patient presents with  . Chest Pain    HPI Sara [name] is a 31 y.o. female.  The history is provided by the patient and medical records.  Chest Pain   Associated symptoms include shortness of breath.    31 y.o. F with hx of anxiety, CHF with estimated 30-35% by 2D echo in 2017, DM2, HTN, migraine headaches, presenting to the ED for chest pain.  Patient reports over the weekend on Saturday (4 days ago) she had some aching, central chest pain.  States this resolved at home with some rest and relaxation.  She admits she is not sure if this was her anxiety vs CHF.  States this morning she had recurrent symptoms, this time more like a tightness in her central chest.  Symptoms began this morning, has remained pretty persistent throughout the day.  States he has been feeling somewhat SOB, more so with exertion.  States she has also had some mild edema of her ankles.  She denies any calf pain or swelling.  No cough or URI symptoms.  No sick contacts.  No fever or chills.  No hx of DVT or PE.  Has nexplanon in place-- no issues with this.  States she has been compliant with her medications. Followed by cardiology, Dr. Haroldine Laws.  No other cardiac issues.  Not a smoker.  Past Medical History:  Diagnosis Date  . Anxiety    diagnosed in 2007  . CHF (congestive heart failure) ([place])   . Diabetes mellitus    type 2 - uncontrolled  . Hypertension    benign essential  . Infected pilonidal cyst   . Migraine   . Non-reassuring fetal heart rate or rhythm affecting mother     Patient Active Problem List   Diagnosis Date Noted  . Chronic systolic CHF (congestive heart failure), NYHA class 3 ([place]) 02/09/2016  . HTN, goal below 130/80 02/09/2016  . Essential hypertension   . Breastfeeding (infant) 01/27/2016  . Abnormal EKG 01/27/2016  . Preterm spontaneous  labor with preterm delivery 04/02/2015  . Normal vaginal delivery 04/01/2015  . Preterm premature rupture of membranes (PPROM) with onset of labor within 24 hours of rupture in first trimester, antepartum 03/31/2015  . Pre-eclampsia in third trimester 03/16/2015  . Pre-existing type 2 diabetes mellitus in pregnancy in third trimester   . Preterm contractions 03/13/2015  . Diabetes mellitus with insulin therapy ([place]) 03/12/2015  . BMI 36.0-36.9,adult 03/12/2015  . Herpes simplex type 2 (HSV-2) infection affecting pregnancy, antepartum 03/12/2015  . Hemorrhoids 03/12/2015  . Latex allergy - rash 03/12/2015  . 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 History of migraine headaches - with aura 12/12/2014  . History of preterm delivery, currently pregnant 12/05/2014  . Generalized anxiety disorder 12/05/2014  . Anemia 12/05/2014  . Pilonidal abscess 09/30/2012    Past Surgical History:  Procedure Laterality Date  . NO PAST SURGERIES      OB History    Gravida Para Term Preterm AB Living   3 2   2 1 1    SAB TAB Ectopic Multiple Live Births   1     0 1       Home Medications    Prior to Admission medications   Medication Sig Start Date  End Date Taking? Authorizing Provider  acetaminophen (TYLENOL) 500 MG tablet Take 500-1,000 mg by mouth every 6 (six) hours as needed (for pain or toothaches).   Yes Historical Provider, MD  butalbital-acetaminophen-caffeine (FIORICET, ESGIC) 50-325-40 MG tablet Take 1 tablet by mouth every 4 (four) hours as needed for headache. 02/02/16  Yes Theodis Blaze, MD  carvedilol (COREG) 6.25 MG tablet Take 1 tablet (6.25 mg total) by mouth 2 (two) times daily with a meal. 10/03/16  Yes Jolaine Artist, MD  cetirizine (ZYRTEC) 10 MG tablet Take 10 mg by mouth daily as needed for allergies.    Yes Historical Provider, MD  furosemide (LASIX) 20 MG tablet Take 1 tablet (20 mg total) by mouth as  needed (for weight gain). 06/26/16 10/03/17 Yes Shaune Pascal Bensimhon, MD  insulin aspart (NOVOLOG) 100 UNIT/ML injection Inject 14 Units into the skin See admin instructions. With the largest meal of the day   Yes Historical Provider, MD  insulin glargine (LANTUS) 100 UNIT/ML injection Inject 44 Units into the skin at bedtime.    Yes Historical Provider, MD  Insulin Pen Needle (1ST CHOICE PEN NEEDLES) 31G X 6 MM MISC Use with insulin pen 08/08/14  Yes Harden Mo, MD  ivabradine (CORLANOR) 5 MG TABS tablet Take 1 tablet (5 mg total) by mouth 2 (two) times daily with a meal. 08/30/16  Yes Jolaine Artist, MD  sacubitril-valsartan (ENTRESTO) 97-103 MG Take 1 tablet by mouth 2 (two) times daily.   Yes Historical Provider, MD  sitaGLIPtin (JANUVIA) 100 MG tablet Take 100 mg by mouth daily.   Yes Historical Provider, MD  spironolactone (ALDACTONE) 25 MG tablet TAKE 1/2 TABLET(12.5 MG) BY MOUTH DAILY 12/11/16  Yes Jolaine Artist, MD    Family History Family History  Problem Relation Age of Onset  . Diabetes Mother   . Migraines Mother   . Hypertension Father   . Diabetes Maternal Grandmother   . Diabetes Paternal Grandmother     Social History Social History  Substance Use Topics  . Smoking status: Never Smoker  . Smokeless tobacco: Never Used  . Alcohol use No     Comment: drank 1-2 drinks a week before pregnancy     Allergies   Septra ds [sulfamethoxazole-trimethoprim]; Bidil [isosorb dinitrate-hydralazine]; Lactose intolerance (gi); Lobster [shellfish allergy]; Latex; and Tape   Review of Systems Review of Systems  Respiratory: Positive for shortness of breath.   Cardiovascular: Positive for chest pain.  All other systems reviewed and are negative.    Physical Exam Updated Vital Signs BP 157/96 (BP Location: Right Arm)   Pulse 85   Temp 98 F (36.7 C) (Oral)   Resp 18   Ht 5\' 2"  (1.575 m)   Wt 82.6 kg   SpO2 100%   BMI 33.29 kg/m   Physical Exam    Constitutional: She is oriented to person, place, and time. She appears well-developed and well-nourished.  HENT:  Head: Normocephalic and atraumatic.  Mouth/Throat: Oropharynx is clear and moist.  Eyes: Conjunctivae and EOM are normal. Pupils are equal, round, and reactive to light.  Neck: Normal range of motion.  Cardiovascular: Normal rate, regular rhythm and normal heart sounds.   Pulmonary/Chest: Effort normal and breath sounds normal. No respiratory distress. She has no wheezes.  Lungs clear, no distress, speaking in full sentences without difficulty  Abdominal: Soft. Bowel sounds are normal. There is no tenderness. There is no rebound.  Musculoskeletal: Normal range of motion.  Trace edema at  the ankles  Neurological: She is alert and oriented to person, place, and time.  Skin: Skin is warm and dry.  Psychiatric: She has a normal mood and affect.  Nursing note and vitals reviewed.    ED Treatments / Results  Labs (all labs ordered are listed, but only abnormal results are displayed) Labs Reviewed  BASIC METABOLIC PANEL - Abnormal; Notable for the following:       Result Value   Glucose, Bld 336 (*)    BUN 5 (*)    All other components within normal limits  CBC - Abnormal; Notable for the following:    WBC 11.9 (*)    Hemoglobin 11.9 (*)    All other components within normal limits  BRAIN NATRIURETIC PEPTIDE  I-STAT TROPOININ, ED    EKG  EKG Interpretation  Date/Time:  Wednesday December 20 2016 19:50:52 EST Ventricular Rate:  86 PR Interval:  154 QRS Duration: 90 QT Interval:  372 QTC Calculation: 445 R Axis:   70 Text Interpretation:  Normal sinus rhythm Normal ECG No significant change since last tracing Confirmed by FLOYD MD, DANIEL [phone]) on 12/20/2016 9:34:12 PM       Radiology Dg Chest 2 View  Result Date: 12/20/2016 CLINICAL DATA:  Chest pain and tightness EXAM: CHEST  2 VIEW COMPARISON:  03/02/2016 FINDINGS: No acute pulmonary infiltrate,  consolidation or effusion. Stable borderline heart size. Normal vascularity. No pneumothorax. IMPRESSION: No active cardiopulmonary disease. Electronically Signed   By: Donavan Foil M.D.   On: 12/20/2016 20:22    Procedures Procedures (including critical care time)  Medications Ordered in ED Medications - No data to display   Initial Impression / Assessment and Plan / ED Course  I have reviewed the triage vital signs and the nursing notes.  Pertinent labs & imaging results that were available during my care of the patient were reviewed by me and considered in my medical decision making (see chart for details).  31 y.o. F here with chest pain.  Began this morning, described as tightness.  EKG here is non-ischemic.  Lab work reassuring-- glucose elevated but patient admits eating dinner just PTA.  VSS.  No tachycardia, hypoxia, calf swelling/tenderness, or pleuritic chest pain to suggest PE.  Given symptoms ongoing for >8+ hours by time of evaluation with negative troponin, do not suspect ACS.  Feel patient can be discharged home.  She has previously scheduled follow-up with her cardiologist on 01/10/17-- can follow-up sooner if need be.  Can also follow-up with PCP.  Discussed plan with patient, she acknowledged understanding and agreed with plan of care.  Return precautions given for new or worsening symptoms.  Final Clinical Impressions(s) / ED Diagnoses   Final diagnoses:  Chest pain, unspecified type    New Prescriptions Discharge Medication List as of 12/20/2016 11:24 PM       Larene Pickett, PA-C 12/21/16 Ray, DO 12/21/16 1348

## 2016-12-20 NOTE — ED Triage Notes (Signed)
Pt having cp 6/10 described as tightness on her left side started this morning, hx of CHF. Denies fever, chills, nausea or vomiting.

## 2016-12-20 NOTE — ED Notes (Signed)
Pt stable, ambulatory, states understanding of discharge instructions 

## 2016-12-20 NOTE — Telephone Encounter (Signed)
Pt called to report she has been having chest tightness all day.  She also c/o increased fatigue for about a week and slight increase in sob w/exertion.  She states her wt is stable, was 184 yesterday, 1818.5 today which is in her normal range.  BP was 131/60 this AM and 135/77 this afternoon.  Discussed w/Andy Chalmers Cater, PA he advised pt should report to ER for further eval.  Spoke w/pt, she is aware and agreeable.

## 2016-12-20 NOTE — ED Notes (Signed)
Lab to add on BNP.  °

## 2016-12-20 NOTE — Discharge Instructions (Signed)
Continue your home medications as directed. Follow-up with your cardiologist as scheduled.  I would let the office know you were seen here today so that Dr. B can review your labs. Return here for any new/worsening symptoms.

## 2017-01-04 ENCOUNTER — Other Ambulatory Visit: Payer: Self-pay | Admitting: Student

## 2017-01-10 ENCOUNTER — Ambulatory Visit (HOSPITAL_COMMUNITY)
Admission: RE | Admit: 2017-01-10 | Discharge: 2017-01-10 | Disposition: A | Discharge status: Home / Self Care | Payer: Medicaid Other | Source: Ambulatory Visit | Attending: Internal Medicine | Admitting: Internal Medicine

## 2017-01-10 ENCOUNTER — Encounter (HOSPITAL_COMMUNITY): Payer: Self-pay | Admitting: *Deleted

## 2017-01-10 ENCOUNTER — Encounter (HOSPITAL_COMMUNITY): Payer: Self-pay | Admitting: Internal Medicine

## 2017-01-10 VITALS — BP 134/78 | HR 81 | Wt 187.5 lb

## 2017-01-10 DIAGNOSIS — I10 Essential (primary) hypertension: Secondary | ICD-10-CM | POA: Diagnosis not present

## 2017-01-10 DIAGNOSIS — F419 Anxiety disorder, unspecified: Secondary | ICD-10-CM | POA: Insufficient documentation

## 2017-01-10 DIAGNOSIS — Z833 Family history of diabetes mellitus: Secondary | ICD-10-CM | POA: Insufficient documentation

## 2017-01-10 DIAGNOSIS — I5022 Chronic systolic (congestive) heart failure: Secondary | ICD-10-CM | POA: Insufficient documentation

## 2017-01-10 DIAGNOSIS — I110 Hypertensive heart disease with heart failure: Secondary | ICD-10-CM | POA: Insufficient documentation

## 2017-01-10 DIAGNOSIS — E119 Type 2 diabetes mellitus without complications: Secondary | ICD-10-CM | POA: Insufficient documentation

## 2017-01-10 DIAGNOSIS — Z8249 Family history of ischemic heart disease and other diseases of the circulatory system: Secondary | ICD-10-CM | POA: Insufficient documentation

## 2017-01-10 DIAGNOSIS — Z79899 Other long term (current) drug therapy: Secondary | ICD-10-CM | POA: Diagnosis not present

## 2017-01-10 DIAGNOSIS — R5383 Other fatigue: Secondary | ICD-10-CM | POA: Insufficient documentation

## 2017-01-10 DIAGNOSIS — Z794 Long term (current) use of insulin: Secondary | ICD-10-CM | POA: Insufficient documentation

## 2017-01-10 MED ORDER — SPIRONOLACTONE 25 MG PO TABS: 25.0000 mg | ORAL_TABLET | Freq: Every day | ORAL | 3 refills | Status: DC

## 2017-01-10 MED ORDER — IVABRADINE HCL 7.5 MG PO TABS: 7.5000 mg | ORAL_TABLET | Freq: Two times a day (BID) | ORAL | 3 refills | Status: DC

## 2017-01-10 NOTE — Progress Notes (Signed)
[facility] CLINIC NOTE  Patient ID: Sara [name], female   DOB: 28-Nov-1985, 28 y.o.   MRN: [id number] PCP: Primary Cardiologist: Dr Haroldine Laws  HPI: Sara [name] is a 31 year old with history of DM, HTN, and 2 pregnancies complicated by preeclampsia.    Admitted  March 2017 with increased dsypnea and PND.  Prior  to admit she had a virus. CXR was concerning for CAP. Completed antibiotic course. ECHO performed and showed reduced EF -->35%. Diuresed with IV lasix. Instructed to stop breast feeding with addition of HF meds and to prevent pregnancy. Started on entresto, carvedilol and lasix. Discharge weight was 175 pounds.   cMRI 10/17: LVEF 37% no scarring or infiltrate  She returns today for regular follow up. Seen in ER in 2/18 with CP. Troponin normal. BNP 14.  BP 158/96. At last visit increased carvedilol to 6.25 bid. Feels ok. Very tired at times. Can do all ADLs without too much problem. SOB with steps. No orthopnea, PND or edema. BP labile at home - now not checking it as much. Has been compliant with HF medications. Husband denies significant snoring.   cMRI 10/17 EF 37% RV normal ECHO 04/2016 ~35-40%  ECHO 12/2015 ~30-35%.    CPX 7/17  FVC 2.68 (86%)    FEV1 2.43 (91%)       BP rest: 118/64 BP peak: 156/84 Peak VO2: 17.9 (63.9% predicted peak VO2) - corrects to 23.4 fo IBW VE/VCO2 slope: 31.2 OUES: 1.63 Peak RER: 1.17 Ventilatory Threshold: 13.9 (49.6% predicted or measured peak VO2) VE/MVV: 55.9% O2pulse: 10  (83% predicted O2pulse)  Labs 5/4 2017 K 4.3 Creatinine 0.71 BNP 54 Labs 01/27/2016: BNP 274   SH: Lives with her boyfriend and 2 children.   ROS: All systems negative except as listed in HPI, PMH and Problem List.  SH:  Social History   Social History  . Marital status: Divorced    Spouse name: N/A  . Number of children: 1  . Years of education: 58   Occupational History  . Not on file.   Social History Main Topics  . Smoking status: Never  Smoker  . Smokeless tobacco: Never Used  . Alcohol use No     Comment: drank 1-2 drinks a week before pregnancy  . Drug use: No  . Sexual activity: Yes   Other Topics Concern  . Not on file   Social History Narrative   Lives with boyfriend and child   Is pregnant (20weeks) with second child   1st born is a boy and her second is a boy   Drinks one cup of coffee a day    FH:  Family History  Problem Relation Age of Onset  . Diabetes Mother   . Migraines Mother   . Hypertension Father   . Diabetes Maternal Grandmother   . Diabetes Paternal Grandmother     Past Medical History:  Diagnosis Date  . Anxiety    diagnosed in 2007  . CHF (congestive heart failure) ([place])   . Diabetes mellitus    type 2 - uncontrolled  . Hypertension    benign essential  . Infected pilonidal cyst   . Migraine   . Non-reassuring fetal heart rate or rhythm affecting mother     Current Outpatient Prescriptions  Medication Sig Dispense Refill  . acetaminophen (TYLENOL) 500 MG tablet Take 500-1,000 mg by mouth every 6 (six) hours as needed (for pain or toothaches).    . butalbital-acetaminophen-caffeine (FIORICET, ESGIC) 50-325-40 MG  tablet Take 1 tablet by mouth every 4 (four) hours as needed for headache. 14 tablet 0  . carvedilol (COREG) 6.25 MG tablet Take 1 tablet (6.25 mg total) by mouth 2 (two) times daily with a meal. 60 tablet 3  . cetirizine (ZYRTEC) 10 MG tablet Take 10 mg by mouth daily as needed for allergies.     Marland Kitchen ENTRESTO 97-103 MG TAKE 1 TABLET BY MOUTH TWICE DAILY 180 tablet 3  . insulin aspart (NOVOLOG) 100 UNIT/ML injection Inject 14 Units into the skin See admin instructions. With the largest meal of the day    . insulin glargine (LANTUS) 100 UNIT/ML injection Inject 44 Units into the skin at bedtime.     . Insulin Pen Needle (1ST CHOICE PEN NEEDLES) 31G X 6 MM MISC Use with insulin pen 100 each 0  . ivabradine (CORLANOR) 5 MG TABS tablet Take 1 tablet (5 mg total) by mouth 2  (two) times daily with a meal. 60 tablet 5  . sitaGLIPtin (JANUVIA) 100 MG tablet Take 100 mg by mouth daily.    Marland Kitchen spironolactone (ALDACTONE) 25 MG tablet TAKE 1/2 TABLET(12.5 MG) BY MOUTH DAILY 15 tablet 11  . furosemide (LASIX) 20 MG tablet Take 1 tablet (20 mg total) by mouth as needed (for weight gain). (Patient not taking: Reported on 01/10/2017) 30 tablet 3   No current facility-administered medications for this encounter.     Vitals:   01/10/17 1352  BP: 134/78  Pulse: 81  SpO2: 100%  Weight: 187 lb 8 oz (85 kg)   Filed Weights   01/10/17 1352  Weight: 187 lb 8 oz (85 kg)    PHYSICAL EXAM: General:  Well appearing. No resp difficulty. Ambulated in the clinic without difficulty. Husband and son present.   HEENT: normal Neck: supple. JVP flat Carotids 2+ bilaterally; no bruits. No thyromegaly or nodule noted.  Cor: PMI normal. RRR. No M/G/R. No S3 Lungs: CTAB, normal effort Abdomen: soft, NT, ND, no HSM. No bruits or masses. +BS  Extremities: no cyanosis, clubbing, rash, no edema Neuro: alert & orientedx3, cranial nerves grossly intact. Moves all 4 extremities w/o difficulty. Affect pleasant.   ASSESSMENT & PLAN: 1. Chronic Systolic CHF- 2/13/08MVHQ EF 35%. - ? Viral Cardiomyopathy versus HTN. Also pre-eclampsia with both pregnancies.  ECHO (7/17) EF ->  35%. cMRI 10/17 EF 37% - Stable NYHA II-early III. Confirmed by CPX.  -Volume status stable on exam.  - Continue Entresto 97/103 mg BID.  - Increase spiro to 25 mg daily. BMET 10 days - Continue carvedilol to 6.25 bid - Continue corlanor increase 7.5 bid - She is intolerant of carvedilol more than 6.25 bid due to fatigue - Intolerant Bidil due to headache. Failed on 2 separate occasions.   - Repeat echo in 2 months 2 HTN  - Slightly elevated. Increasing spiro - Could consider hydralazine (no nitrates) or amlodipine in the future  3 DM-  Poorly controlled. Managed by PCP. Encouraged to follow-up 4  OB-  Norplant  in place.   5. Fatigue - Refer for sleep study   [name],MD 2:05 PM

## 2017-01-10 NOTE — Patient Instructions (Signed)
Increase Spironolactone to 25 mg (1 Tablet) Once Daily  Increase Corlanor to 7.5 mg (1 Tablet) Two times Daily with meals  Sleep Study has been ordered for you.  Labs in 10 days (BMET)  Follow up in 2 Months

## 2017-01-10 NOTE — Addendum Note (Signed)
Encounter addended by: Kennieth Rad, RN on: 01/10/2017  2:29 PM<BR>    Actions taken: Order list changed, Diagnosis association updated, Sign clinical note

## 2017-01-18 ENCOUNTER — Telehealth (HOSPITAL_COMMUNITY): Payer: Self-pay | Admitting: *Deleted

## 2017-01-18 NOTE — Telephone Encounter (Signed)
Paperwork from [employer] completed and signed by Dr Haroldine Laws, left at front desk for pt to p/u tomorrow.  Copies sent to med rec to be scanned into her chart

## 2017-01-19 ENCOUNTER — Ambulatory Visit (HOSPITAL_COMMUNITY)
Admission: RE | Admit: 2017-01-19 | Discharge: 2017-01-19 | Disposition: A | Discharge status: Home / Self Care | Payer: Medicaid Other | Source: Ambulatory Visit | Attending: Internal Medicine | Admitting: Internal Medicine

## 2017-01-19 DIAGNOSIS — I5022 Chronic systolic (congestive) heart failure: Secondary | ICD-10-CM | POA: Diagnosis not present

## 2017-01-19 LAB — BASIC METABOLIC PANEL
ANION GAP: 7 (ref 5–15)
BUN: 7 mg/dL (ref 6–20)
CHLORIDE: 102 mmol/L (ref 101–111)
CO2: 27 mmol/L (ref 22–32)
Calcium: 10 mg/dL (ref 8.9–10.3)
Creatinine, Ser: 0.81 mg/dL (ref 0.44–1.00)
GFR calc Af Amer: >60 mL/min (ref >60)
GFR calc non Af Amer: >60 mL/min (ref >60)
GLUCOSE: 253 mg/dL — AB (ref 65–99)
POTASSIUM: 4.3 mmol/L (ref 3.5–5.1)
Sodium: 136 mmol/L (ref 135–145)

## 2017-03-07 ENCOUNTER — Ambulatory Visit (HOSPITAL_BASED_OUTPATIENT_CLINIC_OR_DEPARTMENT_OTHER): Payer: Medicaid Other | Attending: Internal Medicine | Admitting: Cardiology

## 2017-03-07 DIAGNOSIS — Z6834 Body mass index (BMI) 34.0-34.9, adult: Secondary | ICD-10-CM | POA: Insufficient documentation

## 2017-03-07 DIAGNOSIS — E119 Type 2 diabetes mellitus without complications: Secondary | ICD-10-CM | POA: Insufficient documentation

## 2017-03-07 DIAGNOSIS — I493 Ventricular premature depolarization: Secondary | ICD-10-CM | POA: Diagnosis not present

## 2017-03-07 DIAGNOSIS — I110 Hypertensive heart disease with heart failure: Secondary | ICD-10-CM | POA: Insufficient documentation

## 2017-03-07 DIAGNOSIS — R5383 Other fatigue: Secondary | ICD-10-CM | POA: Insufficient documentation

## 2017-03-07 DIAGNOSIS — I5022 Chronic systolic (congestive) heart failure: Secondary | ICD-10-CM

## 2017-03-07 DIAGNOSIS — I509 Heart failure, unspecified: Secondary | ICD-10-CM | POA: Insufficient documentation

## 2017-03-07 DIAGNOSIS — E669 Obesity, unspecified: Secondary | ICD-10-CM | POA: Diagnosis not present

## 2017-03-07 DIAGNOSIS — R0683 Snoring: Secondary | ICD-10-CM | POA: Diagnosis not present

## 2017-03-18 NOTE — Procedures (Signed)
   Patient Name: Coriana, Angello Date: 03/07/2017 Gender: Female D.O.B: 29-Nov-1985 Age (years): 30 Referring Provider: Shaune Pascal Bensimhon Height (inches): 62 Interpreting Physician: Fransico Him MD, ABSM Weight (lbs): 186 RPSGT: Carolin Coy BMI: 34 MRN: [id number] Neck Size: 14.50  CLINICAL INFORMATION Sleep Study Type: NPSG  Indication for sleep study: Congestive Heart Failure, Diabetes, Fatigue, Hypertension, Obesity  Epworth Sleepiness Score: 12  SLEEP STUDY TECHNIQUE As per the AASM Manual for the Scoring of Sleep and Associated Events v2.3 (April 2016) with a hypopnea requiring 4% desaturations.  The channels recorded and monitored were frontal, central and occipital EEG, electrooculogram (EOG), submentalis EMG (chin), nasal and oral airflow, thoracic and abdominal wall motion, anterior tibialis EMG, snore microphone, electrocardiogram, and pulse oximetry.  MEDICATIONS Medications self-administered by patient taken the night of the study : N/A  SLEEP ARCHITECTURE The study was initiated at 10:09:06 PM and ended at 4:33:54 AM.  Sleep onset time was 38.7 minutes and the sleep efficiency was 87.7%. The total sleep time was 337.6 minutes.  Stage REM latency was 66.0 minutes.  The patient spent 12.44% of the night in stage N1 sleep, 71.71% in stage N2 sleep, 0.00% in stage N3 and 15.85% in REM.  Alpha intrusion was absent.  Supine sleep was 71.75%.  RESPIRATORY PARAMETERS The overall apnea/hypopnea index (AHI) was 0.7 per hour. There were 0 total apneas, including 0 obstructive, 0 central and 0 mixed apneas. There were 4 hypopneas and 26 RERAs.  The AHI during Stage REM sleep was 0.0 per hour.  AHI while supine was 1.0 per hour.  The mean oxygen saturation was 97.49%. The minimum SpO2 during sleep was 92.00%.  Soft snoring was noted during this study.  CARDIAC DATA The 2 lead EKG demonstrated sinus rhythm. The mean heart rate was 78.90 beats per  minute. Other EKG findings include: PVCs and bigeminal PVCs.  LEG MOVEMENT DATA The total PLMS were 0 with a resulting PLMS index of 0.00. Associated arousal with leg movement index was 0.0 .  IMPRESSIONS - No significant obstructive sleep apnea occurred during this study (AHI = 0.7/h). - No significant central sleep apnea occurred during this study (CAI = 0.0/h). - The patient had minimal or no oxygen desaturation during the study (Min O2 = 92.00%) - The patient snored with Soft snoring volume. - EKG findings include PVCs and bigeminal PVCs. - Clinically significant periodic limb movements did not occur during sleep. No significant associated arousals.  DIAGNOSIS - Normal study - PVCs  RECOMMENDATIONS - Avoid alcohol, sedatives and other CNS depressants that may worsen sleep apnea and disrupt normal sleep architecture. - Sleep hygiene should be reviewed to assess factors that may improve sleep quality. - Weight management and regular exercise should be initiated or continued if appropriate.  [place], American Board of Sleep Medicine  ELECTRONICALLY SIGNED ON:  03/18/2017, 9:30 PM [place] PH: [phone]   FX: [phone] [place]

## 2017-03-20 ENCOUNTER — Telehealth: Payer: Self-pay | Admitting: *Deleted

## 2017-03-20 NOTE — Telephone Encounter (Signed)
-----   Message from Sueanne Margarita, MD sent at 03/18/2017  9:34 PM EDT ----- Please let patient know that sleep study showed no significant sleep apnea.

## 2017-03-20 NOTE — Telephone Encounter (Signed)
Informed patient of sleep study results and patient understanding was verbalized. Patient understands that no further appointments are needed at this time. Patient was grateful for the call

## 2017-03-21 ENCOUNTER — Encounter (HOSPITAL_COMMUNITY): Payer: Medicaid Other | Admitting: Internal Medicine

## 2017-04-09 ENCOUNTER — Telehealth: Payer: Self-pay | Admitting: Physician Assistant

## 2017-04-09 ENCOUNTER — Other Ambulatory Visit: Payer: Self-pay | Admitting: Physician Assistant

## 2017-04-09 MED ORDER — LOSARTAN POTASSIUM 100 MG PO TABS: 100.0000 mg | ORAL_TABLET | Freq: Every day | ORAL | 0 refills | Status: DC

## 2017-04-09 NOTE — Telephone Encounter (Signed)
    Patient ran out [place] 97-103mg  daily for 2-3 days and waiting for prior auth to be processed before she can get it refilled. She called on call provider today to report a BP of 174/117. I have called in a 30 day supply of Losartan 100mg  daily to take until she can get her Entresto refilled. I explained that when she gets the [facility], she needs to STOP taking the Losartan. She verbalized understanding.   Angelena Form PA-C  [facility]

## 2017-04-09 NOTE — Progress Notes (Signed)
    Patient ran out [place] 97-103mg  daily for 2-3 days and waiting for prior auth to be processed before she can get it refilled. She called on call provider today to report a BP of 174/117. I have called in a 30 day supply of Losartan 100mg  daily to take until she can get her Entresto refilled. I explained that when she gets the [facility], she needs to STOP taking the Losartan. She verbalized understanding.   Angelena Form PA-C  [facility]

## 2017-04-11 ENCOUNTER — Telehealth (HOSPITAL_COMMUNITY): Payer: Self-pay | Admitting: *Deleted

## 2017-04-11 ENCOUNTER — Encounter (HOSPITAL_COMMUNITY): Payer: Self-pay | Admitting: *Deleted

## 2017-04-11 ENCOUNTER — Emergency Department (HOSPITAL_COMMUNITY)
Admission: EM | Admit: 2017-04-11 | Discharge: 2017-04-11 | Disposition: A | Discharge status: Home / Self Care | Payer: Medicaid Other | Attending: Emergency Medicine | Admitting: Emergency Medicine

## 2017-04-11 DIAGNOSIS — I110 Hypertensive heart disease with heart failure: Secondary | ICD-10-CM | POA: Diagnosis not present

## 2017-04-11 DIAGNOSIS — Z91040 Latex allergy status: Secondary | ICD-10-CM | POA: Insufficient documentation

## 2017-04-11 DIAGNOSIS — E119 Type 2 diabetes mellitus without complications: Secondary | ICD-10-CM | POA: Insufficient documentation

## 2017-04-11 DIAGNOSIS — Z794 Long term (current) use of insulin: Secondary | ICD-10-CM | POA: Diagnosis not present

## 2017-04-11 DIAGNOSIS — I5022 Chronic systolic (congestive) heart failure: Secondary | ICD-10-CM | POA: Insufficient documentation

## 2017-04-11 DIAGNOSIS — H538 Other visual disturbances: Secondary | ICD-10-CM | POA: Diagnosis not present

## 2017-04-11 DIAGNOSIS — I10 Essential (primary) hypertension: Secondary | ICD-10-CM | POA: Diagnosis present

## 2017-04-11 DIAGNOSIS — Z7984 Long term (current) use of oral hypoglycemic drugs: Secondary | ICD-10-CM | POA: Diagnosis not present

## 2017-04-11 LAB — I-STAT BETA HCG BLOOD, ED (MC, WL, AP ONLY)

## 2017-04-11 LAB — BASIC METABOLIC PANEL
Anion gap: 10 (ref 5–15)
BUN: 9 mg/dL (ref 6–20)
CALCIUM: 9.3 mg/dL (ref 8.9–10.3)
CO2: 24 mmol/L (ref 22–32)
CREATININE: 0.78 mg/dL (ref 0.44–1.00)
Chloride: 101 mmol/L (ref 101–111)
Glucose, Bld: 323 mg/dL — ABNORMAL HIGH (ref 65–99)
Potassium: 4.2 mmol/L (ref 3.5–5.1)
SODIUM: 135 mmol/L (ref 135–145)

## 2017-04-11 LAB — CBC
HCT: 38.4 % (ref 36.0–46.0)
Hemoglobin: 12 g/dL (ref 12.0–15.0)
MCH: 26.5 pg (ref 26.0–34.0)
MCHC: 31.3 g/dL (ref 30.0–36.0)
MCV: 85 fL (ref 78.0–100.0)
PLATELETS: 348 10*3/uL (ref 150–400)
RBC: 4.52 MIL/uL (ref 3.87–5.11)
RDW: 13.1 % (ref 11.5–15.5)
WBC: 13.8 10*3/uL — AB (ref 4.0–10.5)

## 2017-04-11 NOTE — Discharge Instructions (Signed)
Cardiology team will be working on figuring out your medications. They informed us that entresto is one of the newer, more expensive medications - so it does take longer for approval. Until then, they asked for simply losartan as prescribed.

## 2017-04-11 NOTE — ED Triage Notes (Signed)
Pt states she has not taken her Entresto x 5 days d/t lack of prior authorization.  She was getting a headache, so checked her bp and it was sbp of 170 (148/90 after taking losartan).  States intermittent blurred vision.  Denies sob, though states 3 lb weight gain since last week.

## 2017-04-11 NOTE — ED Notes (Signed)
Pt verbalized understanding to continue taking cozaar until her other BP medication is approved by insurance. VSS. NAD. Pt to follow up with cardiology.

## 2017-04-11 NOTE — Telephone Encounter (Signed)
Pt called c/o elevated bp with headache and blurred vision.  Today her bp was 162/99. She has been taking losartan 100mg  daily as directed. Per Nira Conn I instructed patient to go to the Emergency Room for evaluation.

## 2017-04-12 ENCOUNTER — Telehealth (HOSPITAL_COMMUNITY): Payer: Self-pay | Admitting: Vascular Surgery

## 2017-04-12 ENCOUNTER — Telehealth (HOSPITAL_COMMUNITY): Payer: Self-pay | Admitting: Pharmacist

## 2017-04-12 NOTE — ED Provider Notes (Signed)
[facility] DEPT Provider Note   CSN: [id number] Arrival date & time: 04/11/17  1430     History   Chief Complaint Chief Complaint  Patient presents with  . Hypertension  . Blurred Vision    HPI Sara [name] is a 31 y.o. female.  HPI Pt comes in with main complains of DIB and elevated HTN. Pt has hx of NICM with EF of 30%. She was on entresto, but hasn't been able to fill that for a week due to pending authorization. Pt reports that she started having some migraine headache and blurry vision with it - she checked her BP and it was as high as 170s, so she came to the ER. Pt was started on losartan while entresto authorization is pending. Pt has no orthopnea, PND, new leg swelling.  Past Medical History:  Diagnosis Date  . Anxiety    diagnosed in 2007  . CHF (congestive heart failure) ([place])   . Diabetes mellitus    type 2 - uncontrolled  . Hypertension    benign essential  . Infected pilonidal cyst   . Migraine   . Non-reassuring fetal heart rate or rhythm affecting mother     Patient Active Problem List   Diagnosis Date Noted  . Chronic systolic CHF (congestive heart failure), NYHA class 3 ([place]) 02/09/2016  . HTN, goal below 130/80 02/09/2016  . Essential hypertension   . Breastfeeding (infant) 01/27/2016  . Abnormal EKG 01/27/2016  . Preterm spontaneous labor with preterm delivery 04/02/2015  . Normal vaginal delivery 04/01/2015  . Preterm premature rupture of membranes (PPROM) with onset of labor within 24 hours of rupture in first trimester, antepartum 03/31/2015  . Pre-eclampsia in third trimester 03/16/2015  . Pre-existing type 2 diabetes mellitus in pregnancy in third trimester   . Preterm contractions 03/13/2015  . Diabetes mellitus with insulin therapy ([place]) 03/12/2015  . BMI 36.0-36.9,adult 03/12/2015  . Herpes simplex type 2 (HSV-2) infection affecting pregnancy, antepartum 03/12/2015  . Hemorrhoids 03/12/2015  . Latex allergy - rash 03/12/2015    . 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 History of migraine headaches - with aura 12/12/2014  . History of preterm delivery, currently pregnant 12/05/2014  . Generalized anxiety disorder 12/05/2014  . Anemia 12/05/2014  . Pilonidal abscess 09/30/2012    Past Surgical History:  Procedure Laterality Date  . NO PAST SURGERIES      OB History    Gravida Para Term Preterm AB Living   3 2   2 1 1    SAB TAB Ectopic Multiple Live Births   1     0 1       Home Medications    Prior to Admission medications   Medication Sig Start Date End Date Taking? Authorizing Provider  acetaminophen (TYLENOL) 500 MG tablet Take 500-1,000 mg by mouth every 6 (six) hours as needed (for pain or toothaches).   Yes [provider]  butalbital-acetaminophen-caffeine (FIORICET, ESGIC) 50-325-40 MG tablet Take 1 tablet by mouth every 4 (four) hours as needed for headache. 02/02/16  Yes Theodis Blaze, MD  carvedilol (COREG) 6.25 MG tablet Take 1 tablet (6.25 mg total) by mouth 2 (two) times daily with a meal. 10/03/16  Yes Bensimhon, Shaune Pascal, MD  ENTRESTO 97-103 MG TAKE 1 TABLET BY MOUTH TWICE DAILY 01/04/17  Yes Bensimhon, Shaune Pascal, MD  etonogestrel (NEXPLANON) 68 MG IMPL implant 1 each by Subdermal route  once.   Yes [provider]  furosemide (LASIX) 20 MG tablet Take 1 tablet (20 mg total) by mouth as needed (for weight gain). 06/26/16 10/03/17 Yes Bensimhon, Shaune Pascal, MD  insulin aspart (NOVOLOG) 100 UNIT/ML injection Inject 22 Units into the skin every evening. With the largest meal of the day   Yes [provider]  insulin glargine (LANTUS) 100 UNIT/ML injection Inject 44 Units into the skin at bedtime.    Yes [provider]  Insulin Pen Needle (1ST CHOICE PEN NEEDLES) 31G X 6 MM MISC Use with insulin pen 08/08/14  Yes Harden Mo, MD  ivabradine (CORLANOR) 7.5 MG TABS tablet Take 1 tablet (7.5 mg  total) by mouth 2 (two) times daily with a meal. 01/10/17  Yes Bensimhon, Shaune Pascal, MD  levocetirizine (XYZAL) 5 MG tablet Take 5 mg by mouth every evening.   Yes [provider]  losartan (COZAAR) 100 MG tablet Take 1 tablet (100 mg total) by mouth daily. 04/09/17 04/09/18 Yes Eileen Stanford, PA-C  sitaGLIPtin (JANUVIA) 100 MG tablet Take 100 mg by mouth daily.   Yes [provider]  spironolactone (ALDACTONE) 25 MG tablet Take 1 tablet (25 mg total) by mouth daily. 01/10/17  Yes Bensimhon, Shaune Pascal, MD    Family History Family History  Problem Relation Age of Onset  . Diabetes Mother   . Migraines Mother   . Hypertension Father   . Diabetes Maternal Grandmother   . Diabetes Paternal Grandmother     Social History Social History  Substance Use Topics  . Smoking status: Never Smoker  . Smokeless tobacco: Never Used  . Alcohol use 0.0 oz/week     Comment: occ     Allergies   Septra ds [sulfamethoxazole-trimethoprim]; Bidil [isosorb dinitrate-hydralazine]; Lactose intolerance (gi); Lobster [shellfish allergy]; Latex; and Tape   Review of Systems Review of Systems  Constitutional: Positive for activity change.  Respiratory: Positive for shortness of breath. Negative for chest tightness.   Cardiovascular: Negative for leg swelling.     Physical Exam Updated Vital Signs BP 130/77   Pulse 80   Temp 98.8 F (37.1 C) (Oral)   Resp (!) 21   Ht 5\' 2"  (1.575 m)   Wt 87.1 kg (192 lb)   SpO2 98%   BMI 35.12 kg/m   Physical Exam  Constitutional: She is oriented to person, place, and time. She appears well-developed.  HENT:  Head: Normocephalic and atraumatic.  Eyes: EOM are normal.  Neck: Normal range of motion. Neck supple.  Cardiovascular: Normal rate.   Pulmonary/Chest: Effort normal.  Abdominal: Bowel sounds are normal.  Musculoskeletal: She exhibits no edema.  Neurological: She is alert and oriented to person, place, and time.  Skin: Skin is  warm and dry.  Nursing note and vitals reviewed.    ED Treatments / Results  Labs (all labs ordered are listed, but only abnormal results are displayed) Labs Reviewed  BASIC METABOLIC PANEL - Abnormal; Notable for the following:       Result Value   Glucose, Bld 323 (*)    All other components within normal limits  CBC - Abnormal; Notable for the following:    WBC 13.8 (*)    All other components within normal limits  I-STAT BETA HCG BLOOD, ED ([facility], [facility], [facility] ONLY)    EKG  EKG Interpretation None       Radiology No results found.  Procedures Procedures (including critical care time)  Medications Ordered in ED  Medications - No data to display   Initial Impression / Assessment and Plan / ED Course  I have reviewed the triage vital signs and the nursing notes.  Pertinent labs & imaging results that were available during my care of the patient were reviewed by me and considered in my medical decision making (see chart for details).     Pt comes in with cc of elevated BP. She is stable here, and the BP are 130-140s SBP. Pt is on Losartan. I indicated to our Cardiologist on call, that it would be helpful to gentry remind the administrative staff about the patients frustration. Cardiologist asked me to reassure patient, and inform her that entresto authorization can take longer than usual, which I did.   Final Clinical Impressions(s) / ED Diagnoses   Final diagnoses:  Chronic systolic congestive heart failure [facility])  Essential hypertension    New Prescriptions Discharge Medication List as of 04/11/2017 10:03 PM       Varney Biles, MD 04/12/17 1249

## 2017-04-12 NOTE — Telephone Encounter (Signed)
Entresto Prior Authorization approved by [facility] Medicaid effective dates 04/11/17-04/06/18  Carlean Jews, Pharm.D. PGY1 Pharmacy Resident 6/14/201812:51 PM Pager [phone]

## 2017-04-12 NOTE — Telephone Encounter (Signed)
Returned pt call to schedule appt

## 2017-04-27 ENCOUNTER — Other Ambulatory Visit (HOSPITAL_COMMUNITY): Payer: Self-pay | Admitting: Internal Medicine

## 2017-05-01 ENCOUNTER — Ambulatory Visit (HOSPITAL_COMMUNITY)
Admission: RE | Admit: 2017-05-01 | Discharge: 2017-05-01 | Disposition: A | Discharge status: Home / Self Care | Payer: Medicaid Other | Source: Ambulatory Visit | Attending: Internal Medicine | Admitting: Internal Medicine

## 2017-05-01 VITALS — BP 122/96 | HR 84 | Wt 192.6 lb

## 2017-05-01 DIAGNOSIS — E119 Type 2 diabetes mellitus without complications: Secondary | ICD-10-CM | POA: Diagnosis not present

## 2017-05-01 DIAGNOSIS — G43909 Migraine, unspecified, not intractable, without status migrainosus: Secondary | ICD-10-CM | POA: Insufficient documentation

## 2017-05-01 DIAGNOSIS — R5383 Other fatigue: Secondary | ICD-10-CM | POA: Diagnosis not present

## 2017-05-01 DIAGNOSIS — I5022 Chronic systolic (congestive) heart failure: Secondary | ICD-10-CM

## 2017-05-01 DIAGNOSIS — I110 Hypertensive heart disease with heart failure: Secondary | ICD-10-CM | POA: Insufficient documentation

## 2017-05-01 DIAGNOSIS — Z79899 Other long term (current) drug therapy: Secondary | ICD-10-CM | POA: Diagnosis not present

## 2017-05-01 DIAGNOSIS — I10 Essential (primary) hypertension: Secondary | ICD-10-CM

## 2017-05-01 DIAGNOSIS — F419 Anxiety disorder, unspecified: Secondary | ICD-10-CM | POA: Insufficient documentation

## 2017-05-01 DIAGNOSIS — Z794 Long term (current) use of insulin: Secondary | ICD-10-CM

## 2017-05-01 MED ORDER — AMLODIPINE BESYLATE 2.5 MG PO TABS: 2.5000 mg | ORAL_TABLET | Freq: Every day | ORAL | 3 refills | Status: DC

## 2017-05-01 NOTE — Patient Instructions (Signed)
START Amlodipine 2.5 mg(1 Tablet) Once Daily at bedtime  Echocardiogram has been ordered for you, we will schedule this for you at checkout.  Follow up for Blood Pressure check in 2 weeks.  Follow up in 4 months

## 2017-05-01 NOTE — Progress Notes (Signed)
Advanced Heart Failure Medication Review by a Pharmacist  Does the patient  feel that his/her medications are working for him/her?  yes  Has the patient been experiencing any side effects to the medications prescribed?  no  Does the patient measure his/her own blood pressure or blood glucose at home?  yes   Does the patient have any problems obtaining medications due to transportation or finances?   no  Understanding of regimen: good Understanding of indications: good Potential of compliance: good Patient understands to avoid NSAIDs. Patient understands to avoid decongestants.  Issues to address at subsequent visits: None   Pharmacist comments: Ms. Chim is a pleasant 31 yo F presenting without a medication list but with good recall of her regimen. She reports good compliance with her regimen and has not required any furosemide in the past few weeks. She did not have any other medication-related questions or concerns for me at this time.   Ruta Hinds. Velva Harman, PharmD, BCPS, CPP Clinical Pharmacist Pager: [phone] Phone: [phone] 05/01/2017 9:22 AM      Time with patient: 10 minutes Preparation and documentation time: 2 minutes Total time: 12 minutes

## 2017-05-01 NOTE — Progress Notes (Signed)
ADVANCED HF CLINIC NOTE  Patient ID: Sara [name], female   DOB: [birth year], 31 y.o.   MRN: [id number] PCP: Primary Cardiologist: Dr Haroldine Laws  HPI: Sara [name] is a 31 year old with history of DM, HTN, and 2 pregnancies complicated by preeclampsia.    Admitted  March 2017 with increased dsypnea and PND.  Prior  to admit she had a virus. CXR was concerning for CAP. Completed antibiotic course. ECHO performed and showed reduced EF -->35%. Diuresed with IV lasix. Instructed to stop breast feeding with addition of HF meds and to prevent pregnancy. Started on entresto, carvedilol and lasix. Discharge weight was 175 pounds.   Today she returns for HF follow up. Overall feeling ok. Denies SOB/PND/Orthopnea. Weight at home trending up but says she has been eating fast food.No fever or chills.   Taking all medications. She has norplant. Says blood glucose is out of control but she has follow up with Endocrinologist.   cMRI 10/17 EF 37% RV normal ECHO 04/2016 ~35-40%  ECHO 12/2015 ~30-35%.    CPX 7/17  FVC 2.68 (86%)    FEV1 2.43 (91%)       BP rest: 118/64 BP peak: 156/84 Peak VO2: 17.9 (63.9% predicted peak VO2) - corrects to 23.4 fo IBW VE/VCO2 slope: 31.2 OUES: 1.63 Peak RER: 1.17 Ventilatory Threshold: 13.9 (49.6% predicted or measured peak VO2) VE/MVV: 55.9% O2pulse: 10  (83% predicted O2pulse)  Labs 5/4 2017 K 4.3 Creatinine 0.71 BNP 54 Labs 01/27/2016: BNP 274   SH: Lives with her boyfriend and 2 children.   ROS: All systems negative except as listed in HPI, PMH and Problem List.  SH:  Social History   Social History  . Marital status: Divorced    Spouse name: N/A  . Number of children: 1  . Years of education: 28   Occupational History  . Not on file.   Social History Main Topics  . Smoking status: Never Smoker  . Smokeless tobacco: Never Used  . Alcohol use 0.0 oz/week     Comment: occ  . Drug use: No  . Sexual activity: Yes   Other Topics  Concern  . Not on file   Social History Narrative   Lives with boyfriend and child   Is pregnant (20weeks) with second child   1st born is a boy and her second is a boy   Drinks one cup of coffee a day    FH:  Family History  Problem Relation Age of Onset  . Diabetes Mother   . Migraines Mother   . Hypertension Father   . Diabetes Maternal Grandmother   . Diabetes Paternal Grandmother     Past Medical History:  Diagnosis Date  . Anxiety    diagnosed in 2007  . CHF (congestive heart failure) ([place])   . Diabetes mellitus    type 2 - uncontrolled  . Hypertension    benign essential  . Infected pilonidal cyst   . Migraine   . Non-reassuring fetal heart rate or rhythm affecting mother     Current Outpatient Prescriptions  Medication Sig Dispense Refill  . acetaminophen (TYLENOL) 500 MG tablet Take 500-1,000 mg by mouth every 6 (six) hours as needed (for pain or toothaches).    . butalbital-acetaminophen-caffeine (FIORICET, ESGIC) 50-325-40 MG tablet Take 1 tablet by mouth every 4 (four) hours as needed for headache. 14 tablet 0  . carvedilol (COREG) 6.25 MG tablet TAKE 1 TABLET BY MOUTH TWICE DAILY WITH A MEAL. 60 tablet  3  . ENTRESTO 97-103 MG TAKE 1 TABLET BY MOUTH TWICE DAILY 180 tablet 3  . etonogestrel (NEXPLANON) 68 MG IMPL implant 1 each by Subdermal route once.    . insulin aspart (NOVOLOG) 100 UNIT/ML injection Inject 22 Units into the skin every evening. With the largest meal of the day    . insulin glargine (LANTUS) 100 UNIT/ML injection Inject 44 Units into the skin at bedtime.     . Insulin Pen Needle (1ST CHOICE PEN NEEDLES) 31G X 6 MM MISC Use with insulin pen 100 each 0  . ivabradine (CORLANOR) 7.5 MG TABS tablet Take 1 tablet (7.5 mg total) by mouth 2 (two) times daily with a meal. 60 tablet 3  . levocetirizine (XYZAL) 5 MG tablet Take 5 mg by mouth every evening.    . sitaGLIPtin (JANUVIA) 100 MG tablet Take 100 mg by mouth daily.    Marland Kitchen spironolactone  (ALDACTONE) 25 MG tablet Take 1 tablet (25 mg total) by mouth daily. 30 tablet 3  . furosemide (LASIX) 20 MG tablet Take 1 tablet (20 mg total) by mouth as needed (for weight gain). (Patient not taking: Reported on 05/01/2017) 30 tablet 3   No current facility-administered medications for this encounter.     Vitals:   05/01/17 0914  BP: (!) 122/96  Pulse: 84  SpO2: 100%  Weight: 192 lb 9.6 oz (87.4 kg)   Filed Weights   05/01/17 0914  Weight: 192 lb 9.6 oz (87.4 kg)    PHYSICAL EXAM: General:  Well appearing. No resp difficulty HEENT: normal Neck: supple. JVP flat.. Carotids 2+ bilat; no bruits. No lymphadenopathy or thryomegaly appreciated. Cor: PMI nondisplaced. Regular rate & rhythm. No rubs, gallops or murmurs. Lungs: clear Abdomen: soft, nontender, nondistended. No hepatosplenomegaly. No bruits or masses. Good bowel sounds. Extremities: no cyanosis, clubbing, rash, edema Neuro: alert & orientedx3, cranial nerves grossly intact. moves all 4 extremities w/o difficulty. Affect pleasant    ASSESSMENT & PLAN: 1. Chronic Systolic CHF- 6/37/85YIFO EF 35%. - ? Viral Cardiomyopathy versus HTN. Also pre-eclampsia with both pregnancies.  ECHO (7/17) EF ->  35%. cMRI 10/17 EF 37% NYHA II. Volume status stable.  Continue current HF meds. Continue entresto, spiro, and carvedilol.  Continue corlanor 7.5 mg twice a day.  - She is intolerant of carvedilol more than 6.25 bid due to fatigue - Intolerant Bidil due to headache. Failed on 2 separate occasions.   Repeat ECHO  2 HTN  -  Better but still elevated. Add 2.5 mg amlodipine daily. Follow up in 2 weeks for BP check.  3 DM-  Poorly controlled. She has follow up with endocrinology next month.  4  OB-  Norplant in place.  She understands she needs to avoid pregnancy.  5. Fatigue- no sleep apnea noted on sleep study.    2 weeks for BP check. BMET today.  Follow up in 4 months.     Amy Clegg,9:29 AM

## 2017-05-07 IMAGING — CT CT ANGIO CHEST
1 of 8 series · 17 of 36 positions shown · IV contrast (Iohexol (Omnipaque 350))
Comparison: Chest radiograph November 06, 2006

CLINICAL DATA: Shortness of breath and chest pain for 1 day

EXAM:
CT ANGIOGRAPHY CHEST WITH CONTRAST
TECHNIQUE: Multidetector CT imaging of the chest was performed using the
standard protocol during bolus administration of intravenous
contrast. Multiplanar CT image reconstructions and MIPs were
obtained to evaluate the vascular anatomy.
CONTRAST:  75 mL Isovue 370 nonionic

[Series 406: thins pacs · axial · 0.79mm/px · z∈[-230,+0]mm · 17 of 260 slices shown]
[im 15/260  lung]
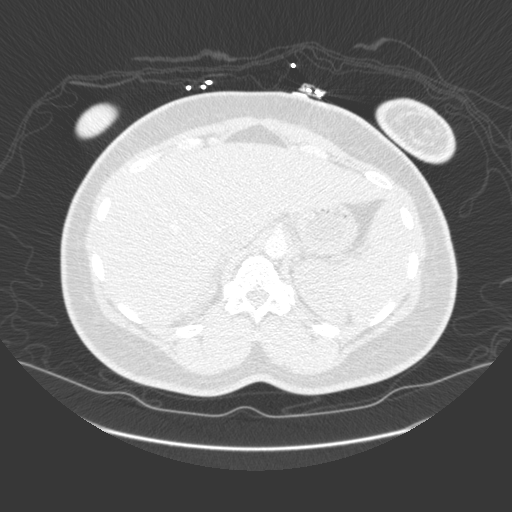
[im 29/260  mediastinal]
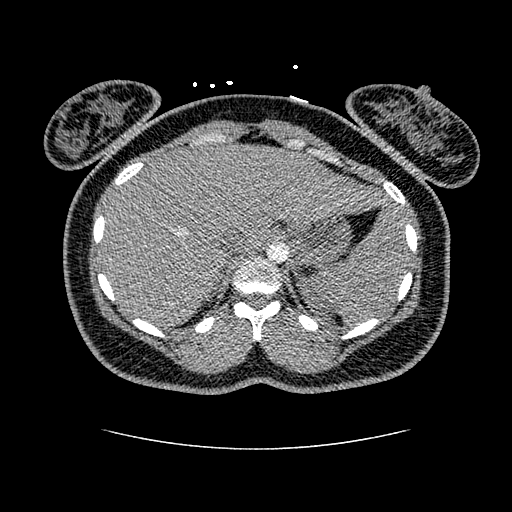
[im 44/260  lung]
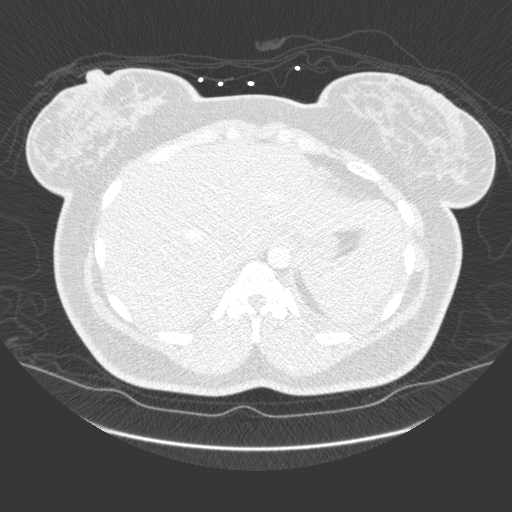
[im 58/260  mediastinal]
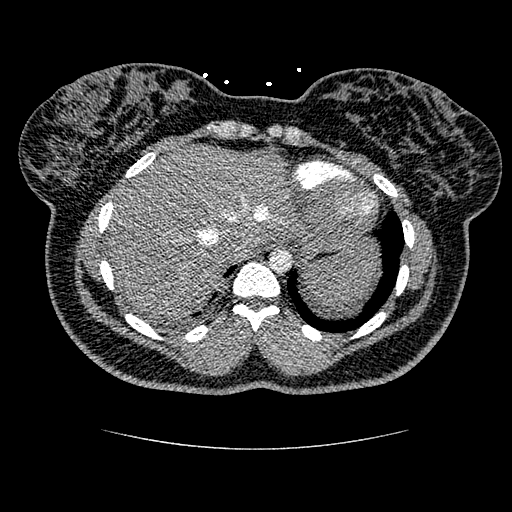
[im 72/260  lung]
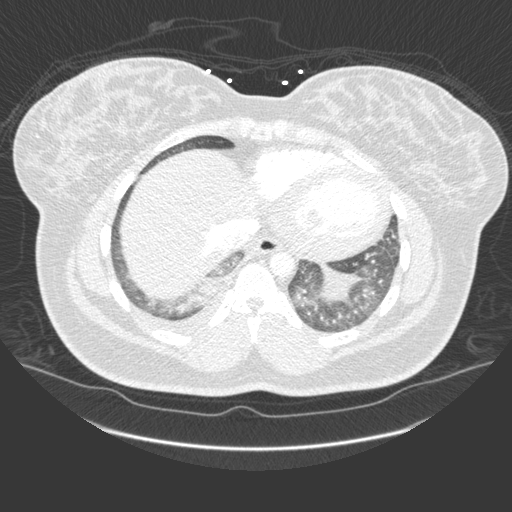
[im 87/260  mediastinal]
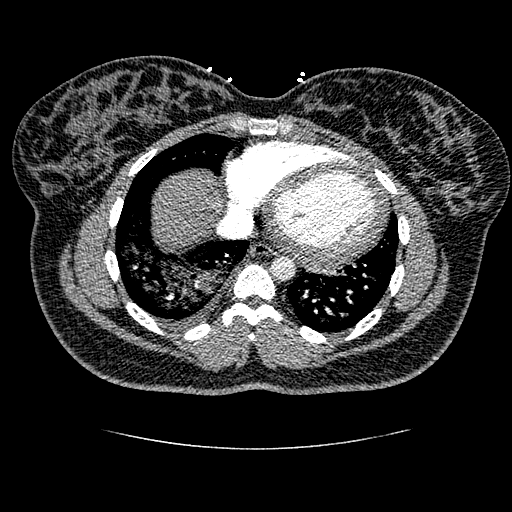
[im 101/260  lung]
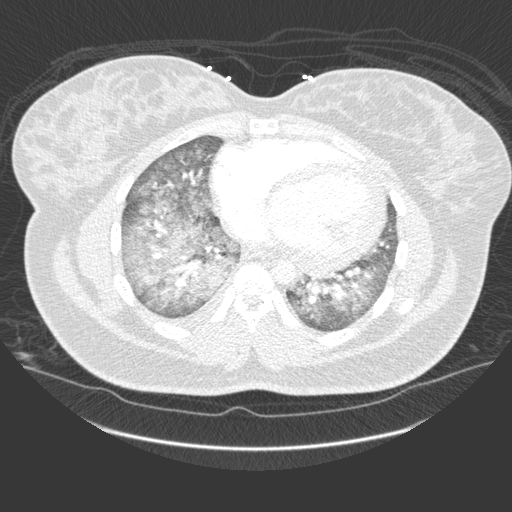
[im 116/260  mediastinal]
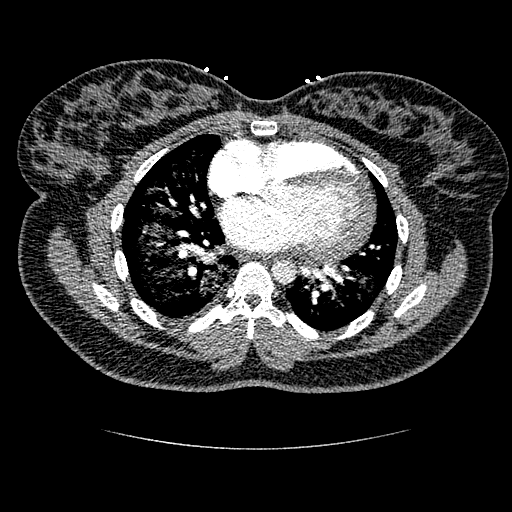
[im 130/260  lung]
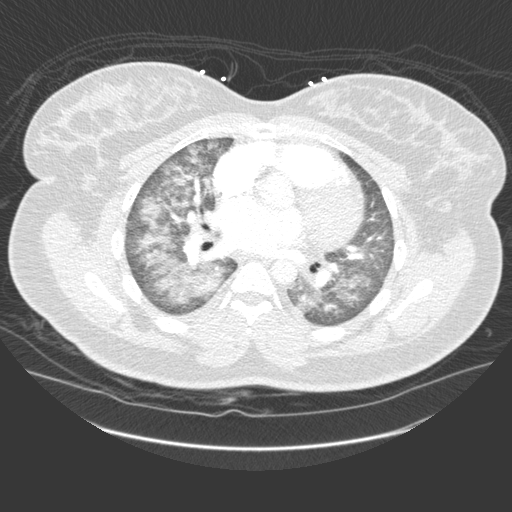
[im 144/260  mediastinal]
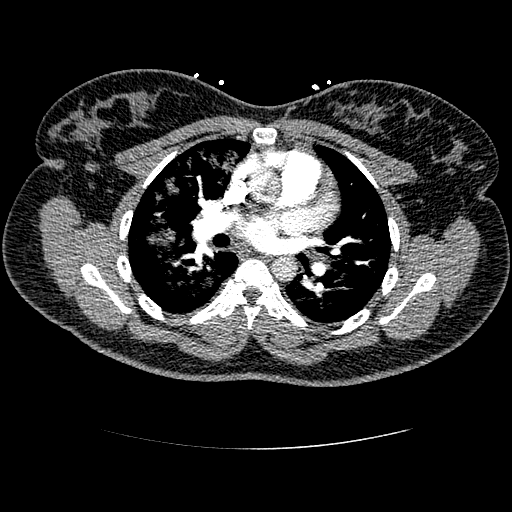
[im 159/260  lung]
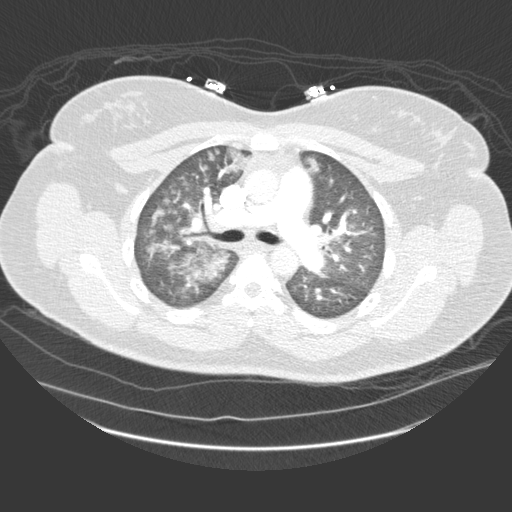
[im 173/260  mediastinal]
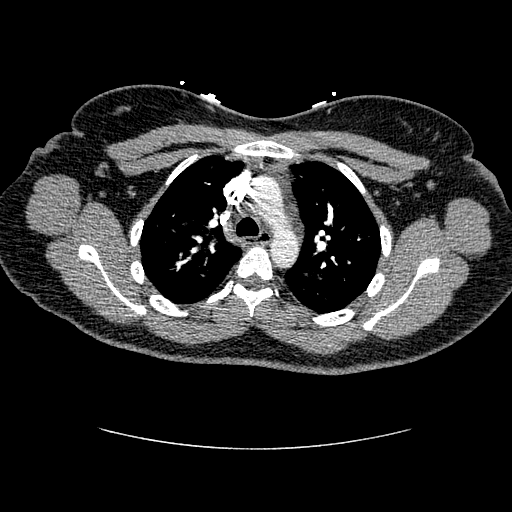
[im 188/260  lung]
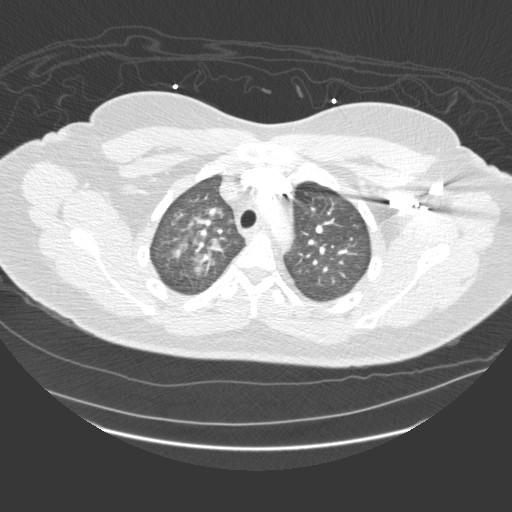
[im 202/260  mediastinal]
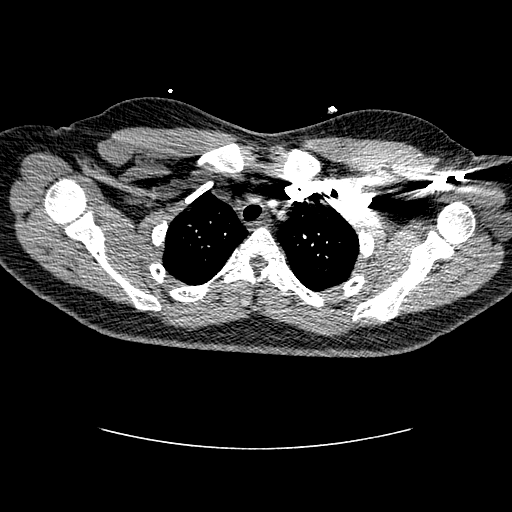
[im 216/260  lung]
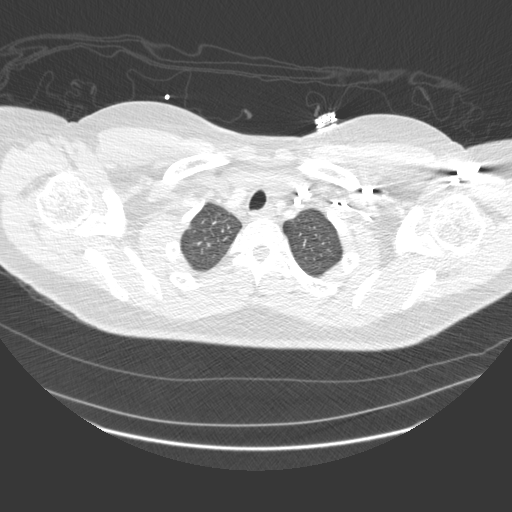
[im 231/260  mediastinal]
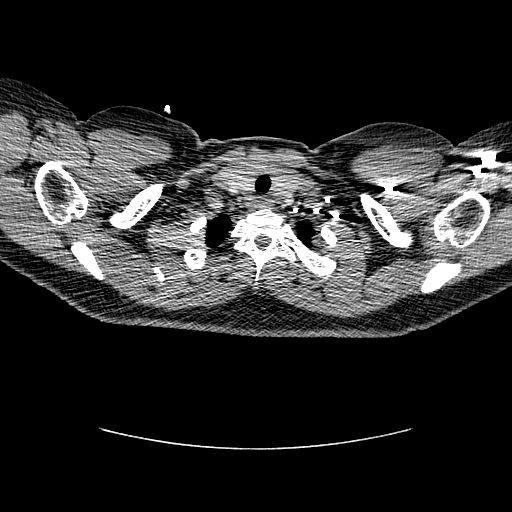
[im 245/260  lung]
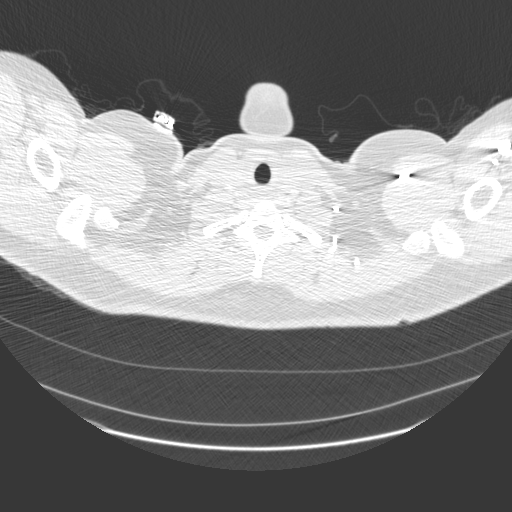

[17 of 36 positions shown; findings below may reference images not displayed]

FINDINGS: Mediastinum/Lymph Nodes: There is no demonstrable pulmonary embolus.
There is no thoracic aortic aneurysm or dissection. The visualized
great vessels appear normal. The heart is mildly enlarged. There is
no appreciable pericardial effusion.

No thyroid lesions are evident. There is no appreciable thoracic
adenopathy.

Lungs/Pleura: There is diffuse airspace consolidation throughout the
lungs bilaterally, somewhat more severe on the right than left.
There is relative peripheral sparing bilaterally. Airspace opacity
is seen throughout most lobes in segments to varying degrees with
the least amount of airspace opacity in the left apex region. There
are no appreciable pleural effusions.

Upper abdomen: There is reflux of contrast into the inferior vena
cava and hepatic veins. Visualized upper abdominal structures are
otherwise normal.

Musculoskeletal:  There are no blastic or lytic bone lesions.

Review of the MIP images confirms the above findings.
IMPRESSION: No demonstrable pulmonary embolus.

Widespread alveolar opacity bilaterally, somewhat more severe on the
right than on the left. Relative peripheral sparing is noted
bilaterally. The appearance is consistent with widespread alveolar
edema, either due to congestive heart failure or noncardiogenic
etiology. Given pregnancy and history of hypertension, preeclampsia
as a cause of widespread edema must be of concern. Differential
considerations must include widespread infectious pneumonia, diffuse
aspiration, or pulmonary hemorrhage.

Reflux of contrast into the inferior vena cava and hepatic veins may
be indicative of a degree of increased right heart pressure.

No adenopathy evident.

## 2017-05-15 ENCOUNTER — Encounter (HOSPITAL_COMMUNITY): Payer: Self-pay

## 2017-05-15 ENCOUNTER — Ambulatory Visit (HOSPITAL_COMMUNITY)
Admission: RE | Admit: 2017-05-15 | Discharge: 2017-05-15 | Disposition: A | Discharge status: Home / Self Care | Payer: Medicaid Other | Source: Ambulatory Visit | Attending: Cardiology | Admitting: Cardiology

## 2017-05-15 VITALS — BP 130/84 | HR 65 | Wt 190.2 lb

## 2017-05-15 DIAGNOSIS — L298 Other pruritus: Secondary | ICD-10-CM | POA: Insufficient documentation

## 2017-05-15 DIAGNOSIS — I5022 Chronic systolic (congestive) heart failure: Secondary | ICD-10-CM

## 2017-05-15 NOTE — Progress Notes (Signed)
Patient c/o generalized itching since starting norvasc 2 weeks ago. Advised per Amy Clegg NP-C and Ileene Patrick clinical CHF pharmD that this may be a side effect of the medication and that she can see if side effect wears off or she may choose to stop medication. Patient states she will give medication another week or so to see if side effect subsides. Advised to stop immediately and call clinic if she develop hives, rash, or mouth itching/swelling. Aware and agreeable.  Renee Pain, RN

## 2017-05-20 ENCOUNTER — Other Ambulatory Visit (HOSPITAL_COMMUNITY): Payer: Self-pay | Admitting: Internal Medicine

## 2017-05-20 DIAGNOSIS — I5022 Chronic systolic (congestive) heart failure: Secondary | ICD-10-CM

## 2017-05-21 ENCOUNTER — Ambulatory Visit (HOSPITAL_COMMUNITY)
Admission: RE | Admit: 2017-05-21 | Discharge: 2017-05-21 | Disposition: A | Discharge status: Home / Self Care | Payer: Medicaid Other | Source: Ambulatory Visit | Attending: Adult Health | Admitting: Adult Health

## 2017-05-21 ENCOUNTER — Other Ambulatory Visit (HOSPITAL_COMMUNITY): Payer: Self-pay | Admitting: *Deleted

## 2017-05-21 DIAGNOSIS — I5022 Chronic systolic (congestive) heart failure: Secondary | ICD-10-CM | POA: Diagnosis not present

## 2017-05-21 DIAGNOSIS — I110 Hypertensive heart disease with heart failure: Secondary | ICD-10-CM | POA: Diagnosis not present

## 2017-05-21 DIAGNOSIS — E119 Type 2 diabetes mellitus without complications: Secondary | ICD-10-CM | POA: Diagnosis not present

## 2017-05-21 MED ORDER — IVABRADINE HCL 7.5 MG PO TABS: 7.5000 mg | ORAL_TABLET | Freq: Two times a day (BID) | ORAL | 3 refills | Status: DC

## 2017-05-21 NOTE — Progress Notes (Signed)
  Echocardiogram 2D Echocardiogram has been performed.  Donata Clay 05/21/2017, 4:09 PM

## 2017-05-25 ENCOUNTER — Telehealth (HOSPITAL_COMMUNITY): Payer: Self-pay | Admitting: Cardiology

## 2017-05-25 NOTE — Telephone Encounter (Signed)
PATIENT CALLED TO REQUEST OF ECHO PATIENT WAS TOLD SHE WOULD HAVE RESULTS REPORTED TO HER THE VERY NEXT DAY  ADVISED ALTHOUGH RESULTS ARE AVAILABLE THIS TIME, RESULTS HAVE NOT BEEN SIGNED BY CHF PROVIDER, ORDERING PROVIDER IS ON VACATION AND WILL REVIEW ONCE SHE RETURNS. WILL PLACE REQUEST TO HAVE SOMEONE RETURN CALL.   PLEASE CALL PATIENT WITH RESULTS ONCE REVIEWED

## 2017-05-31 NOTE — Telephone Encounter (Signed)
Patient called back and asked to speak to [facility] again regarding her message. Will send to Eddie Candle to call patient back.

## 2017-05-31 NOTE — Telephone Encounter (Signed)
Left detailed VM of results

## 2017-06-06 ENCOUNTER — Other Ambulatory Visit: Payer: Self-pay | Admitting: Internal Medicine

## 2017-06-06 DIAGNOSIS — R946 Abnormal results of thyroid function studies: Secondary | ICD-10-CM

## 2017-06-07 NOTE — Progress Notes (Signed)
REVIEWING CHART NOTED PT LAST ECHO 05-21-2017 ,  EF 30-35%.  PER ANESTHESIA GUIDELINES THIS IS BELOW 40% AND WILL NEED TO BEEN DONE AT MAIN OR.  CALLED AND LM FOR STACIE, OR SCHEDULER, VIA PHONE ABOUT IS NOT CANDIDATE FOR [facility].

## 2017-06-08 NOTE — Telephone Encounter (Signed)
Discussed results with patient.  No further questions/concerns.  Renee Pain, RN

## 2017-06-12 ENCOUNTER — Telehealth (HOSPITAL_COMMUNITY): Payer: Self-pay | Admitting: *Deleted

## 2017-06-12 ENCOUNTER — Other Ambulatory Visit: Payer: Self-pay | Admitting: Nurse Practitioner

## 2017-06-12 ENCOUNTER — Telehealth: Payer: Self-pay | Admitting: Physician Assistant

## 2017-06-12 ENCOUNTER — Ambulatory Visit
Admission: RE | Admit: 2017-06-12 | Discharge: 2017-06-12 | Disposition: A | Discharge status: Home / Self Care | Payer: Medicaid Other | Source: Ambulatory Visit | Attending: Internal Medicine | Admitting: Internal Medicine

## 2017-06-12 DIAGNOSIS — R946 Abnormal results of thyroid function studies: Secondary | ICD-10-CM

## 2017-06-12 MED ORDER — FUROSEMIDE 20 MG PO TABS: 20.0000 mg | ORAL_TABLET | ORAL | 3 refills | Status: AC | PRN

## 2017-06-12 NOTE — Telephone Encounter (Signed)
   Pt called to report that she took lasix earlier b/c wt is up 5 lbs over the past few days and she feels mildly dyspneic.  She also has a cold with significant nasal congestion.  She just noted that her lasix expired within the past few days and is wondering if she needs to take another dose.  I advised that she can wait to assess response tonight and I have sent in a new Rx for lasix 20 mg daily prn weight gain, #30, 3 refills to her pharmacy.  If no response from current dose of lasix, she may take another.  I also rec that she purchase an OTC nasal saline spray to help with congestion.  Caller verbalized understanding and was grateful for the call back.  Murray Hodgkins, NP 06/12/2017, 6:09 PM

## 2017-06-12 NOTE — Telephone Encounter (Signed)
Patient called complaining of shortness of breath but thinks she may have a head cold.  She does feel like she has gained some weight but has not been checking daily weights.  She has a prescription to take 20 mg of lasix as needed for weight gain.  I advised her to try and take one of those and see if she feels difference in her breathing. Patient understands and no further questions.

## 2017-06-25 NOTE — Patient Instructions (Addendum)
Your procedure is scheduled on:  Tuesday, Sept. 4, 2018  Enter through the [employer] [facility] at:  7:30 AM  Pick up the phone at the desk and dial [phone].  Call this number if you have problems the morning of surgery: [phone].  Remember: Do NOT eat food or drink after:  Midnight Monday  Take these medicines the morning of surgery with a SIP OF WATER:  Carvedilol, Entresto, Corlanor, Xanax if needed  Take 1/2 (half) bedtime insulin dose the night before surgery  Stop ALL herbal medications at this time  Do NOT smoke the day of surgery.  Do NOT wear jewelry (body piercing), metal hair clips/bobby pins, make-up, artifical eyelashes or nail polish. Do NOT wear lotions, powders, or perfumes.  You may wear deodorant. Do NOT shave for 48 hours prior to surgery. Do NOT bring valuables to the hospital. Contacts, dentures, or bridgework may not be worn into surgery.  Have a responsible adult drive you home and stay with you for 24 hours after your procedure  Bring a copy of your healthcare power of attorney and living will documents.

## 2017-06-26 ENCOUNTER — Telehealth (HOSPITAL_COMMUNITY): Payer: Self-pay | Admitting: *Deleted

## 2017-06-26 ENCOUNTER — Encounter (HOSPITAL_COMMUNITY): Payer: Self-pay

## 2017-06-26 ENCOUNTER — Encounter (HOSPITAL_COMMUNITY)
Admission: RE | Admit: 2017-06-26 | Discharge: 2017-06-26 | Disposition: A | Discharge status: Home / Self Care | Payer: Medicaid Other | Source: Ambulatory Visit | Attending: Obstetrics & Gynecology | Admitting: Obstetrics & Gynecology

## 2017-06-26 DIAGNOSIS — Z01812 Encounter for preprocedural laboratory examination: Secondary | ICD-10-CM | POA: Diagnosis not present

## 2017-06-26 HISTORY — DX: Nontoxic goiter, unspecified: E04.9

## 2017-06-26 HISTORY — DX: Pneumonia, unspecified organism: J18.9

## 2017-06-26 HISTORY — DX: Anemia, unspecified: D64.9

## 2017-06-26 LAB — BASIC METABOLIC PANEL
Anion gap: 8 (ref 5–15)
BUN: 10 mg/dL (ref 6–20)
CHLORIDE: 104 mmol/L (ref 101–111)
CO2: 27 mmol/L (ref 22–32)
CREATININE: 0.76 mg/dL (ref 0.44–1.00)
Calcium: 9.7 mg/dL (ref 8.9–10.3)
GFR calc Af Amer: >60 mL/min (ref >60)
GFR calc non Af Amer: >60 mL/min (ref >60)
Glucose, Bld: 234 mg/dL — ABNORMAL HIGH (ref 65–99)
Potassium: 4.6 mmol/L (ref 3.5–5.1)
SODIUM: 139 mmol/L (ref 135–145)

## 2017-06-26 LAB — CBC
HEMATOCRIT: 39.1 % (ref 36.0–46.0)
HEMOGLOBIN: 12.4 g/dL (ref 12.0–15.0)
MCH: 26.7 pg (ref 26.0–34.0)
MCHC: 31.7 g/dL (ref 30.0–36.0)
MCV: 84.3 fL (ref 78.0–100.0)
Platelets: 342 10*3/uL (ref 150–400)
RBC: 4.64 MIL/uL (ref 3.87–5.11)
RDW: 13.6 % (ref 11.5–15.5)
WBC: 13.4 10*3/uL — ABNORMAL HIGH (ref 4.0–10.5)

## 2017-06-26 NOTE — Telephone Encounter (Signed)
Received surgical clearance for patient to have lap bilateral tubal ligation on 07/03/2017. Patient cleared from a cardiac standpoint per Dr. Haroldine Laws.  Clearance faxed today to [phone].

## 2017-06-26 NOTE — Pre-Procedure Instructions (Signed)
Dr. Lissa Hoard aware that patient has elevated glucose.

## 2017-07-02 NOTE — H&P (Signed)
Sara [name] is an 31 y.o. female. She is admitted for laparoscopic tubal cautery for sterilization.    Pertinent Gynecological History: Menses: flow is moderate Last pap: normal Date: 03/28/2017 OB History: G3, P0212   Menstrual History: No LMP recorded. Patient has had an implant.    Past Medical History:  Diagnosis Date  . Anemia   . Anxiety    diagnosed in 2007  . CHF (congestive heart failure) ([place])   . Diabetes mellitus    type 2 - uncontrolled  . Enlarged thyroid   . Hypertension    benign essential  . Infected pilonidal cyst   . Migraine   . Non-reassuring fetal heart rate or rhythm affecting mother   . Pneumonia    history of     Past Surgical History:  Procedure Laterality Date  . NO PAST SURGERIES      Family History  Problem Relation Age of Onset  . Diabetes Mother   . Migraines Mother   . Hypertension Father   . Diabetes Maternal Grandmother   . Diabetes Paternal Grandmother     Social History:  reports that she has never smoked. She has never used smokeless tobacco. She reports that she drinks alcohol. She reports that she does not use drugs.  Allergies:  Allergies  Allergen Reactions  . Septra Ds [Sulfamethoxazole-Trimethoprim] Hives and Itching  . Bidil [Isosorb Dinitrate-Hydralazine] Other (See Comments)    Migraines   . Lactose Intolerance (Gi) Diarrhea  . Lobster [Shellfish Allergy] Itching    Throat itches, but no shortness of breath  . Pineapple Itching    Throat itches  . Latex Rash  . Tape Rash  . Terconazole Rash    No prescriptions prior to admission.    Review of Systems  All other systems reviewed and are negative.   currently breastfeeding. Physical Exam  Constitutional: She is oriented to person, place, and time. She appears well-developed and well-nourished.  HENT:  Head: Normocephalic.  Eyes: Pupils are equal, round, and reactive to light.  Neck: Normal range of motion.  Cardiovascular: Normal rate and  regular rhythm.   Respiratory: Effort normal and breath sounds normal.  GI: Soft.  Genitourinary: Vagina normal and uterus normal.  Neurological: She is alert and oriented to person, place, and time.  Skin: Skin is warm and dry.    No results found for this or any previous visit (from the past 24 hour(s)).  No results found.  Assessment/Plan: Patient states that she does not feel well with hormonal contraceptives.  She understands that tubal sterilization is permanent and difficult to reverse.  She understands a 11/998 failure rate.  She is aware of other non-hormonal reversible contraceptive options.  We will proceed with tubal sterilization.  Ethaniel Garfield D 07/02/2017, 10:41 AM

## 2017-07-03 ENCOUNTER — Ambulatory Visit (HOSPITAL_COMMUNITY): Payer: Medicaid Other | Admitting: Anesthesiology

## 2017-07-03 ENCOUNTER — Encounter (HOSPITAL_COMMUNITY): Payer: Self-pay

## 2017-07-03 ENCOUNTER — Ambulatory Visit (HOSPITAL_BASED_OUTPATIENT_CLINIC_OR_DEPARTMENT_OTHER)
Admission: RE | Admit: 2017-07-03 | Discharge: 2017-07-03 | Disposition: A | Discharge status: Home / Self Care | Payer: Medicaid Other | Source: Ambulatory Visit | Attending: Obstetrics & Gynecology | Admitting: Obstetrics & Gynecology

## 2017-07-03 ENCOUNTER — Encounter (HOSPITAL_COMMUNITY)
Admission: RE | Disposition: A | Discharge status: Home / Self Care | Payer: Self-pay | Source: Ambulatory Visit | Attending: Obstetrics & Gynecology

## 2017-07-03 DIAGNOSIS — E119 Type 2 diabetes mellitus without complications: Secondary | ICD-10-CM | POA: Diagnosis not present

## 2017-07-03 DIAGNOSIS — Z79899 Other long term (current) drug therapy: Secondary | ICD-10-CM | POA: Insufficient documentation

## 2017-07-03 DIAGNOSIS — I509 Heart failure, unspecified: Secondary | ICD-10-CM | POA: Diagnosis not present

## 2017-07-03 DIAGNOSIS — Z794 Long term (current) use of insulin: Secondary | ICD-10-CM | POA: Diagnosis not present

## 2017-07-03 DIAGNOSIS — Z302 Encounter for sterilization: Secondary | ICD-10-CM | POA: Insufficient documentation

## 2017-07-03 DIAGNOSIS — I110 Hypertensive heart disease with heart failure: Secondary | ICD-10-CM | POA: Insufficient documentation

## 2017-07-03 DIAGNOSIS — F419 Anxiety disorder, unspecified: Secondary | ICD-10-CM | POA: Insufficient documentation

## 2017-07-03 DIAGNOSIS — Z882 Allergy status to sulfonamides status: Secondary | ICD-10-CM | POA: Insufficient documentation

## 2017-07-03 HISTORY — PX: LAPAROSCOPIC TUBAL LIGATION: SHX1937

## 2017-07-03 LAB — GLUCOSE, CAPILLARY
GLUCOSE-CAPILLARY: 263 mg/dL — AB (ref 65–99)
GLUCOSE-CAPILLARY: 208 mg/dL — AB (ref 65–99)
Glucose-Capillary: 151 mg/dL — ABNORMAL HIGH (ref 65–99)
Glucose-Capillary: 183 mg/dL — ABNORMAL HIGH (ref 65–99)

## 2017-07-03 LAB — PREGNANCY, URINE: Preg Test, Ur: NEGATIVE

## 2017-07-03 SURGERY — LIGATION, FALLOPIAN TUBE, LAPAROSCOPIC
Anesthesia: General | Laterality: Bilateral

## 2017-07-03 MED ORDER — FENTANYL CITRATE (PF) 250 MCG/5ML IJ SOLN
INTRAMUSCULAR | Status: AC
  Filled 2017-07-03: qty 5

## 2017-07-03 MED ORDER — ALBUTEROL SULFATE HFA 108 (90 BASE) MCG/ACT IN AERS
INHALATION_SPRAY | RESPIRATORY_TRACT | Status: DC | PRN
  Administered 2017-07-03 (×2): 2 via RESPIRATORY_TRACT

## 2017-07-03 MED ORDER — MIDAZOLAM HCL 2 MG/2ML IJ SOLN
INTRAMUSCULAR | Status: AC
  Filled 2017-07-03: qty 2

## 2017-07-03 MED ORDER — ALBUTEROL SULFATE HFA 108 (90 BASE) MCG/ACT IN AERS
INHALATION_SPRAY | RESPIRATORY_TRACT | Status: AC
  Filled 2017-07-03: qty 6.7

## 2017-07-03 MED ORDER — MIDAZOLAM HCL 2 MG/2ML IJ SOLN
INTRAMUSCULAR | Status: DC | PRN
  Administered 2017-07-03: 2 mg via INTRAVENOUS

## 2017-07-03 MED ORDER — ONDANSETRON HCL 4 MG/2ML IJ SOLN
INTRAMUSCULAR | Status: AC
  Filled 2017-07-03: qty 2

## 2017-07-03 MED ORDER — INSULIN ASPART 100 UNIT/ML ~~LOC~~ SOLN
SUBCUTANEOUS | Status: AC
  Administered 2017-07-03: 10 [IU] via SUBCUTANEOUS
  Filled 2017-07-03: qty 1

## 2017-07-03 MED ORDER — KETAMINE HCL 10 MG/ML IJ SOLN
INTRAMUSCULAR | Status: DC | PRN
  Administered 2017-07-03: 50 mg via INTRAVENOUS

## 2017-07-03 MED ORDER — PROPOFOL 10 MG/ML IV BOLUS
INTRAVENOUS | Status: AC
  Filled 2017-07-03: qty 20

## 2017-07-03 MED ORDER — FENTANYL CITRATE (PF) 100 MCG/2ML IJ SOLN
INTRAMUSCULAR | Status: DC | PRN
  Administered 2017-07-03: 100 ug via INTRAVENOUS
  Administered 2017-07-03 (×3): 50 ug via INTRAVENOUS

## 2017-07-03 MED ORDER — KETAMINE HCL 10 MG/ML IJ SOLN
INTRAMUSCULAR | Status: AC
  Filled 2017-07-03: qty 1

## 2017-07-03 MED ORDER — HYDROMORPHONE HCL 1 MG/ML IJ SOLN
INTRAMUSCULAR | Status: AC
  Filled 2017-07-03: qty 0.5

## 2017-07-03 MED ORDER — INSULIN ASPART 100 UNIT/ML ~~LOC~~ SOLN
10.0000 [IU] | Freq: Once | SUBCUTANEOUS | Status: AC
  Administered 2017-07-03: 10 [IU] via SUBCUTANEOUS

## 2017-07-03 MED ORDER — ROCURONIUM BROMIDE 100 MG/10ML IV SOLN
INTRAVENOUS | Status: AC
  Filled 2017-07-03: qty 1

## 2017-07-03 MED ORDER — GLYCOPYRROLATE 0.2 MG/ML IJ SOLN
INTRAMUSCULAR | Status: DC | PRN
  Administered 2017-07-03: 0.1 mg via INTRAVENOUS

## 2017-07-03 MED ORDER — ROCURONIUM BROMIDE 100 MG/10ML IV SOLN
INTRAVENOUS | Status: DC | PRN
  Administered 2017-07-03: 40 mg via INTRAVENOUS

## 2017-07-03 MED ORDER — ONDANSETRON HCL 4 MG/2ML IJ SOLN
INTRAMUSCULAR | Status: DC | PRN
  Administered 2017-07-03: 4 mg via INTRAVENOUS

## 2017-07-03 MED ORDER — HYDROMORPHONE HCL 1 MG/ML IJ SOLN
0.2500 mg | INTRAMUSCULAR | Status: DC | PRN
  Administered 2017-07-03: 0.5 mg via INTRAVENOUS

## 2017-07-03 MED ORDER — SUCCINYLCHOLINE CHLORIDE 200 MG/10ML IV SOSY
PREFILLED_SYRINGE | INTRAVENOUS | Status: AC
  Filled 2017-07-03: qty 10

## 2017-07-03 MED ORDER — LACTATED RINGERS IV SOLN
INTRAVENOUS | Status: DC
  Administered 2017-07-03: 125 mL/h via INTRAVENOUS

## 2017-07-03 MED ORDER — SCOPOLAMINE 1 MG/3DAYS TD PT72
1.0000 | MEDICATED_PATCH | Freq: Once | TRANSDERMAL | Status: DC
  Administered 2017-07-03: 1.5 mg via TRANSDERMAL

## 2017-07-03 MED ORDER — DEXAMETHASONE SODIUM PHOSPHATE 4 MG/ML IJ SOLN
INTRAMUSCULAR | Status: DC | PRN
  Administered 2017-07-03: 4 mg via INTRAVENOUS

## 2017-07-03 MED ORDER — SUGAMMADEX SODIUM 500 MG/5ML IV SOLN
INTRAVENOUS | Status: DC | PRN
  Administered 2017-07-03: 500 mg via INTRAVENOUS

## 2017-07-03 MED ORDER — LIDOCAINE HCL (CARDIAC) 20 MG/ML IV SOLN
INTRAVENOUS | Status: AC
  Filled 2017-07-03: qty 5

## 2017-07-03 MED ORDER — LIDOCAINE HCL (CARDIAC) 20 MG/ML IV SOLN
INTRAVENOUS | Status: DC | PRN
  Administered 2017-07-03: 100 mg via INTRAVENOUS

## 2017-07-03 MED ORDER — PROPOFOL 10 MG/ML IV BOLUS
INTRAVENOUS | Status: DC | PRN
  Administered 2017-07-03: 80 mg via INTRAVENOUS
  Administered 2017-07-03: 30 mg via INTRAVENOUS

## 2017-07-03 MED ORDER — DEXAMETHASONE SODIUM PHOSPHATE 10 MG/ML IJ SOLN
INTRAMUSCULAR | Status: AC
  Filled 2017-07-03: qty 1

## 2017-07-03 MED ORDER — SCOPOLAMINE 1 MG/3DAYS TD PT72
MEDICATED_PATCH | TRANSDERMAL | Status: AC
  Administered 2017-07-03: 1.5 mg via TRANSDERMAL
  Filled 2017-07-03: qty 1

## 2017-07-03 MED ORDER — OXYCODONE-ACETAMINOPHEN 5-325 MG PO TABS: 2.0000 | ORAL_TABLET | ORAL | 0 refills | Status: DC | PRN

## 2017-07-03 SURGICAL SUPPLY — 28 items
ADH SKN CLS APL DERMABOND .7 (GAUZE/BANDAGES/DRESSINGS) ×1
CATH FOLEY LATEX FREE 14FR (CATHETERS) ×2
CATH FOLEY LF 14FR (CATHETERS) IMPLANT
CATH ROBINSON RED A/P 16FR (CATHETERS) ×1 IMPLANT
CHLORAPREP W/TINT 26ML (MISCELLANEOUS) ×1 IMPLANT
CLOTH BEACON ORANGE TIMEOUT ST (SAFETY) ×2 IMPLANT
DERMABOND ADVANCED (GAUZE/BANDAGES/DRESSINGS) ×1
DERMABOND ADVANCED .7 DNX12 (GAUZE/BANDAGES/DRESSINGS) ×1 IMPLANT
DRSG OPSITE POSTOP 3X4 (GAUZE/BANDAGES/DRESSINGS) IMPLANT
GLOVE BIOGEL PI IND STRL 7.0 (GLOVE) ×1 IMPLANT
GLOVE BIOGEL PI INDICATOR 7.0 (GLOVE) ×2
GLOVE ECLIPSE 6.0 STRL STRAW (GLOVE) ×1 IMPLANT
GLOVE ECLIPSE 6.5 STRL STRAW (GLOVE) ×1 IMPLANT
GLOVE NEODERM STER SZ 7 (GLOVE) ×1 IMPLANT
GLOVE SURG SS PI 6.5 STRL IVOR (GLOVE) ×2 IMPLANT
GOWN STRL REUS W/TWL LRG LVL3 (GOWN DISPOSABLE) ×4 IMPLANT
NEEDLE INSUFFLATION 120MM (ENDOMECHANICALS) ×1 IMPLANT
PACK LAPAROSCOPY BASIN (CUSTOM PROCEDURE TRAY) ×2 IMPLANT
PACK TRENDGUARD 450 HYBRID PRO (MISCELLANEOUS) IMPLANT
PACK TRENDGUARD 600 HYBRD PROC (MISCELLANEOUS) IMPLANT
PROTECTOR NERVE ULNAR (MISCELLANEOUS) ×3 IMPLANT
SLEEVE XCEL OPT CAN 5 100 (ENDOMECHANICALS) ×2 IMPLANT
SUT VICRYL 4-0 PS2 18IN ABS (SUTURE) IMPLANT
TOWEL OR 17X24 6PK STRL BLUE (TOWEL DISPOSABLE) ×4 IMPLANT
TRENDGUARD 450 HYBRID PRO PACK (MISCELLANEOUS) ×2
TRENDGUARD 600 HYBRID PROC PK (MISCELLANEOUS)
TROCAR OPTI TIP 5M 100M (ENDOMECHANICALS) ×2 IMPLANT
WARMER LAPAROSCOPE (MISCELLANEOUS) ×2 IMPLANT

## 2017-07-03 NOTE — Discharge Instructions (Signed)

## 2017-07-03 NOTE — Progress Notes (Signed)
I have interviewed and performed the pertinent exams on my patient to confirm that there have been no significant changes in her condition since the dictation of her history and physical exam.  

## 2017-07-03 NOTE — Anesthesia Postprocedure Evaluation (Signed)
Anesthesia Post Note  Patient: Sara [name]  Procedure(s) Performed: Procedure(s) (LRB): LAPAROSCOPIC BILATERAL TUBAL LIGATION (Bilateral)     Patient location during evaluation: PACU Anesthesia Type: General Level of consciousness: awake and alert and oriented Pain management: pain level controlled Vital Signs Assessment: post-procedure vital signs reviewed and stable Respiratory status: spontaneous breathing, nonlabored ventilation and respiratory function stable Cardiovascular status: blood pressure returned to baseline and stable Postop Assessment: no signs of nausea or vomiting Anesthetic complications: no    Last Vitals:  Vitals:   07/03/17 1211 07/03/17 1254  BP:    Pulse: 73 73  Resp: (!) 22 17  Temp:  36.7 C  SpO2: 96% 94%    Last Pain:  Vitals:   07/03/17 1254  TempSrc: Oral   Pain Goal: Patients Stated Pain Goal: 4 (07/03/17 0750)               Billee Balcerzak A.

## 2017-07-03 NOTE — Anesthesia Procedure Notes (Signed)
Procedure Name: Intubation Date/Time: 07/03/2017 9:46 AM Performed by: Flossie Dibble Pre-anesthesia Checklist: Patient identified, Patient being monitored, Timeout performed, Emergency Drugs available and Suction available Patient Re-evaluated:Patient Re-evaluated prior to induction Oxygen Delivery Method: Circle System Utilized and Circle system utilized Preoxygenation: Pre-oxygenation with 100% oxygen Induction Type: IV induction Ventilation: Mask ventilation without difficulty and Oral airway inserted - appropriate to patient size Laryngoscope Size: Mac and 3 Grade View: Grade I Tube type: Oral Tube size: 7.0 mm Number of attempts: 1 Airway Equipment and Method: stylet Placement Confirmation: ETT inserted through vocal cords under direct vision,  positive ETCO2 and breath sounds checked- equal and bilateral Secured at: 21 cm Tube secured with: Tape Dental Injury: Teeth and Oropharynx as per pre-operative assessment

## 2017-07-03 NOTE — Anesthesia Preprocedure Evaluation (Signed)
Anesthesia Evaluation  Patient identified by MRN, date of birth, ID band Patient awake    Reviewed: Allergy & Precautions, H&P , Patient's Chart, lab work & pertinent test results, reviewed documented beta blocker date and time   Airway Mallampati: II  TM Distance: >3 FB Neck ROM: full    Dental no notable dental hx.    Pulmonary    Pulmonary exam normal breath sounds clear to auscultation       Cardiovascular hypertension,  Rhythm:regular Rate:Normal     Neuro/Psych    GI/Hepatic   Endo/Other  diabetes  Renal/GU      Musculoskeletal   Abdominal   Peds  Hematology   Anesthesia Other Findings Diabetes mellitus  type 2 - uncontrolledHypertension benign essential     CHF      Systolic function was moderately to severely reduced. The   estimated ejection fraction was in the range of 30% to 35%.   Moderate diffuse hypokinesis with no identifiable regional   Variations Pneumonia   Reproductive/Obstetrics                             Anesthesia Physical Anesthesia Plan  ASA: III  Anesthesia Plan: General   Post-op Pain Management:    Induction: Intravenous  PONV Risk Score and Plan: 2 and Ondansetron, Dexamethasone, Midazolam and Scopolamine patch - Pre-op  Airway Management Planned: Oral ETT  Additional Equipment:   Intra-op Plan:   Post-operative Plan: Extubation in OR  Informed Consent: I have reviewed the patients History and Physical, chart, labs and discussed the procedure including the risks, benefits and alternatives for the proposed anesthesia with the patient or authorized representative who has indicated his/her understanding and acceptance.   Dental Advisory Given  Plan Discussed with: CRNA and Surgeon  Anesthesia Plan Comments: (  )        Anesthesia Quick Evaluation

## 2017-07-03 NOTE — Op Note (Signed)
Patient Name: NAEVIA UNTERREINER MRN: [id number]  Date of Surgery: 07/03/2017    PREOPERATIVE DIAGNOSIS: DESIRES STERILIZATION  POSTOPERATIVE DIAGNOSIS: DESIRES STERILIZATION   PROCEDURE: Laparoscopy bilateral tubal cuatery for sterilization  SURGEON: Janice Coffin. Deatra Ina M.D.  ASSISTANT:  ANESTHESIA: General endotracheal  ESTIMATED BLOOD LOSS: * No blood loss amount entered *  FINDINGS: Normal uterus, adnexa, and pelvis.  No adhesions or pathology identified.   COMPLICATIONS: None  INDICATIONS: Voluntary sterilization  PROCEDURE IN DETAIL:  The abdomen, vagina, and perineum were prepped and draped in sterile fashion.  The bladder was catheterized.  A Hulka tenaculum was placed into the endometrial canal and fixed to the anterior lip of the cervix.  The surgeon re- gowned and gloved.  An incision was made at the umbilicus and a Veress needle was inserted to create a pneumoperitoneum.  A 45mm laparoscope was place through a 74mm trochar at the umbilicus.  Under direct visualization a 77mm trochar was placed in the right lower quadrant.  The right fallopian tube was grasped with the Kleppinger forcep and cuaterized along a 4 cm length leaving 1 cm of normal proximal tube.  The same procedure was performed on the left tube.  The instruments were then removed and the pneumoperitoneum was released.  Because the fascia was very attenuated at the umbilical incision a subcutaneous stitch was placed at this site.  The skin at both trochar sites was closed with Dermabond. The Hulka tenaculum was removed.  All sponge and instrument counts were correct.  The patient tolerated the procedure well and left the operating room in good condition.  Linkon Siverson D. Deatra Ina, M.D.

## 2017-07-03 NOTE — Transfer of Care (Signed)
Immediate Anesthesia Transfer of Care Note  Patient: Sara [name]  Procedure(s) Performed: Procedure(s): LAPAROSCOPIC BILATERAL TUBAL LIGATION (Bilateral)  Patient Location: PACU  Anesthesia Type:General  Level of Consciousness: awake, alert  and oriented  Airway & Oxygen Therapy: Patient Spontanous Breathing and Patient connected to nasal cannula oxygen  Post-op Assessment: Report given to RN and Post -op Vital signs reviewed and stable  Post vital signs: Reviewed and stable  Last Vitals:  Vitals:   07/03/17 0750  BP: 118/81  Pulse: 79  Resp: 16  Temp: 36.7 C  SpO2: 98%    Last Pain:  Vitals:   07/03/17 0750  TempSrc: Oral      Patients Stated Pain Goal: 4 (94/17/40 8144)  Complications: No apparent anesthesia complications

## 2017-07-04 ENCOUNTER — Encounter (HOSPITAL_COMMUNITY): Payer: Self-pay | Admitting: Obstetrics & Gynecology

## 2017-07-30 ENCOUNTER — Telehealth: Payer: Self-pay | Admitting: Internal Medicine

## 2017-07-30 ENCOUNTER — Encounter (HOSPITAL_COMMUNITY): Payer: Self-pay

## 2017-07-30 DIAGNOSIS — Z5321 Procedure and treatment not carried out due to patient leaving prior to being seen by health care provider: Secondary | ICD-10-CM | POA: Insufficient documentation

## 2017-07-30 DIAGNOSIS — G43909 Migraine, unspecified, not intractable, without status migrainosus: Secondary | ICD-10-CM | POA: Insufficient documentation

## 2017-07-30 NOTE — ED Triage Notes (Signed)
Pt states that she has had a migraine since this morning when her CBG dropped, pt states she checked her BP at the store and it was 184/103. Pt has been taking medications. Took Fioricet tonight without relief.

## 2017-07-30 NOTE — Telephone Encounter (Signed)
Patient called that she is having migraine attack. On asking her SBp was in 180s. She was told to take an extra coreg and check her bp again. If her bp does not come down in 1-2 hours, she should have someone drive her to ED and get herself evaluated.

## 2017-07-31 ENCOUNTER — Emergency Department (HOSPITAL_COMMUNITY)
Admission: EM | Admit: 2017-07-31 | Discharge: 2017-07-31 | Discharge status: Against Medical Advice | Payer: Medicaid Other | Attending: Emergency Medicine | Admitting: Emergency Medicine

## 2017-07-31 NOTE — ED Notes (Signed)
Pt states that her migraine is gone and she thinks her BP is lower so she no longer wants to stay, advised pt that she was next. Pt states she will return if she feels worse.

## 2017-08-03 ENCOUNTER — Telehealth (HOSPITAL_COMMUNITY): Payer: Self-pay | Admitting: Cardiology

## 2017-08-03 NOTE — Telephone Encounter (Signed)
Agree. Can increase amlodipine if BP trends are >130. Thank you.   Arbutus Leas, NP-C

## 2017-08-03 NOTE — Telephone Encounter (Signed)
Advanced Heart Failure Triage Encounter  Patient Name: Sara [name]  Date of Call: 08/03/17  Problem:  Patient called to report elevated b/p reading three nights ago. Reports she went to the ER for b/p reading of 184/103, patient was having a migraine as well during this time. Patient did leave AMA as she began to fell better with migraine relief, b/p was 140/90   B/p today 143/95  Amlodipine 2.5 daily corlanor 7.5 BID Coreg 6.25 BID entresto 97/103 BID   Plan:  Advised to take medications as prescribed, track b/p reading over 7-10 days, if b/p stays elevated she can come into office for b/p check and possible med adjustment  Will forward to provider for further recommendation.  Kerry Dory, CMA

## 2017-08-03 NOTE — Telephone Encounter (Signed)
Pt aware and voiced understanding. Reports she will record b/p and call back for further instructions or follow up if needed

## 2017-08-29 ENCOUNTER — Other Ambulatory Visit (HOSPITAL_COMMUNITY): Payer: Self-pay | Admitting: Adult Health

## 2017-08-29 ENCOUNTER — Other Ambulatory Visit (HOSPITAL_COMMUNITY): Payer: Self-pay | Admitting: Internal Medicine

## 2017-09-03 ENCOUNTER — Encounter (HOSPITAL_COMMUNITY): Payer: Self-pay

## 2017-09-03 ENCOUNTER — Ambulatory Visit (HOSPITAL_COMMUNITY)
Admission: RE | Admit: 2017-09-03 | Discharge: 2017-09-03 | Disposition: A | Discharge status: Home / Self Care | Payer: Medicaid Other | Source: Ambulatory Visit | Attending: Cardiology | Admitting: Cardiology

## 2017-09-03 VITALS — BP 136/88 | HR 77 | Wt 194.2 lb

## 2017-09-03 DIAGNOSIS — I5022 Chronic systolic (congestive) heart failure: Secondary | ICD-10-CM | POA: Diagnosis not present

## 2017-09-03 DIAGNOSIS — Z79899 Other long term (current) drug therapy: Secondary | ICD-10-CM | POA: Diagnosis not present

## 2017-09-03 DIAGNOSIS — Z794 Long term (current) use of insulin: Secondary | ICD-10-CM | POA: Diagnosis not present

## 2017-09-03 DIAGNOSIS — Z8249 Family history of ischemic heart disease and other diseases of the circulatory system: Secondary | ICD-10-CM | POA: Diagnosis not present

## 2017-09-03 DIAGNOSIS — Z833 Family history of diabetes mellitus: Secondary | ICD-10-CM | POA: Insufficient documentation

## 2017-09-03 DIAGNOSIS — E049 Nontoxic goiter, unspecified: Secondary | ICD-10-CM | POA: Diagnosis not present

## 2017-09-03 DIAGNOSIS — R5383 Other fatigue: Secondary | ICD-10-CM | POA: Insufficient documentation

## 2017-09-03 DIAGNOSIS — I110 Hypertensive heart disease with heart failure: Secondary | ICD-10-CM | POA: Diagnosis not present

## 2017-09-03 DIAGNOSIS — Z9851 Tubal ligation status: Secondary | ICD-10-CM | POA: Insufficient documentation

## 2017-09-03 DIAGNOSIS — E1165 Type 2 diabetes mellitus with hyperglycemia: Secondary | ICD-10-CM | POA: Insufficient documentation

## 2017-09-03 DIAGNOSIS — Z8701 Personal history of pneumonia (recurrent): Secondary | ICD-10-CM | POA: Insufficient documentation

## 2017-09-03 DIAGNOSIS — I10 Essential (primary) hypertension: Secondary | ICD-10-CM

## 2017-09-03 DIAGNOSIS — E119 Type 2 diabetes mellitus without complications: Secondary | ICD-10-CM | POA: Diagnosis not present

## 2017-09-03 DIAGNOSIS — F419 Anxiety disorder, unspecified: Secondary | ICD-10-CM | POA: Diagnosis not present

## 2017-09-03 DIAGNOSIS — G43909 Migraine, unspecified, not intractable, without status migrainosus: Secondary | ICD-10-CM | POA: Diagnosis not present

## 2017-09-03 NOTE — Patient Instructions (Signed)
Follow up 4 months. We will call you closer to this time, or you may call our office to schedule 1 month before you are due to be seen. Take all medication as prescribed the day of your appointment. Bring all medications with you to your appointment.  Do the following things EVERYDAY: 1) Weigh yourself in the morning before breakfast. Write it down and keep it in a log. 2) Take your medicines as prescribed 3) Eat low salt foods-Limit salt (sodium) to 2000 mg per day.  4) Stay as active as you can everyday 5) Limit all fluids for the day to less than 2 liters

## 2017-09-03 NOTE — Progress Notes (Signed)
ADVANCED HF CLINIC NOTE  Patient ID: POETRY CERRO, female   DOB: May 01, 1986, 31 y.o.   MRN: [id number] PCP: Primary Cardiologist: Dr Haroldine Laws  HPI: Lakevia is a 31 year old with history of DM, HTN, and 2 pregnancies complicated by preeclampsia. S/P Tubal sterilization 06/2017.    Admitted  March 2017 with increased dsypnea and PND.  Prior  to admit she had a virus. CXR was concerning for CAP. Completed antibiotic course. ECHO performed and showed reduced EF -->35%. Diuresed with IV lasix. Instructed to stop breast feeding with addition of HF meds and to prevent pregnancy. Started on entresto, carvedilol and lasix. Discharge weight was 175 pounds.   Today she returns for HF follow up.Since the last visit she had tubal sterilization.  Overall feeling fine. Denies SOB/PND/Orthopnea. Appetite ok. No fever or chills. Weight at home 188-192 pounds. She has not had lasix in 4 months. Not exercising. Taking all medications.  cMRI 10/17 EF 37% RV normal ECHO 04/2016 ~35-40%  ECHO 12/2015 ~30-35%.  ECHO 04/2017 EF 30-35%.   CPX 7/17 FVC 2.68 (86%)    FEV1 2.43 (91%)       BP rest: 118/64 BP peak: 156/84 Peak VO2: 17.9 (63.9% predicted peak VO2) - corrects to 23.4 fo IBW VE/VCO2 slope: 31.2 OUES: 1.63 Peak RER: 1.17 Ventilatory Threshold: 13.9 (49.6% predicted or measured peak VO2) VE/MVV: 55.9% O2pulse: 10  (83% predicted O2pulse)  SH: Lives with her boyfriend and 2 children. She does not smoke or drink alcohol.   ROS: All systems negative except as listed in HPI, PMH and Problem List.  SH:  Social History   Socioeconomic History  . Marital status: Divorced    Spouse name: Not on file  . Number of children: 1  . Years of education: 72  . Highest education level: Not on file  Social Needs  . Financial resource strain: Not on file  . Food insecurity - worry: Not on file  . Food insecurity - inability: Not on file  . Transportation needs - medical: Not on file  .  Transportation needs - non-medical: Not on file  Occupational History  . Not on file  Tobacco Use  . Smoking status: Never Smoker  . Smokeless tobacco: Never Used  Substance and Sexual Activity  . Alcohol use: Yes    Alcohol/week: 0.0 oz    Comment: occ  . Drug use: No  . Sexual activity: Yes    Birth control/protection: Implant  Other Topics Concern  . Not on file  Social History Narrative   Lives with boyfriend and child   Is pregnant (20weeks) with second child   1st born is a boy and her second is a boy   Drinks one cup of coffee a day    FH:  Family History  Problem Relation Age of Onset  . Diabetes Mother   . Migraines Mother   . Hypertension Father   . Diabetes Maternal Grandmother   . Diabetes Paternal Grandmother     Past Medical History:  Diagnosis Date  . Anemia   . Anxiety    diagnosed in 2007  . CHF (congestive heart failure) ([place])   . Diabetes mellitus    type 2 - uncontrolled  . Enlarged thyroid   . Hypertension    benign essential  . Infected pilonidal cyst   . Migraine   . Non-reassuring fetal heart rate or rhythm affecting mother   . Pneumonia    history of  Current Outpatient Medications  Medication Sig Dispense Refill  . ALPRAZolam (XANAX) 0.5 MG tablet Take 0.5 mg by mouth 2 (two) times daily as needed for anxiety.    Marland Kitchen amLODipine (NORVASC) 2.5 MG tablet TAKE 1 TABLET(2.5 MG) BY MOUTH AT BEDTIME 30 tablet 3  . butalbital-acetaminophen-caffeine (FIORICET, ESGIC) 50-325-40 MG tablet Take 1 tablet by mouth every 4 (four) hours as needed for headache. 14 tablet 0  . carvedilol (COREG) 6.25 MG tablet TAKE 1 TABLET BY MOUTH TWICE DAILY WITH A MEAL. 60 tablet 3  . ENTRESTO 97-103 MG TAKE 1 TABLET BY MOUTH TWICE DAILY 180 tablet 3  . furosemide (LASIX) 20 MG tablet Take 1 tablet (20 mg total) by mouth as needed (for weight gain). (Patient taking differently: Take 20 mg by mouth daily as needed (for weight gain). ) 30 tablet 3  . insulin aspart  (NOVOLOG) 100 UNIT/ML injection Inject 0-30 Units into the skin See admin instructions. Dose per sliding scale. Before lunch and with supper.    . insulin glargine (LANTUS) 100 UNIT/ML injection Inject 30 Units at bedtime into the skin.     . Insulin Pen Needle (1ST CHOICE PEN NEEDLES) 31G X 6 MM MISC Use with insulin pen 100 each 0  . ivabradine (CORLANOR) 7.5 MG TABS tablet Take 1 tablet (7.5 mg total) by mouth 2 (two) times daily with a meal. 60 tablet 3  . Multiple Vitamin (MULTIVITAMIN WITH MINERALS) TABS tablet Take 1 tablet by mouth at bedtime.    Marland Kitchen oxyCODONE-acetaminophen (PERCOCET/ROXICET) 5-325 MG tablet Take 2 tablets by mouth every 4 (four) hours as needed for severe pain. 15 tablet 0  . spironolactone (ALDACTONE) 25 MG tablet TAKE 1 TABLET(25 MG) BY MOUTH DAILY (Patient taking differently: TAKE 1 TABLET(25 MG) BY MOUTH at bedtime) 30 tablet 3  . fluconazole (DIFLUCAN) 150 MG tablet Take 150 mg by mouth See admin instructions. Take 150mg  every other day for 2 doses, as needed for yeast infection     No current facility-administered medications for this encounter.     Vitals:   09/03/17 0843  BP: 136/88  Pulse: 77  SpO2: 100%  Weight: 194 lb 3.2 oz (88.1 kg)   Filed Weights   09/03/17 0843  Weight: 194 lb 3.2 oz (88.1 kg)   Filed Weights   09/03/17 0843  Weight: 194 lb 3.2 oz (88.1 kg)    PHYSICAL EXAM: General:  Well appearing. No resp difficulty HEENT: normal Neck: supple. no JVD. Carotids 2+ bilat; no bruits. No lymphadenopathy or thryomegaly appreciated. Cor: PMI nondisplaced. Regular rate & rhythm. No rubs, gallops or murmurs. Lungs: clear Abdomen: soft, nontender, nondistended. No hepatosplenomegaly. No bruits or masses. Good bowel sounds. Extremities: no cyanosis, clubbing, rash, edema Neuro: alert & orientedx3, cranial nerves grossly intact. moves all 4 extremities w/o difficulty. Affect pleasant   ASSESSMENT & PLAN: 1. Chronic Systolic CHF- 1/96/22[facility] EF  35%. - ? Viral Cardiomyopathy versus HTN. Also pre-eclampsia with both pregnancies.  ECHO (7/17) EF ->  35%. cMRI 10/17 EF 37%. ECHO 04/2017 EF 30-35%.  NYHA II. Volume status stable. Continue lasix as needed.  Continue current HF meds.  - On max dose on entresto.  - Continue 25 mg spiro daily.   -Continue corlanor 7.5 mg twice a day.  - She is intolerant of carvedilol more than 6.25 bid due to fatigue - Intolerant Bidil due to headache. Failed on 2 separate occasions.   2 HTN  -  Stable today.  3 DM-  Poorly  controlled. Per PCP.  4  OB-  S/P tubal sterilization 06/2017.   5. Fatigue- no sleep apnea noted on sleep study.   Encouraged to walk daily. Follow up in 4 months. Plan to check BMET at that time.    Khyri Hinzman NP-C ,8:46 AM

## 2017-09-04 ENCOUNTER — Encounter (HOSPITAL_COMMUNITY): Payer: Self-pay | Admitting: *Deleted

## 2017-09-04 NOTE — Progress Notes (Signed)
Received disability forms from [employer] for healthcare provider recommendation for accommodation.    Forms completed/sigend and faxed to [phone] on September 09, 2017.  Original request will be scanned to patient's electronic medical record.

## 2017-09-24 ENCOUNTER — Encounter (HOSPITAL_COMMUNITY): Payer: Self-pay

## 2017-09-24 ENCOUNTER — Telehealth (HOSPITAL_COMMUNITY): Payer: Self-pay | Admitting: *Deleted

## 2017-09-24 ENCOUNTER — Emergency Department (HOSPITAL_COMMUNITY): Payer: Medicaid Other

## 2017-09-24 ENCOUNTER — Emergency Department (HOSPITAL_COMMUNITY)
Admission: EM | Admit: 2017-09-24 | Discharge: 2017-09-24 | Disposition: A | Discharge status: Home / Self Care | Payer: Medicaid Other | Attending: Emergency Medicine | Admitting: Emergency Medicine

## 2017-09-24 ENCOUNTER — Other Ambulatory Visit: Payer: Self-pay

## 2017-09-24 DIAGNOSIS — Z79899 Other long term (current) drug therapy: Secondary | ICD-10-CM | POA: Insufficient documentation

## 2017-09-24 DIAGNOSIS — R002 Palpitations: Secondary | ICD-10-CM | POA: Diagnosis not present

## 2017-09-24 DIAGNOSIS — Z794 Long term (current) use of insulin: Secondary | ICD-10-CM | POA: Diagnosis not present

## 2017-09-24 DIAGNOSIS — I5022 Chronic systolic (congestive) heart failure: Secondary | ICD-10-CM | POA: Diagnosis not present

## 2017-09-24 DIAGNOSIS — Z91040 Latex allergy status: Secondary | ICD-10-CM | POA: Insufficient documentation

## 2017-09-24 DIAGNOSIS — R0789 Other chest pain: Secondary | ICD-10-CM | POA: Insufficient documentation

## 2017-09-24 DIAGNOSIS — E119 Type 2 diabetes mellitus without complications: Secondary | ICD-10-CM | POA: Insufficient documentation

## 2017-09-24 DIAGNOSIS — R079 Chest pain, unspecified: Secondary | ICD-10-CM

## 2017-09-24 DIAGNOSIS — I110 Hypertensive heart disease with heart failure: Secondary | ICD-10-CM | POA: Diagnosis not present

## 2017-09-24 LAB — BASIC METABOLIC PANEL
ANION GAP: 6 (ref 5–15)
BUN: 7 mg/dL (ref 6–20)
CHLORIDE: 105 mmol/L (ref 101–111)
CO2: 25 mmol/L (ref 22–32)
Calcium: 9.6 mg/dL (ref 8.9–10.3)
Creatinine, Ser: 0.71 mg/dL (ref 0.44–1.00)
GFR calc Af Amer: >60 mL/min (ref >60)
GLUCOSE: 97 mg/dL (ref 65–99)
POTASSIUM: 4 mmol/L (ref 3.5–5.1)
Sodium: 136 mmol/L (ref 135–145)

## 2017-09-24 LAB — CBC
HEMATOCRIT: 38.1 % (ref 36.0–46.0)
Hemoglobin: 12.3 g/dL (ref 12.0–15.0)
MCH: 27.2 pg (ref 26.0–34.0)
MCHC: 32.3 g/dL (ref 30.0–36.0)
MCV: 84.1 fL (ref 78.0–100.0)
Platelets: 374 10*3/uL (ref 150–400)
RBC: 4.53 MIL/uL (ref 3.87–5.11)
RDW: 13.1 % (ref 11.5–15.5)
WBC: 13.8 10*3/uL — ABNORMAL HIGH (ref 4.0–10.5)

## 2017-09-24 LAB — I-STAT BETA HCG BLOOD, ED (MC, WL, AP ONLY): I-stat hCG, quantitative: <5 m[IU]/mL (ref <5)

## 2017-09-24 LAB — I-STAT TROPONIN, ED: Troponin i, poc: 0 ng/mL (ref 0.00–0.08)

## 2017-09-24 NOTE — Telephone Encounter (Signed)
Patient called c/o of chest pain. Chest pain for the past week but worse today. PT advised to go to the ER. Pt is agreeable.

## 2017-09-24 NOTE — ED Provider Notes (Signed)
[place] EMERGENCY DEPARTMENT Provider Note   CSN: [id number] Arrival date & time: 09/24/17  1349     History   Chief Complaint Chief Complaint  Patient presents with  . Palpitations  . Chest Pain    HPI Sara [name] is a 31 y.o. female.  Central chest pain since early this morning with associated palpitations for approximately 1 week.  Patient reports a history of congestive heart failure precipitated by pneumonia with an ejection fraction of "35%".  Her blood pressure this morning at [facility] was 150/107.  Cardiac risk factors include diabetes, hypertension.  She describes the palpitations as a "few per hour".  Severity is mild to moderate.  Nothing makes symptoms better or worse      Past Medical History:  Diagnosis Date  . Anemia   . Anxiety    diagnosed in 2007  . CHF (congestive heart failure) ([place])   . Diabetes mellitus    type 2 - uncontrolled  . Enlarged thyroid   . Hypertension    benign essential  . Infected pilonidal cyst   . Migraine   . Non-reassuring fetal heart rate or rhythm affecting mother   . Pneumonia    history of     Patient Active Problem List   Diagnosis Date Noted  . Chronic systolic CHF (congestive heart failure), NYHA class 3 ([place]) 02/09/2016  . HTN, goal below 130/80 02/09/2016  . Essential hypertension   . Breastfeeding (infant) 01/27/2016  . Abnormal EKG 01/27/2016  . Preterm spontaneous labor with preterm delivery 04/02/2015  . Normal vaginal delivery 04/01/2015  . Preterm premature rupture of membranes (PPROM) with onset of labor within 24 hours of rupture in first trimester, antepartum 03/31/2015  . Pre-eclampsia in third trimester 03/16/2015  . Pre-existing type 2 diabetes mellitus in pregnancy in third trimester   . Preterm contractions 03/13/2015  . Diabetes mellitus with insulin therapy ([place]) 03/12/2015  . BMI 36.0-36.9,adult 03/12/2015  . Herpes simplex type 2 (HSV-2) infection affecting  pregnancy, antepartum 03/12/2015  . Hemorrhoids 03/12/2015  . Latex allergy - rash 03/12/2015  . 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 History of migraine headaches - with aura 12/12/2014  . History of preterm delivery, currently pregnant 12/05/2014  . Generalized anxiety disorder 12/05/2014  . Anemia 12/05/2014  . Pilonidal abscess 09/30/2012    Past Surgical History:  Procedure Laterality Date  . LAPAROSCOPIC TUBAL LIGATION Bilateral 07/03/2017   Procedure: LAPAROSCOPIC BILATERAL TUBAL LIGATION;  Surgeon: Alden Hipp, MD;  Location: [facility];  Service: Gynecology;  Laterality: Bilateral;  . NO PAST SURGERIES      OB History    Gravida Para Term Preterm AB Living   3 2   2 1 1    SAB TAB Ectopic Multiple Live Births   1     0 1       Home Medications    Prior to Admission medications   Medication Sig Start Date End Date Taking? Authorizing Provider  ALPRAZolam Duanne Moron) 0.5 MG tablet Take 0.5 mg by mouth 2 (two) times daily as needed for anxiety.    [provider]  amLODipine (NORVASC) 2.5 MG tablet TAKE 1 TABLET(2.5 MG) BY MOUTH AT BEDTIME 08/30/17   Bensimhon, Shaune Pascal, MD  butalbital-acetaminophen-caffeine (FIORICET, ESGIC) [phone] MG tablet Take 1 tablet by mouth every 4 (four) hours as needed for headache. 02/02/16   Theodis Blaze, MD  carvedilol (  COREG) 6.25 MG tablet TAKE 1 TABLET BY MOUTH TWICE DAILY WITH A MEAL. 08/30/17   Bensimhon, Shaune Pascal, MD  ENTRESTO 97-103 MG TAKE 1 TABLET BY MOUTH TWICE DAILY 01/04/17   Bensimhon, Shaune Pascal, MD  fluconazole (DIFLUCAN) 150 MG tablet Take 150 mg by mouth See admin instructions. Take 150mg  every other day for 2 doses, as needed for yeast infection    [provider]  furosemide (LASIX) 20 MG tablet Take 1 tablet (20 mg total) by mouth as needed (for weight gain). Patient taking differently: Take 20 mg by mouth daily as needed (for weight gain).   06/12/17 09/10/17  Rogelia Mire, NP  insulin aspart (NOVOLOG) 100 UNIT/ML injection Inject 0-30 Units into the skin See admin instructions. Dose per sliding scale. Before lunch and with supper.    [provider]  insulin glargine (LANTUS) 100 UNIT/ML injection Inject 30 Units at bedtime into the skin.     [provider]  Insulin Pen Needle (1ST CHOICE PEN NEEDLES) 31G X 6 MM MISC Use with insulin pen 08/08/14   Harden Mo, MD  ivabradine (CORLANOR) 7.5 MG TABS tablet Take 1 tablet (7.5 mg total) by mouth 2 (two) times daily with a meal. 05/21/17   Bensimhon, Shaune Pascal, MD  Multiple Vitamin (MULTIVITAMIN WITH MINERALS) TABS tablet Take 1 tablet by mouth at bedtime.    [provider]  oxyCODONE-acetaminophen (PERCOCET/ROXICET) 5-325 MG tablet Take 2 tablets by mouth every 4 (four) hours as needed for severe pain. 07/03/17   Alden Hipp, MD  spironolactone (ALDACTONE) 25 MG tablet TAKE 1 TABLET(25 MG) BY MOUTH DAILY Patient taking differently: TAKE 1 TABLET(25 MG) BY MOUTH at bedtime 05/21/17   Bensimhon, Shaune Pascal, MD    Family History Family History  Problem Relation Age of Onset  . Diabetes Mother   . Migraines Mother   . Hypertension Father   . Diabetes Maternal Grandmother   . Diabetes Paternal Grandmother     Social History Social History   Tobacco Use  . Smoking status: Never Smoker  . Smokeless tobacco: Never Used  Substance Use Topics  . Alcohol use: Yes    Alcohol/week: 0.0 oz    Comment: occ  . Drug use: No     Allergies   Septra ds [sulfamethoxazole-trimethoprim]; Bidil [isosorb dinitrate-hydralazine]; Lactose intolerance (gi); Lobster [shellfish allergy]; Pineapple; Latex; Tape; and Terconazole   Review of Systems Review of Systems  All other systems reviewed and are negative.    Physical Exam Updated Vital Signs BP 131/66   Pulse 83   Temp 98.1 F (36.7 C) (Oral)   Resp (!) 23   SpO2 99%   Physical Exam    Constitutional: She is oriented to person, place, and time. She appears well-developed and well-nourished.  nad  HENT:  Head: Normocephalic and atraumatic.  Eyes: Conjunctivae are normal.  Neck: Neck supple.  Cardiovascular: Normal rate and regular rhythm.  Pulmonary/Chest: Effort normal and breath sounds normal.  Abdominal: Soft. Bowel sounds are normal.  Musculoskeletal: Normal range of motion.  Neurological: She is alert and oriented to person, place, and time.  Skin: Skin is warm and dry.  Psychiatric: She has a normal mood and affect. Her behavior is normal.  Nursing note and vitals reviewed.    ED Treatments / Results  Labs (all labs ordered are listed, but only abnormal results are displayed) Labs Reviewed  CBC - Abnormal; Notable for the following components:      Result Value  WBC 13.8 (*)    All other components within normal limits  BASIC METABOLIC PANEL  I-STAT TROPONIN, ED  I-STAT BETA HCG BLOOD, ED ([facility], [facility], [facility] ONLY)    EKG  EKG Interpretation None       Radiology Dg Chest 2 View  Result Date: 09/24/2017 CLINICAL DATA:  Chest pain for the past day. EXAM: CHEST  2 VIEW COMPARISON:  Chest x-ray dated December 20, 2016. FINDINGS: The heart size and mediastinal contours are within normal limits. Both lungs are clear. The visualized skeletal structures are unremarkable. IMPRESSION: No active cardiopulmonary disease. Electronically Signed   By: Titus Dubin M.D.   On: 09/24/2017 15:04    Procedures Procedures (including critical care time)  Medications Ordered in ED Medications - No data to display   Initial Impression / Assessment and Plan / ED Course  I have reviewed the triage vital signs and the nursing notes.  Pertinent labs & imaging results that were available during my care of the patient were reviewed by me and considered in my medical decision making (see chart for details).     Patient presents with palpitations.  She has a history of  congestive heart failure.  She is hemodynamically stable in the ED.  Screening labs, chest x-ray, EKG all acceptable.  Patient will follow up with her cardiologist this week.  Final Clinical Impressions(s) / ED Diagnoses   Final diagnoses:  Chest pain, unspecified type  Palpitations    ED Discharge Orders    None       Nat Christen, MD 09/25/17 [phone]

## 2017-09-24 NOTE — ED Triage Notes (Signed)
Pt presents for central chest pain starting today. States has felt palpitations x 3-4 days. Pt reports hx of CHF, no routine medications for CHF. Denies extremity edema.

## 2017-09-24 NOTE — Discharge Instructions (Signed)
Tests showed no life-threatening condition.  I did send a message through the [facility] system to your cardiologist.

## 2017-09-25 ENCOUNTER — Ambulatory Visit (INDEPENDENT_AMBULATORY_CARE_PROVIDER_SITE_OTHER): Payer: Medicaid Other

## 2017-09-25 ENCOUNTER — Ambulatory Visit (HOSPITAL_COMMUNITY)
Admission: RE | Admit: 2017-09-25 | Discharge: 2017-09-25 | Disposition: A | Discharge status: Home / Self Care | Payer: Medicaid Other | Source: Ambulatory Visit | Attending: Internal Medicine | Admitting: Internal Medicine

## 2017-09-25 ENCOUNTER — Other Ambulatory Visit (HOSPITAL_COMMUNITY): Payer: Self-pay | Admitting: Internal Medicine

## 2017-09-25 ENCOUNTER — Encounter (HOSPITAL_COMMUNITY): Payer: Self-pay

## 2017-09-25 VITALS — BP 138/80 | HR 73 | Wt 193.0 lb

## 2017-09-25 DIAGNOSIS — F419 Anxiety disorder, unspecified: Secondary | ICD-10-CM | POA: Insufficient documentation

## 2017-09-25 DIAGNOSIS — I10 Essential (primary) hypertension: Secondary | ICD-10-CM

## 2017-09-25 DIAGNOSIS — I5022 Chronic systolic (congestive) heart failure: Secondary | ICD-10-CM | POA: Insufficient documentation

## 2017-09-25 DIAGNOSIS — Z79891 Long term (current) use of opiate analgesic: Secondary | ICD-10-CM | POA: Diagnosis not present

## 2017-09-25 DIAGNOSIS — E119 Type 2 diabetes mellitus without complications: Secondary | ICD-10-CM | POA: Diagnosis not present

## 2017-09-25 DIAGNOSIS — R002 Palpitations: Secondary | ICD-10-CM | POA: Diagnosis not present

## 2017-09-25 DIAGNOSIS — R5383 Other fatigue: Secondary | ICD-10-CM | POA: Diagnosis not present

## 2017-09-25 DIAGNOSIS — Z79899 Other long term (current) drug therapy: Secondary | ICD-10-CM | POA: Insufficient documentation

## 2017-09-25 DIAGNOSIS — Z794 Long term (current) use of insulin: Secondary | ICD-10-CM | POA: Diagnosis not present

## 2017-09-25 DIAGNOSIS — Z9851 Tubal ligation status: Secondary | ICD-10-CM | POA: Diagnosis not present

## 2017-09-25 DIAGNOSIS — I110 Hypertensive heart disease with heart failure: Secondary | ICD-10-CM | POA: Diagnosis not present

## 2017-09-25 NOTE — Progress Notes (Signed)
Advanced Heart Failure Clinic Note   Patient ID: Sara [name], female   DOB: Oct 13, 1986, 31 y.o.   MRN: [id number] PCP: Primary Cardiologist: Dr Haroldine Laws  HPI: Sara [name] is a 31 y.o. female with history of DM, HTN, and 2 pregnancies complicated by preeclampsia. S/P Tubal sterilization 06/2017.    Admitted  March 2017 with increased dsypnea and PND.  Prior  to admit she had a virus. CXR was concerning for CAP. Completed antibiotic course. ECHO performed and showed reduced EF -->35%. Diuresed with IV lasix. Instructed to stop breast feeding with addition of HF meds and to prevent pregnancy. Started on entresto, carvedilol and lasix. Discharge weight was 175 pounds.   Pt presents today for post ED follow up. Presented yesterday with non-specific chest pain. She states it was a 2-3/10 that she noticed in the setting of palpitations.  She had mild orthopnea overnight.  Work up unremarkable in ED. She has been taking lasix as needed, and took one after Thanksgiving as instructed, but not because she felt as if she needed it. Not very active. Works at [employer], so relatively sedentary.  Taking all medications as directed.  Her children have recently been sick with URIs, but pt herself has had no cough, fevers, chills, or other URI symptoms.   cMRI 10/17 EF 37% RV normal ECHO 04/2016 ~35-40%  ECHO 12/2015 ~30-35%.  ECHO 04/2017 EF 30-35%.   CPX 7/17 FVC 2.68 (86%)    FEV1 2.43 (91%)       BP rest: 118/64 BP peak: 156/84 Peak VO2: 17.9 (63.9% predicted peak VO2) - corrects to 23.4 fo IBW VE/VCO2 slope: 31.2 OUES: 1.63 Peak RER: 1.17 Ventilatory Threshold: 13.9 (49.6% predicted or measured peak VO2) VE/MVV: 55.9% O2pulse: 10  (83% predicted O2pulse)  SH: Lives with her boyfriend and 2 children. She does not smoke or drink alcohol.   Review of systems complete and found to be negative unless listed in HPI.    SH:  Social History   Socioeconomic History  . Marital  status: Divorced    Spouse name: Not on file  . Number of children: 1  . Years of education: 65  . Highest education level: Not on file  Social Needs  . Financial resource strain: Not on file  . Food insecurity - worry: Not on file  . Food insecurity - inability: Not on file  . Transportation needs - medical: Not on file  . Transportation needs - non-medical: Not on file  Occupational History  . Not on file  Tobacco Use  . Smoking status: Never Smoker  . Smokeless tobacco: Never Used  Substance and Sexual Activity  . Alcohol use: Yes    Alcohol/week: 0.0 oz    Comment: occ  . Drug use: No  . Sexual activity: Yes    Birth control/protection: Implant  Other Topics Concern  . Not on file  Social History Narrative   Lives with boyfriend and child   Is pregnant (20weeks) with second child   1st born is a boy and her second is a boy   Drinks one cup of coffee a day    FH:  Family History  Problem Relation Age of Onset  . Diabetes Mother   . Migraines Mother   . Hypertension Father   . Diabetes Maternal Grandmother   . Diabetes Paternal Grandmother     Past Medical History:  Diagnosis Date  . Anemia   . Anxiety    diagnosed in 2007  .  CHF (congestive heart failure) ([place])   . Diabetes mellitus    type 2 - uncontrolled  . Enlarged thyroid   . Hypertension    benign essential  . Infected pilonidal cyst   . Migraine   . Non-reassuring fetal heart rate or rhythm affecting mother   . Pneumonia    history of     Current Outpatient Medications  Medication Sig Dispense Refill  . ALPRAZolam (XANAX) 0.5 MG tablet Take 0.5 mg by mouth 2 (two) times daily as needed for anxiety.    Marland Kitchen amLODipine (NORVASC) 2.5 MG tablet TAKE 1 TABLET(2.5 MG) BY MOUTH AT BEDTIME 30 tablet 3  . butalbital-acetaminophen-caffeine (FIORICET, ESGIC) 50-325-40 MG tablet Take 1 tablet by mouth every 4 (four) hours as needed for headache. 14 tablet 0  . carvedilol (COREG) 6.25 MG tablet TAKE 1 TABLET  BY MOUTH TWICE DAILY WITH A MEAL. 60 tablet 3  . ENTRESTO 97-103 MG TAKE 1 TABLET BY MOUTH TWICE DAILY 180 tablet 3  . fluconazole (DIFLUCAN) 150 MG tablet Take 150 mg by mouth See admin instructions. Take 150mg  every other day for 2 doses, as needed for yeast infection    . insulin aspart (NOVOLOG) 100 UNIT/ML injection Inject 0-30 Units into the skin See admin instructions. Dose per sliding scale. Before lunch and with supper.    . insulin glargine (LANTUS) 100 UNIT/ML injection Inject 30 Units at bedtime into the skin.     . Insulin Pen Needle (1ST CHOICE PEN NEEDLES) 31G X 6 MM MISC Use with insulin pen 100 each 0  . ivabradine (CORLANOR) 7.5 MG TABS tablet Take 1 tablet (7.5 mg total) by mouth 2 (two) times daily with a meal. 60 tablet 3  . Multiple Vitamin (MULTIVITAMIN WITH MINERALS) TABS tablet Take 1 tablet by mouth at bedtime.    Marland Kitchen oxyCODONE-acetaminophen (PERCOCET/ROXICET) 5-325 MG tablet Take 2 tablets by mouth every 4 (four) hours as needed for severe pain. 15 tablet 0  . spironolactone (ALDACTONE) 25 MG tablet TAKE 1 TABLET(25 MG) BY MOUTH DAILY (Patient taking differently: TAKE 1 TABLET(25 MG) BY MOUTH at bedtime) 30 tablet 3  . furosemide (LASIX) 20 MG tablet Take 1 tablet (20 mg total) by mouth as needed (for weight gain). (Patient taking differently: Take 20 mg by mouth daily as needed (for weight gain). ) 30 tablet 3   No current facility-administered medications for this encounter.    Vitals:   09/25/17 1349  BP: 138/80  Pulse: 73  SpO2: 99%  Weight: 193 lb (87.5 kg)   Wt Readings from Last 3 Encounters:  09/25/17 193 lb (87.5 kg)  09/03/17 194 lb 3.2 oz (88.1 kg)  06/26/17 192 lb 2 oz (87.1 kg)    PHYSICAL EXAM: General: Well appearing. No resp difficulty. HEENT: Normal Neck: Supple. JVP 5-6. Carotids 2+ bilat; no bruits. No thyromegaly or nodule noted. Cor: PMI nondisplaced. RRR, No M/G/R noted Lungs: CTAB, normal effort. Abdomen: Soft, non-tender,  non-distended, no HSM. No bruits or masses. +BS  Extremities: No cyanosis, clubbing, or rash. R and LLE no edema.  Neuro: Alert & orientedx3, cranial nerves grossly intact. moves all 4 extremities w/o difficulty. Affect pleasant   ASSESSMENT & PLAN: 1. Chronic Systolic CHF- 9/14/78GNFA EF 35%. - ? Viral Cardiomyopathy versus HTN. Also pre-eclampsia with both pregnancies.  ECHO (7/17) EF ->  35%. cMRI 10/17 EF 37%. ECHO 04/2017 EF 30-35%.  - NYHA II symptoms - Volume status looks stable to mildly elevated on exam.  - Continue  lasix 20 mg as needed. Instructed to take the next 1-2 days to see if improves orthopnea.  - Continue Entresto 97/93 mg BID. - Continue corlanor 7.5 mg twice a day.  - She is intolerant of carvedilol more than 6.25 bid due to fatigue - Intolerant Bidil due to headache. Failed on 2 separate occasions.   - Reinforced fluid restriction to < 2 L daily, sodium restriction to less than 2000 mg daily, and the importance of daily weights.   2 HTN  - Mildly elevated today, but typically runs 110-120s and pt very anxious.  3 DM - Control improved. Per PCP.  4  OB - S/P tubal sterilization 06/2017.   5. Fatigue - no sleep apnea noted on sleep study. No change.  6. Palpitations - Worse over the past week with no clear trigger, but slightly symptomatic.  - Will place 48 hr monitor to assess burden and follow up.   RTC 2 months with MD in the absence of significant results for holter monitor. Pt knows to call sooner with any recurrent or worsening symptoms.   Shirley Friar, PA-C   1:57 PM  Greater than 50% of the 25 minute visit was spent in counseling/coordination of care regarding disease state education, salt/fluid restriction, sliding scale diuretics, and medication compliance.

## 2017-09-25 NOTE — Patient Instructions (Addendum)
Will schedule you for a holter monitor (heart monitor) to be placed at [facility] office. Address: [address] ([place]), [place], [place] [address]  Phone: [phone] This office will call you to schedule this appointment. Our office will contact you to give you the results by phone once the monitor is returned.  Follow up 2 months with Dr. Haroldine Laws.  ________________________________________________________ Sara [name] Code: 9000  Take all medication as prescribed the day of your appointment. Bring all medications with you to your appointment.  Do the following things EVERYDAY: 1) Weigh yourself in the morning before breakfast. Write it down and keep it in a log. 2) Take your medicines as prescribed 3) Eat low salt foods-Limit salt (sodium) to 2000 mg per day.  4) Stay as active as you can everyday 5) Limit all fluids for the day to less than 2 liters

## 2017-11-29 ENCOUNTER — Encounter (HOSPITAL_COMMUNITY): Payer: Medicaid Other | Admitting: Internal Medicine

## 2017-12-05 ENCOUNTER — Encounter (HOSPITAL_COMMUNITY): Payer: Self-pay | Admitting: Internal Medicine

## 2017-12-05 ENCOUNTER — Ambulatory Visit (HOSPITAL_COMMUNITY)
Admission: RE | Admit: 2017-12-05 | Discharge: 2017-12-05 | Disposition: A | Discharge status: Home / Self Care | Payer: Medicaid Other | Source: Ambulatory Visit | Attending: Internal Medicine | Admitting: Internal Medicine

## 2017-12-05 VITALS — BP 144/85 | HR 77 | Wt 193.0 lb

## 2017-12-05 DIAGNOSIS — I5022 Chronic systolic (congestive) heart failure: Secondary | ICD-10-CM | POA: Diagnosis not present

## 2017-12-05 DIAGNOSIS — E119 Type 2 diabetes mellitus without complications: Secondary | ICD-10-CM | POA: Diagnosis not present

## 2017-12-05 DIAGNOSIS — Z79899 Other long term (current) drug therapy: Secondary | ICD-10-CM | POA: Insufficient documentation

## 2017-12-05 DIAGNOSIS — I110 Hypertensive heart disease with heart failure: Secondary | ICD-10-CM | POA: Insufficient documentation

## 2017-12-05 DIAGNOSIS — R05 Cough: Secondary | ICD-10-CM | POA: Diagnosis not present

## 2017-12-05 DIAGNOSIS — R002 Palpitations: Secondary | ICD-10-CM | POA: Diagnosis not present

## 2017-12-05 DIAGNOSIS — R0609 Other forms of dyspnea: Secondary | ICD-10-CM | POA: Insufficient documentation

## 2017-12-05 DIAGNOSIS — Z9851 Tubal ligation status: Secondary | ICD-10-CM | POA: Insufficient documentation

## 2017-12-05 DIAGNOSIS — I493 Ventricular premature depolarization: Secondary | ICD-10-CM | POA: Insufficient documentation

## 2017-12-05 DIAGNOSIS — Z794 Long term (current) use of insulin: Secondary | ICD-10-CM | POA: Insufficient documentation

## 2017-12-05 DIAGNOSIS — F419 Anxiety disorder, unspecified: Secondary | ICD-10-CM | POA: Insufficient documentation

## 2017-12-05 DIAGNOSIS — I10 Essential (primary) hypertension: Secondary | ICD-10-CM | POA: Diagnosis not present

## 2017-12-05 DIAGNOSIS — G43909 Migraine, unspecified, not intractable, without status migrainosus: Secondary | ICD-10-CM | POA: Insufficient documentation

## 2017-12-05 MED ORDER — AMLODIPINE BESYLATE 5 MG PO TABS: 5.0000 mg | ORAL_TABLET | Freq: Every day | ORAL | 6 refills | Status: DC

## 2017-12-05 NOTE — Progress Notes (Signed)
Advanced Heart Failure Clinic Note   Patient ID: Sara [name], female   DOB: [birth year], 30 y.o.   MRN: [id number] PCP: Primary Cardiologist: Dr Haroldine Laws  HPI: Sara [name] is a 32 y.o. 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 S/P Tubal ligation 06/2017.    Admitted  March 2017 with increased dsypnea and PND.  Prior  to admit she had a virus. CXR was concerning for CAP. Completed antibiotic course. ECHO performed and showed reduced EF -->35%. Diuresed with IV lasix. Instructed to stop breast feeding with addition of HF meds and to prevent pregnancy. Started on entresto, carvedilol and lasix. Discharge weight was 175 pounds.   She returns today for HF follow up. Overall doing ok.  No CP or palpitations. SOB with stairs and 5+ min walking. She got laryngitis about 1 week ago and has had a dry cough for 3-4 days with rare productive yellow sputum. Orthopneic x1 week. No PND. She has no energy. She says she is compliant with low salt diet and fluid restriction. Has not missed meds. Takes extra lasix once every 3-4 months. None recently. Weights 191-194 lbs at home. Weighs once per week. No BP checks. Has had worse migraines over the last two weeks. No dizziness or lightheadedness. Would like a handicap sticker.  Bedside echo today 45%  cMRI 10/17 EF 37% RV normal ECHO 04/2016 ~35-40%  ECHO 12/2015 ~30-35%.  ECHO 04/2017 EF 30-35%.   CPX 7/17 FVC 2.68 (86%)    FEV1 2.43 (91%)       BP rest: 118/64 BP peak: 156/84 Peak VO2: 17.9 (63.9% predicted peak VO2) - corrects to 23.4 fo IBW VE/VCO2 slope: 31.2 OUES: 1.63 Peak RER: 1.17 Ventilatory Threshold: 13.9 (49.6% predicted or measured peak VO2) VE/MVV: 55.9% O2pulse: 10  (83% predicted O2pulse)  SH: Lives with her boyfriend and 2 children. She does not smoke or drink alcohol.   Review of systems complete and found to be negative unless  listed in HPI.    SH:  Social History   Socioeconomic History  . Marital status: Divorced    Spouse name: Not on file  . Number of children: 1  . Years of education: 19  . Highest education level: Not on file  Social Needs  . Financial resource strain: Not on file  . Food insecurity - worry: Not on file  . Food insecurity - inability: Not on file  . Transportation needs - medical: Not on file  . Transportation needs - non-medical: Not on file  Occupational History  . Not on file  Tobacco Use  . Smoking status: Never Smoker  . Smokeless tobacco: Never Used  Substance and Sexual Activity  . Alcohol use: Yes    Alcohol/week: 0.0 oz    Comment: occ  . Drug use: No  . Sexual activity: Yes    Birth control/protection: Implant  Other Topics Concern  . Not on file  Social History Narrative   Lives with boyfriend and child   Is pregnant (20weeks) with second child   1st born is a boy and her second is a boy   Drinks one cup of coffee a day    FH:  Family History  Problem Relation Age of Onset  . Diabetes Mother   . Migraines Mother   . Hypertension Father   . Diabetes Maternal Grandmother   . Diabetes Paternal Grandmother     Past Medical  History:  Diagnosis Date  . Anemia   . Anxiety    diagnosed in 2007  . CHF (congestive heart failure) ([place])   . Diabetes mellitus    type 2 - uncontrolled  . Enlarged thyroid   . Hypertension    benign essential  . Infected pilonidal cyst   . Migraine   . Non-reassuring fetal heart rate or rhythm affecting mother   . Pneumonia    history of     Current Outpatient Medications  Medication Sig Dispense Refill  . ALPRAZolam (XANAX) 0.5 MG tablet Take 0.5 mg by mouth 2 (two) times daily as needed for anxiety.    Marland Kitchen amLODipine (NORVASC) 2.5 MG tablet TAKE 1 TABLET(2.5 MG) BY MOUTH AT BEDTIME 30 tablet 3  . butalbital-acetaminophen-caffeine (FIORICET, ESGIC) 50-325-40 MG tablet Take 1 tablet by mouth every 4 (four) hours as  needed for headache. 14 tablet 0  . carvedilol (COREG) 6.25 MG tablet TAKE 1 TABLET BY MOUTH TWICE DAILY WITH A MEAL. 60 tablet 3  . ENTRESTO 97-103 MG TAKE 1 TABLET BY MOUTH TWICE DAILY 180 tablet 3  . fluconazole (DIFLUCAN) 150 MG tablet Take 150 mg by mouth See admin instructions. Take 150mg  every other day for 2 doses, as needed for yeast infection    . furosemide (LASIX) 20 MG tablet Take 1 tablet (20 mg total) by mouth as needed (for weight gain). (Patient taking differently: Take 20 mg by mouth daily as needed (for weight gain). ) 30 tablet 3  . insulin aspart (NOVOLOG) 100 UNIT/ML injection Inject 0-30 Units into the skin See admin instructions. Dose per sliding scale. Before lunch and with supper.    . insulin glargine (LANTUS) 100 UNIT/ML injection Inject 30 Units at bedtime into the skin.     . Insulin Pen Needle (1ST CHOICE PEN NEEDLES) 31G X 6 MM MISC Use with insulin pen 100 each 0  . ivabradine (CORLANOR) 7.5 MG TABS tablet Take 1 tablet (7.5 mg total) by mouth 2 (two) times daily with a meal. 60 tablet 3  . Multiple Vitamin (MULTIVITAMIN WITH MINERALS) TABS tablet Take 1 tablet by mouth at bedtime.    Marland Kitchen oxyCODONE-acetaminophen (PERCOCET/ROXICET) 5-325 MG tablet Take 2 tablets by mouth every 4 (four) hours as needed for severe pain. 15 tablet 0  . spironolactone (ALDACTONE) 25 MG tablet TAKE 1 TABLET(25 MG) BY MOUTH DAILY 30 tablet 3   No current facility-administered medications for this encounter.    Vitals:   12/05/17 1412  BP: (!) 144/85  Pulse: 77  SpO2: 100%  Weight: 193 lb (87.5 kg)   Wt Readings from Last 3 Encounters:  12/05/17 193 lb (87.5 kg)  09/25/17 193 lb (87.5 kg)  09/03/17 194 lb 3.2 oz (88.1 kg)    PHYSICAL EXAM: General: Well appearing. No resp difficulty. HEENT: Normal Neck: Supple. JVP 5-6. Carotids 2+ bilat; no bruits. No thyromegaly or nodule noted. Cor: PMI nondisplaced. RRR, No M/G/R noted Lungs: diminished throughout. Nonproductive  cough Abdomen: Soft, non-tender, non-distended, no HSM. No bruits or masses. +BS  Extremities: No cyanosis, clubbing, or rash. R and LLE no edema.  Neuro: Alert & orientedx3, cranial nerves grossly intact. moves all 4 extremities w/o difficulty. Affect pleasant   ASSESSMENT & PLAN: 1. Chronic Systolic CHF- 6/27/03JKKX EF 35%. - ? Viral Cardiomyopathy versus HTN. Also pre-eclampsia with both pregnancies.  ECHO (7/17) EF ->  35%. cMRI 10/17 EF 37%. ECHO 04/2017 EF 30-35%.  - NYHA II symptoms - Volume status looks stable  to mildly elevated on exam.  - Continue lasix 20 mg as needed. She has a hard time knowing when to take extra. - Continue Entresto 97/103 mg BID. - Continue corlanor 7.5 mg twice a day.  - Continue spiro 25 mg daily. - She is intolerant of carvedilol more than 6.25 bid due to fatigue.  - Intolerant Bidil due to headache. Failed on 2 separate occasions.   - Reinforced fluid restriction to < 2 L daily, sodium restriction to less than 2000 mg daily, and the importance of daily weights.   2 HTN  - 140/80's today. She does not check at home. - Increase amlodipine to 5 mg daily. 3 DM - Control improved. Per PCP.  4  OB - S/P tubal sterilization 06/2017.   5. Fatigue - No sleep apnea noted on sleep study. No change.  6. Palpitations - Holter monitor 08/2017 showed NSR with frequent PVCs (2%) with "fluttering" diary entries correlating with bigeminy - No further palpitations  Repeat formal echo in 2 weeks. Increase amlodipine to 5 mg daily.  Georgiana Shore, NP   2:29 PM   Patient seen and examined with the above-signed Advanced Practice Provider and/or Housestaff. I personally reviewed laboratory data, imaging studies and relevant notes. I independently examined the patient and formulated the important aspects of the plan. I have edited the note to reflect any of my changes or salient points. I have personally discussed the plan with the patient and/or family.  Overall  doing ok. NYHA II-possibly early III. Volume status looks good. Bedside echo done personally shows EF ~45%. Appears that subjective findings worse than objective. On good HF meds. Cannot tolerated Bidil. BP still up. Will increase amlodipine. Encouraged her to be more active. Will schedule formal echo.   Glori Bickers, MD  3:11 PM

## 2017-12-05 NOTE — Patient Instructions (Signed)
Increase Amlodipine to 5 mg daily  Your physician has requested that you have an echocardiogram. Echocardiography is a painless test that uses sound waves to create images of your heart. It provides your doctor with information about the size and shape of your heart and how well your heart's chambers and valves are working. This procedure takes approximately one hour. There are no restrictions for this procedure. IN 2 WEEKS  Your physician recommends that you schedule a follow-up appointment in: 4 months

## 2017-12-25 ENCOUNTER — Ambulatory Visit (HOSPITAL_COMMUNITY): Payer: Medicaid Other | Attending: Cardiovascular Disease

## 2017-12-25 ENCOUNTER — Other Ambulatory Visit: Payer: Self-pay

## 2017-12-25 DIAGNOSIS — F419 Anxiety disorder, unspecified: Secondary | ICD-10-CM | POA: Insufficient documentation

## 2017-12-25 DIAGNOSIS — I5022 Chronic systolic (congestive) heart failure: Secondary | ICD-10-CM | POA: Diagnosis not present

## 2017-12-25 DIAGNOSIS — E119 Type 2 diabetes mellitus without complications: Secondary | ICD-10-CM | POA: Diagnosis not present

## 2017-12-25 DIAGNOSIS — D649 Anemia, unspecified: Secondary | ICD-10-CM | POA: Diagnosis not present

## 2017-12-25 DIAGNOSIS — I110 Hypertensive heart disease with heart failure: Secondary | ICD-10-CM | POA: Insufficient documentation

## 2017-12-27 ENCOUNTER — Telehealth (HOSPITAL_COMMUNITY): Payer: Self-pay | Admitting: *Deleted

## 2017-12-27 NOTE — Telephone Encounter (Signed)
Result Notes for ECHOCARDIOGRAM COMPLETE   Notes recorded by Darron Doom, RN on 12/27/2017 at 1:12 PM EST Patient called and she is aware of results, no further questions. ------  Notes recorded by Jolaine Artist, MD on 12/26/2017 at 11:36 PM EST EF improving

## 2017-12-28 ENCOUNTER — Other Ambulatory Visit (HOSPITAL_COMMUNITY): Payer: Self-pay | Admitting: Internal Medicine

## 2017-12-28 DIAGNOSIS — I5022 Chronic systolic (congestive) heart failure: Secondary | ICD-10-CM

## 2017-12-31 ENCOUNTER — Other Ambulatory Visit (HOSPITAL_COMMUNITY): Payer: Self-pay | Admitting: *Deleted

## 2017-12-31 DIAGNOSIS — I5022 Chronic systolic (congestive) heart failure: Secondary | ICD-10-CM

## 2017-12-31 MED ORDER — SPIRONOLACTONE 25 MG PO TABS: ORAL_TABLET | ORAL | 3 refills | Status: DC

## 2018-01-25 ENCOUNTER — Telehealth (HOSPITAL_COMMUNITY): Payer: Self-pay | Admitting: *Deleted

## 2018-01-25 NOTE — Telephone Encounter (Signed)
Advanced Heart Failure Triage Encounter  Patient Name: Sara [name]  Date of Call: 01/25/18  Problem:  Patient called stating she has been having intermittent chest pressure for the past 3 days.  She went to her urgent care today to have it check and they didn't believe it was HF related as she wasn't having any swelling, wt gain, or shortness of breath.  Feels it may be stress/anxiety related.  They advised her to take an xanax and rest today  Plan:  Patient wanted our office to know, she also doesn't feel it is cardiac related.  She is aware that if symptoms become worse she needs to go to the ER.   Darron Doom, RN

## 2018-01-29 ENCOUNTER — Other Ambulatory Visit (HOSPITAL_COMMUNITY): Payer: Self-pay | Admitting: *Deleted

## 2018-01-29 MED ORDER — SACUBITRIL-VALSARTAN 97-103 MG PO TABS: 1.0000 | ORAL_TABLET | Freq: Two times a day (BID) | ORAL | 3 refills | Status: DC

## 2018-02-25 ENCOUNTER — Encounter (HOSPITAL_COMMUNITY): Payer: Self-pay | Admitting: Emergency Medicine

## 2018-02-25 ENCOUNTER — Other Ambulatory Visit: Payer: Self-pay

## 2018-02-25 ENCOUNTER — Emergency Department (HOSPITAL_COMMUNITY): Payer: Medicaid Other

## 2018-02-25 ENCOUNTER — Emergency Department (HOSPITAL_COMMUNITY)
Admission: EM | Admit: 2018-02-25 | Discharge: 2018-02-26 | Disposition: A | Discharge status: Home / Self Care | Payer: Medicaid Other | Attending: Emergency Medicine | Admitting: Emergency Medicine

## 2018-02-25 DIAGNOSIS — E119 Type 2 diabetes mellitus without complications: Secondary | ICD-10-CM | POA: Insufficient documentation

## 2018-02-25 DIAGNOSIS — R42 Dizziness and giddiness: Secondary | ICD-10-CM | POA: Insufficient documentation

## 2018-02-25 DIAGNOSIS — I110 Hypertensive heart disease with heart failure: Secondary | ICD-10-CM | POA: Diagnosis not present

## 2018-02-25 DIAGNOSIS — I5022 Chronic systolic (congestive) heart failure: Secondary | ICD-10-CM | POA: Diagnosis not present

## 2018-02-25 DIAGNOSIS — Z79899 Other long term (current) drug therapy: Secondary | ICD-10-CM | POA: Diagnosis not present

## 2018-02-25 DIAGNOSIS — Z794 Long term (current) use of insulin: Secondary | ICD-10-CM | POA: Insufficient documentation

## 2018-02-25 DIAGNOSIS — Z91040 Latex allergy status: Secondary | ICD-10-CM | POA: Insufficient documentation

## 2018-02-25 NOTE — ED Triage Notes (Signed)
Pt to ED with c/o dizziness x's  Days also c/o migraine headache.  Pt also c/o central chest pain onset 1 hour ago.  Pt st's she has hx of anxiety

## 2018-02-26 ENCOUNTER — Emergency Department (HOSPITAL_COMMUNITY): Payer: Medicaid Other

## 2018-02-26 LAB — BASIC METABOLIC PANEL
Anion gap: 9 (ref 5–15)
BUN: 11 mg/dL (ref 6–20)
CALCIUM: 9.6 mg/dL (ref 8.9–10.3)
CO2: 24 mmol/L (ref 22–32)
CREATININE: 0.82 mg/dL (ref 0.44–1.00)
Chloride: 101 mmol/L (ref 101–111)
GFR calc Af Amer: >60 mL/min (ref >60)
Glucose, Bld: 287 mg/dL — ABNORMAL HIGH (ref 65–99)
Potassium: 4.4 mmol/L (ref 3.5–5.1)
Sodium: 134 mmol/L — ABNORMAL LOW (ref 135–145)

## 2018-02-26 LAB — CBC
HCT: 38.1 % (ref 36.0–46.0)
Hemoglobin: 12.4 g/dL (ref 12.0–15.0)
MCH: 27.2 pg (ref 26.0–34.0)
MCHC: 32.5 g/dL (ref 30.0–36.0)
MCV: 83.6 fL (ref 78.0–100.0)
Platelets: 349 10*3/uL (ref 150–400)
RBC: 4.56 MIL/uL (ref 3.87–5.11)
RDW: 12.9 % (ref 11.5–15.5)
WBC: 15.2 10*3/uL — ABNORMAL HIGH (ref 4.0–10.5)

## 2018-02-26 LAB — I-STAT TROPONIN, ED: TROPONIN I, POC: 0 ng/mL (ref 0.00–0.08)

## 2018-02-26 LAB — I-STAT BETA HCG BLOOD, ED (MC, WL, AP ONLY): I-stat hCG, quantitative: <5 m[IU]/mL (ref <5)

## 2018-02-26 MED ORDER — MECLIZINE HCL 25 MG PO TABS
25.0000 mg | ORAL_TABLET | Freq: Once | ORAL | Status: AC
Start: 2018-02-26 — End: 2018-02-26
  Administered 2018-02-26: 25 mg via ORAL
  Filled 2018-02-26: qty 1

## 2018-02-26 MED ORDER — MECLIZINE HCL 25 MG PO TABS: 25.0000 mg | ORAL_TABLET | Freq: Two times a day (BID) | ORAL | 0 refills | Status: DC

## 2018-02-26 NOTE — ED Notes (Signed)
ED Provider at bedside. 

## 2018-02-26 NOTE — ED Provider Notes (Signed)
0630 Patient taken in sign out from [place]. Sent here from urgent care for 3 days of vertiginous symptoms and disequilibrium toward the left.  MRI pending.   0828 BP 120/70   Pulse 76   Temp 98.6 F (37 C) (Oral)   Resp 20   Ht 5\' 2"  (1.575 m)   Wt 86.6 kg (191 lb)   LMP 01/24/2018 (Exact Date)   SpO2 93%   BMI 34.93 kg/m  Patient MRI shows questionable finding unable to be characterized without contrast study.  I have discussed the case with Dr. Leonel Ramsay of the neurology service.  He states that the finding is not related to the symptoms she is having today but she will need an outpatient study.  On reevaluation the patient has only 2-3 saccade right-sided nystagmus.  She is ambulatory and can turn without falling but does feel she needs to correct herself or movement.  Patient will be discharged home with meclizine.  I have ordered outpatient neurology follow-up.    Margarita Mail, PA-C 02/26/18 0830    Ripley Fraise, MD 02/28/18 2308

## 2018-02-26 NOTE — ED Notes (Signed)
Pt back in room from MRI.

## 2018-02-26 NOTE — ED Notes (Signed)
Patient transported to MRI 

## 2018-02-26 NOTE — Discharge Instructions (Addendum)
Get help right away if: You have difficulty moving or speaking. You are always dizzy. You faint. You develop severe headaches. You have weakness in your hands, arms, or legs. You have changes in your hearing or vision. You develop a stiff neck. You develop sensitivity to light.

## 2018-02-26 NOTE — ED Provider Notes (Signed)
[facility] EMERGENCY DEPARTMENT Provider Note   CSN: [id number] Arrival date & time: 02/25/18  1734     History   Chief Complaint Chief Complaint  Patient presents with  . Dizziness  . Chest Pain    HPI Sara [name] is a 32 y.o. female.  Patient with past medical history remarkable for hypertension, diabetes, and CHF presents to the emergency department with chief complaint of dizziness.  She reports that the symptoms started 3 days ago.  She describes the symptoms as though the room were spinning.  It is worsened when she lies down and when she stands up.  She does not notice a difference with head movement side to side.  She denies any numbness, weakness, or tingling.  She states that it feels like she is being pulled to the left side when she is ambulating.  She denies any recent illness, denies any ear pain.  Denies any vision changes or speech changes.  She states that she did have some chest pain today, which she attributes to her anxiety.  She states that it is typical for her to have some chest pain when she feels anxious.  She was seen at an outside urgent care and was sent to the emergency department for further evaluation.  The history is provided by the patient. No language interpreter was used.    Past Medical History:  Diagnosis Date  . Anemia   . Anxiety    diagnosed in 2007  . CHF (congestive heart failure) ([place])   . Diabetes mellitus    type 2 - uncontrolled  . Enlarged thyroid   . Hypertension    benign essential  . Infected pilonidal cyst   . Migraine   . Non-reassuring fetal heart rate or rhythm affecting mother   . Pneumonia    history of     Patient Active Problem List   Diagnosis Date Noted  . Palpitations 09/25/2017  . Chronic systolic CHF (congestive heart failure), NYHA class 3 ([place]) 02/09/2016  . HTN, goal below 130/80 02/09/2016  . Essential hypertension   . Breastfeeding (infant) 01/27/2016  . Abnormal EKG  01/27/2016  . Preterm spontaneous labor with preterm delivery 04/02/2015  . Normal vaginal delivery 04/01/2015  . Preterm premature rupture of membranes (PPROM) with onset of labor within 24 hours of rupture in first trimester, antepartum 03/31/2015  . Pre-eclampsia in third trimester 03/16/2015  . Pre-existing type 2 diabetes mellitus in pregnancy in third trimester   . Preterm contractions 03/13/2015  . Diabetes mellitus with insulin therapy ([place]) 03/12/2015  . BMI 36.0-36.9,adult 03/12/2015  . Herpes simplex type 2 (HSV-2) infection affecting pregnancy, antepartum 03/12/2015  . Hemorrhoids 03/12/2015  . Latex allergy - rash 03/12/2015  . 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 History of migraine headaches - with aura 12/12/2014  . History of preterm delivery, currently pregnant 12/05/2014  . Generalized anxiety disorder 12/05/2014  . Anemia 12/05/2014  . Pilonidal abscess 09/30/2012    Past Surgical History:  Procedure Laterality Date  . LAPAROSCOPIC TUBAL LIGATION Bilateral 07/03/2017   Procedure: LAPAROSCOPIC BILATERAL TUBAL LIGATION;  Surgeon: Alden Hipp, MD;  Location: [facility];  Service: Gynecology;  Laterality: Bilateral;  . NO PAST SURGERIES       OB History    Gravida  3   Para  2   Term      Preterm  2   AB  1   Living  1     SAB  1   TAB      Ectopic      Multiple  0   Live Births  1            Home Medications    Prior to Admission medications   Medication Sig Start Date End Date Taking? Authorizing Provider  ALPRAZolam Duanne Moron) 0.5 MG tablet Take 0.5 mg by mouth 2 (two) times daily as needed for anxiety.   Yes [provider]  amLODipine (NORVASC) 5 MG tablet Take 1 tablet (5 mg total) by mouth daily. Patient taking differently: Take 5 mg by mouth at bedtime.  12/05/17  Yes Bensimhon, Shaune Pascal, MD  butalbital-acetaminophen-caffeine (FIORICET, ESGIC) [phone] MG  tablet Take 1 tablet by mouth every 4 (four) hours as needed for headache. 02/02/16  Yes Theodis Blaze, MD  carvedilol (COREG) 6.25 MG tablet TAKE 1 TABLET BY MOUTH TWICE DAILY WITH A MEAL. 08/30/17  Yes Bensimhon, Shaune Pascal, MD  furosemide (LASIX) 20 MG tablet Take 1 tablet (20 mg total) by mouth as needed (for weight gain). Patient taking differently: Take 20 mg by mouth daily as needed (for weight gain).  06/12/17 02/25/18 Yes Theora Gianotti, NP  insulin aspart (NOVOLOG) 100 UNIT/ML injection Inject 0-30 Units into the skin See admin instructions. Dose per sliding scale. Before lunch and with supper.   Yes [provider]  insulin glargine (LANTUS) 100 UNIT/ML injection Inject 36 Units into the skin at bedtime.    Yes [provider]  ivabradine (CORLANOR) 7.5 MG TABS tablet Take 1 tablet (7.5 mg total) by mouth 2 (two) times daily with a meal. 05/21/17  Yes Bensimhon, Shaune Pascal, MD  levocetirizine (XYZAL) 5 MG tablet Take 5 mg by mouth every evening.   Yes [provider]  Multiple Vitamin (MULTIVITAMIN WITH MINERALS) TABS tablet Take 1 tablet by mouth at bedtime.   Yes [provider]  sacubitril-valsartan (ENTRESTO) 97-103 MG Take 1 tablet by mouth 2 (two) times daily. 01/29/18  Yes Bensimhon, Shaune Pascal, MD  spironolactone (ALDACTONE) 25 MG tablet TAKE 1 TABLET(25 MG) BY MOUTH DAILY Patient taking differently: Take 25 mg by mouth at bedtime. TAKE 1 TABLET(25 MG) BY MOUTH DAILY 12/31/17  Yes Bensimhon, Shaune Pascal, MD  oxyCODONE-acetaminophen (PERCOCET/ROXICET) 5-325 MG tablet Take 2 tablets by mouth every 4 (four) hours as needed for severe pain. Patient not taking: Reported on 02/25/2018 07/03/17   Alden Hipp, MD    Family History Family History  Problem Relation Age of Onset  . Diabetes Mother   . Migraines Mother   . Hypertension Father   . Diabetes Maternal Grandmother   . Diabetes Paternal Grandmother     Social History Social History    Tobacco Use  . Smoking status: Never Smoker  . Smokeless tobacco: Never Used  Substance Use Topics  . Alcohol use: Yes    Alcohol/week: 0.0 oz    Comment: occ  . Drug use: No     Allergies   Septra ds [sulfamethoxazole-trimethoprim]; Bidil [isosorb dinitrate-hydralazine]; Lactose intolerance (gi); Lobster [shellfish allergy]; Pineapple; Latex; Tape; and Terconazole   Review of Systems Review of Systems  All other systems reviewed and are negative.    Physical Exam Updated Vital Signs BP 127/78   Pulse 73   Temp 98.6 F (37 C) (Oral)   Resp 16   Ht 5\' 2"  (1.575 m)   Wt 86.6 kg (191 lb)  LMP 01/24/2018 (Exact Date)   SpO2 100%   BMI 34.93 kg/m   Physical Exam  Constitutional: She is oriented to person, place, and time. She appears well-developed and well-nourished.  HENT:  Head: Normocephalic and atraumatic.  Eyes: Pupils are equal, round, and reactive to light. Conjunctivae and EOM are normal.  Horizontal nystagmus  Neck: Normal range of motion. Neck supple.  Cardiovascular: Normal rate and regular rhythm. Exam reveals no gallop and no friction rub.  No murmur heard. Pulmonary/Chest: Effort normal and breath sounds normal. No respiratory distress. She has no wheezes. She has no rales. She exhibits no tenderness.  Abdominal: Soft. Bowel sounds are normal. She exhibits no distension and no mass. There is no tenderness. There is no rebound and no guarding.  Musculoskeletal: Normal range of motion. She exhibits no edema or tenderness.  Neurological: She is alert and oriented to person, place, and time.  CN III-XII intact, speech is clear, movements are goal oriented, no ataxia Ambulates with a slightly hesitant gait  Skin: Skin is warm and dry.  Psychiatric: She has a normal mood and affect. Her behavior is normal. Judgment and thought content normal.  Nursing note and vitals reviewed.    ED Treatments / Results  Labs (all labs ordered are listed, but only  abnormal results are displayed) Labs Reviewed  CBC - Abnormal; Notable for the following components:      Result Value   WBC 15.2 (*)    All other components within normal limits  BASIC METABOLIC PANEL  I-STAT TROPONIN, ED  I-STAT BETA HCG BLOOD, ED ([facility], [facility], [facility] ONLY)    EKG EKG Interpretation  Date/Time:  Monday February 25 2018 18:57:31 EDT Ventricular Rate:  79 PR Interval:  154 QRS Duration: 90 QT Interval:  386 QTC Calculation: 442 R Axis:   90 Text Interpretation:  Normal sinus rhythm Rightward axis Borderline ECG Confirmed by Ripley Fraise [phone]) on 02/26/2018 12:40:11 AM   Radiology Dg Chest 2 View  Result Date: 02/25/2018 CLINICAL DATA:  Chest pain, dizziness EXAM: CHEST - 2 VIEW COMPARISON:  09/24/2017 FINDINGS: Lungs are clear.  No pleural effusion or pneumothorax. The heart is normal in size. Visualized osseous structures are within normal limits. IMPRESSION: Normal chest radiographs. Electronically Signed   By: Julian Hy M.D.   On: 02/25/2018 19:15   Mr Brain Wo Contrast  Result Date: 02/26/2018 CLINICAL DATA:  32 year old female with dizziness and headache for several days. Vertigo. EXAM: MRI HEAD WITHOUT CONTRAST TECHNIQUE: Multiplanar, multiecho pulse sequences of the brain and surrounding structures were obtained without intravenous contrast. COMPARISON:  Neck CT with contrast 1 7,012 FINDINGS: Brain: Normal cerebral volume. No restricted diffusion to suggest acute infarction. No midline shift, mass effect, evidence of mass lesion, ventriculomegaly, extra-axial collection or acute intracranial hemorrhage. Cervicomedullary junction and pituitary are within normal limits. In the posterior inferior left frontal lobe bordering on the anterior parietal lobe there is an indistinct 18 millimeter area of susceptibility (series 12001 image 36) which is not correlated with signal abnormality on the remaining sequences (including B0). No regional mass effect. There does  appear to be a nearby prominent venous vessel (series 12001, image 33). Otherwise signal is normal throughout the brain. Vascular: Major intracranial vascular flow voids are preserved and appear normal. Skull and upper cervical spine: Normal visible cervical spine. Normal bone marrow signal. Sinuses/Orbits: Normal orbits soft tissues. The paranasal sinuses are clear. Other: Bilateral mastoid air cells are clear. Visible internal auditory structures appear normal. Scalp  and face soft tissues appear negative. IMPRESSION: 1. Unusual 18 mm area of susceptibility signal in the left hemisphere (series 12001 image 36) which is not correlated with abnormality on any other sequence. No associated edema or mass effect. Significance and etiology are unclear. An image artifact of this size is difficult to explain. Perhaps this is related to a developmental venous anomaly (normal variant). Recommend follow-up postcontrast Brain MRI imaging to further evaluate (postcontrast T1 imaging in all 3 imaging planes). 2. Otherwise normal MRI appearance of the brain. Electronically Signed   By: Genevie Ann M.D.   On: 02/26/2018 07:39    Procedures Procedures (including critical care time)  Medications Ordered in ED Medications  meclizine (ANTIVERT) tablet 25 mg (25 mg Oral Given 02/26/18 0107)     Initial Impression / Assessment and Plan / ED Course  I have reviewed the triage vital signs and the nursing notes.  Pertinent labs & imaging results that were available during my care of the patient were reviewed by me and considered in my medical decision making (see chart for details).     Patient sent to emergency department for dizziness.  She has had this for the past 3 days.  Dizziness is described as room spinning sensation.  Denies any recent illness.  The symptoms wax and wane in severity, but have generally been present constantly since the onset.  Will try meclizine.  Will reassess.  No neurologic deficit noted on my  exam.  1:44 AM Reports no improvement with meclizine.  Patient seen by and discussed with Dr. Christy Gentles, who agrees that symptoms are likely vertigo.  Recommends shared decision making regarding MRI and utility of MRI.  Patient would like to proceed with MRI.  Patient signed out to oncoming provider, Kenton Kingfisher, PA-C, who will follow-up on MRI.  MRI pending.  If negative, DC home with meclizine and PCP follow-up.  Final Clinical Impressions(s) / ED Diagnoses   Final diagnoses:  Vertigo    ED Discharge Orders    None       Delaine Lame 02/26/18 2159    Ripley Fraise, MD 02/28/18 2307

## 2018-02-26 NOTE — ED Notes (Signed)
Pt up and ambulatory at this time with steady gait

## 2018-03-05 ENCOUNTER — Ambulatory Visit: Payer: BLUE CROSS/BLUE SHIELD | Admitting: Neurology

## 2018-03-05 ENCOUNTER — Encounter: Payer: Self-pay | Admitting: Neurology

## 2018-03-05 ENCOUNTER — Other Ambulatory Visit: Payer: Self-pay

## 2018-03-05 ENCOUNTER — Telehealth: Payer: Self-pay | Admitting: Neurology

## 2018-03-05 VITALS — BP 130/81 | HR 75 | Resp 16 | Ht 62.0 in | Wt 190.5 lb

## 2018-03-05 DIAGNOSIS — Z794 Long term (current) use of insulin: Secondary | ICD-10-CM | POA: Diagnosis not present

## 2018-03-05 DIAGNOSIS — H81399 Other peripheral vertigo, unspecified ear: Secondary | ICD-10-CM

## 2018-03-05 DIAGNOSIS — E119 Type 2 diabetes mellitus without complications: Secondary | ICD-10-CM | POA: Diagnosis not present

## 2018-03-05 DIAGNOSIS — R42 Dizziness and giddiness: Secondary | ICD-10-CM

## 2018-03-05 DIAGNOSIS — R9089 Other abnormal findings on diagnostic imaging of central nervous system: Secondary | ICD-10-CM | POA: Diagnosis not present

## 2018-03-05 NOTE — Progress Notes (Signed)
[facility]  PATIENT: Sara [name] DOB: Jan 09, 1986  REFERRING DOCTOR OR PCP:  Delrae Rend, MD SOURCE:   Patient, notes from emergency room, imaging and lab reports, MRI images personally reviewed on PACS.  _________________________________   HISTORICAL  CHIEF COMPLAINT:  Chief Complaint  Patient presents with  . Abnormal MRI Brain    Sara [name] is here with her boyfriend Javontti to discuss abnormal MRI Brain.  Seen in the ER on 02/25/18 for 3 day hx.of Vertigo, where MRI Brain showed an abnormality that was not felt to be the cause of her vertigo.  She was referred to Dr. Felecia Shelling for further investigation.  She sts. vertigo has completely resolved/fim    HISTORY OF PRESENT ILLNESS:  I had the pleasure of seeing your patient, Sara [name], at [facility] for neurologic consultation regarding her abnormal MRI and recent history of vertigo.  She is a 32 year old woman who presented to the emergency room on 02/25/2018 after experiencing 3 days of vertigo.    She felt like she was being pushed to the left.    She needed to She had no diplopia or therbrace herself after standing and gait was slightly off.     THere was no weakness or numbness or limb ataxia.      She had no diplopia or other visual problems.   The onset was random.    She felt tired the initial day.  Symptoms started after she got home from a nail shop and went onto her computer.   She started to note vertigo.   This continued when she laid down and worsened over the next 48 hours, prompting a visit to the ED.    Vertigo was constant but seemed worse with changes in position.      Labs were fine except for elevated glucose.   She has insulin dependent Type 2 DM, since age 31-23.     The vertigo improved a few days later and completely resolved 2 days ago.     She has no history of head trauma.    She was a premature baby about 32 weeks and she was 3 pounds 8 oz and she spent a few  weeks in the NICU.    She was developmentally normal except that she was slow at walking, not walking until 18 months.     While in the emergency room, she had an MRI brain showing a focus of increased susceptibility in the left frontoparietal region of unknown significance.  Of note, the brainstem of the internal auditory canals appear normal.  I personally reviewed the MRI of the brain dated 02/26/2018.  It shows  18 focus of increased susceptibility in the left frontoparietal region.  It does not correlate to any signal abnormality on any of the other sequences.  No flow voids are noted.    The study was done without contrast.   Laboratory tests were also reviewed and remarkable for elevated glucose.  REVIEW OF SYSTEMS: Constitutional: No fevers, chills, sweats, or change in appetite Eyes: No visual changes, double vision, eye pain Ear, nose and throat: No hearing loss, ear pain, nasal congestion, sore throat Cardiovascular: No chest pain, palpitations Respiratory: No shortness of breath at rest or with exertion.   No wheezes GastrointestinaI: No nausea, vomiting, diarrhea, abdominal pain, fecal incontinence Genitourinary: No dysuria, urinary retention or frequency.  No nocturia. Musculoskeletal: No neck pain, back pain Integumentary: No rash, pruritus, skin lesions Neurological: as above Psychiatric: No depression  at this time.  No anxiety Endocrine: No palpitations, diaphoresis, change in appetite, change in weigh or increased thirst Hematologic/Lymphatic: No anemia, purpura, petechiae. Allergic/Immunologic: No itchy/runny eyes, nasal congestion, recent allergic reactions, rashes  ALLERGIES: Allergies  Allergen Reactions  . Septra Ds [Sulfamethoxazole-Trimethoprim] Hives and Itching  . Bidil [Isosorb Dinitrate-Hydralazine] Other (See Comments)    Migraines   . Lactose Intolerance (Gi) Diarrhea  . Lobster [Shellfish Allergy] Itching    Throat itches, but no shortness of breath    . Pineapple Itching    Throat itches  . Latex Rash  . Tape Rash  . Terconazole Rash    HOME MEDICATIONS:  Current Outpatient Medications:  .  ALPRAZolam (XANAX) 0.5 MG tablet, Take 0.5 mg by mouth 2 (two) times daily as needed for anxiety., Disp: , Rfl:  .  amLODipine (NORVASC) 5 MG tablet, Take 1 tablet (5 mg total) by mouth daily. (Patient taking differently: Take 5 mg by mouth at bedtime. ), Disp: 30 tablet, Rfl: 6 .  butalbital-acetaminophen-caffeine (FIORICET, ESGIC) 50-325-40 MG tablet, Take 1 tablet by mouth every 4 (four) hours as needed for headache., Disp: 14 tablet, Rfl: 0 .  carvedilol (COREG) 6.25 MG tablet, TAKE 1 TABLET BY MOUTH TWICE DAILY WITH A MEAL., Disp: 60 tablet, Rfl: 3 .  insulin aspart (NOVOLOG) 100 UNIT/ML injection, Inject 0-30 Units into the skin See admin instructions. Dose per sliding scale. Before lunch and with supper., Disp: , Rfl:  .  insulin glargine (LANTUS) 100 UNIT/ML injection, Inject 36 Units into the skin at bedtime. , Disp: , Rfl:  .  ivabradine (CORLANOR) 7.5 MG TABS tablet, Take 1 tablet (7.5 mg total) by mouth 2 (two) times daily with a meal., Disp: 60 tablet, Rfl: 3 .  levocetirizine (XYZAL) 5 MG tablet, Take 5 mg by mouth every evening., Disp: , Rfl:  .  meclizine (ANTIVERT) 25 MG tablet, Take 1 tablet (25 mg total) by mouth 2 (two) times daily., Disp: 15 tablet, Rfl: 0 .  Multiple Vitamin (MULTIVITAMIN WITH MINERALS) TABS tablet, Take 1 tablet by mouth at bedtime., Disp: , Rfl:  .  oxyCODONE-acetaminophen (PERCOCET/ROXICET) 5-325 MG tablet, Take 2 tablets by mouth every 4 (four) hours as needed for severe pain., Disp: 15 tablet, Rfl: 0 .  sacubitril-valsartan (ENTRESTO) 97-103 MG, Take 1 tablet by mouth 2 (two) times daily., Disp: 180 tablet, Rfl: 3 .  spironolactone (ALDACTONE) 25 MG tablet, TAKE 1 TABLET(25 MG) BY MOUTH DAILY (Patient taking differently: Take 25 mg by mouth at bedtime. TAKE 1 TABLET(25 MG) BY MOUTH DAILY), Disp: 30 tablet,  Rfl: 3 .  furosemide (LASIX) 20 MG tablet, Take 1 tablet (20 mg total) by mouth as needed (for weight gain). (Patient taking differently: Take 20 mg by mouth daily as needed (for weight gain). ), Disp: 30 tablet, Rfl: 3  PAST MEDICAL HISTORY: Past Medical History:  Diagnosis Date  . Anemia   . Anxiety    diagnosed in 2007  . CHF (congestive heart failure) ([place])   . Diabetes mellitus    type 2 - uncontrolled  . Enlarged thyroid   . Hypertension    benign essential  . Infected pilonidal cyst   . Migraine   . Non-reassuring fetal heart rate or rhythm affecting mother   . Pneumonia    history of   . Vision abnormalities     PAST SURGICAL HISTORY: Past Surgical History:  Procedure Laterality Date  . LAPAROSCOPIC TUBAL LIGATION Bilateral 07/03/2017   Procedure: LAPAROSCOPIC BILATERAL TUBAL  LIGATION;  Surgeon: Alden Hipp, MD;  Location: [facility];  Service: Gynecology;  Laterality: Bilateral;  . NO PAST SURGERIES      FAMILY HISTORY: Family History  Problem Relation Age of Onset  . Diabetes Mother   . Migraines Mother   . Hypertension Father   . Diabetes Maternal Grandmother   . Diabetes Paternal Grandmother   . Healthy Brother   . Healthy Brother     SOCIAL HISTORY:  Social History   Socioeconomic History  . Marital status: Divorced    Spouse name: Not on file  . Number of children: 1  . Years of education: 87  . Highest education level: Not on file  Occupational History  . Not on file  Social Needs  . Financial resource strain: Not on file  . Food insecurity:    Worry: Not on file    Inability: Not on file  . Transportation needs:    Medical: Not on file    Non-medical: Not on file  Tobacco Use  . Smoking status: Never Smoker  . Smokeless tobacco: Never Used  Substance and Sexual Activity  . Alcohol use: Yes    Alcohol/week: 0.0 oz    Comment: occ  . Drug use: No  . Sexual activity: Yes    Birth control/protection: Implant  Lifestyle  . Physical  activity:    Days per week: Not on file    Minutes per session: Not on file  . Stress: Not on file  Relationships  . Social connections:    Talks on phone: Not on file    Gets together: Not on file    Attends religious service: Not on file    Active member of club or organization: Not on file    Attends meetings of clubs or organizations: Not on file    Relationship status: Not on file  . Intimate partner violence:    Fear of current or ex partner: Not on file    Emotionally abused: Not on file    Physically abused: Not on file    Forced sexual activity: Not on file  Other Topics Concern  . Not on file  Social History Narrative   Lives with boyfriend and child   Is pregnant (20weeks) with second child   1st born is a boy and her second is a boy   Drinks one cup of coffee a day     PHYSICAL EXAM  Vitals:   03/05/18 1018  BP: 130/81  Pulse: 75  Resp: 16  Weight: 190 lb 8 oz (86.4 kg)  Height: 5\' 2"  (1.575 m)    Body mass index is 34.84 kg/m.   General: The patient is well-developed and well-nourished and in no acute distress  Eyes:  Funduscopic exam shows normal optic discs and retinal vessels.  Neck: The neck is supple, no carotid bruits are noted.  The neck is nontender.  Cardiovascular: The heart has a regular rate and rhythm with a normal S1 and S2. There were no murmurs, gallops or rubs. Lungs are clear to auscultation.  Skin: Extremities are without significant edema.  Musculoskeletal:  Back is nontender  Neurologic Exam  Mental status: The patient is alert and oriented x 3 at the time of the examination. The patient has apparent normal recent and remote memory, with an apparently normal attention span and concentration ability.   Speech is normal.  Cranial nerves: Extraocular movements are full. Pupils are equal, round, and reactive to light and accomodation.  Visual fields are full.  Facial symmetry is present. There is good facial sensation to soft  touch bilaterally.Facial strength is normal.  Trapezius and sternocleidomastoid strength is normal. No dysarthria is noted.  The tongue is midline, and the patient has symmetric elevation of the soft palate. No obvious hearing deficits are noted.  Motor:  Muscle bulk is normal.   Tone is normal. Strength is  5 / 5 in all 4 extremities.   Sensory: Sensory testing is intact to pinprick, soft touch and vibration sensation in the arms.  She had normal sensation to touch and temperature in the legs but mildly reduced vibration at the toes..  Coordination: Cerebellar testing reveals good finger-nose-finger and heel-to-shin bilaterally.  Gait and station: Station is normal.   Gait is normal. Tandem gait is normal. Romberg is negative.   Reflexes: Deep tendon reflexes are symmetric and normal bilaterally.   Plantar responses are flexor.    DIAGNOSTIC DATA (LABS, IMAGING, TESTING) - I reviewed patient records, labs, notes, testing and imaging myself where available.  Lab Results  Component Value Date   WBC 15.2 (H) 02/26/2018   HGB 12.4 02/26/2018   HCT 38.1 02/26/2018   MCV 83.6 02/26/2018   PLT 349 02/26/2018      Component Value Date/Time   NA 134 (L) 02/26/2018 0108   K 4.4 02/26/2018 0108   CL 101 02/26/2018 0108   CO2 24 02/26/2018 0108   GLUCOSE 287 (H) 02/26/2018 0108   BUN 11 02/26/2018 0108   CREATININE 0.82 02/26/2018 0108   CALCIUM 9.6 02/26/2018 0108   PROT 6.3 (L) 04/01/2015 0815   ALBUMIN 3.0 (L) 04/01/2015 0815   AST 13 (L) 04/01/2015 0815   ALT 12 (L) 04/01/2015 0815   ALKPHOS 76 04/01/2015 0815   BILITOT 0.4 04/01/2015 0815   GFRNONAA >60 02/26/2018 0108   GFRAA >60 02/26/2018 0108   Lab Results  Component Value Date   CHOL 141 01/30/2016   HDL 38 (L) 01/30/2016   LDLCALC 70 01/30/2016   TRIG 163 (H) 01/30/2016   CHOLHDL 3.7 01/30/2016   Lab Results  Component Value Date   HGBA1C 8.6 (H) 01/27/2016   No results found for: VITAMINB12 Lab Results    Component Value Date   TSH 1.147 01/30/2016       ASSESSMENT AND PLAN  Vertigo  Abnormal brain MRI  Other peripheral vertigo, unspecified ear - Plan: MR BRAIN W CONTRAST, CANCELED: MR BRAIN W CONTRAST  Diabetes mellitus with insulin therapy ([place])  Premature birth   In summary, Ms. Casebolt is a 32 year old woman who had translation of vertigo x1 week with a positional element that spontaneously resolved a couple days ago.  The etiology is uncertain.  Given the relatively short course of about 1 week, this probably represents a benign positional vertigo or a post viral vertigo.  The MRI did not show any abnormalities related to the vertigo.  The second issue is the abnormal brain MRI.  There is a focus of increased susceptibility in the left hemisphere most consistent with a small chronic hemorrhage.  This could be due to a developmental venous anomaly that may not have shown up on a noncontrasted MRI.  Most likely this is the sequela of her personal history of being a premature infant weighing only 3-1/2 pounds.  We do need to determine if this is due to a developmental venous anomaly and I have ordered an MRI of the brain with contrast only.  If this does not  show any abnormality, then the focus is most likely related to her premature birth.   Most likely, this incidental finding would not cause issues in the future.  She will return to see me as needed and is advised to call if she has any new or worsening neurologic symptoms.  Thank you for asking me to see Ms. Abdullah.  Please let me know if I can be of further assistance with her or other patients in the future.   Richard A. Felecia Shelling, MD, [facility] 01/03/676, 03:40 AM Certified in Neurology, Clinical Neurophysiology, Sleep Medicine, Pain Medicine and Neuroimaging  [facility] [address], [place], [place] [address] [phone]

## 2018-03-05 NOTE — Telephone Encounter (Signed)
Medicaid auth: NPR with [employer] ref # [id number] on 03/05/18.   Patient is schedule to have her MRI done at [facility] for Thurs. 03/07/18 arrival time is 3:30 pm patient is aware of time & day.

## 2018-03-07 ENCOUNTER — Ambulatory Visit (HOSPITAL_COMMUNITY)
Admission: RE | Admit: 2018-03-07 | Discharge: 2018-03-07 | Disposition: A | Discharge status: Home / Self Care | Payer: Medicaid Other | Source: Ambulatory Visit | Attending: Neurology | Admitting: Neurology

## 2018-03-07 ENCOUNTER — Encounter (HOSPITAL_COMMUNITY): Payer: Self-pay | Admitting: Radiology

## 2018-03-07 DIAGNOSIS — H81399 Other peripheral vertigo, unspecified ear: Secondary | ICD-10-CM | POA: Diagnosis not present

## 2018-03-07 MED ORDER — GADOBENATE DIMEGLUMINE 529 MG/ML IV SOLN
19.0000 mL | Freq: Once | INTRAVENOUS | Status: AC | PRN
  Administered 2018-03-07: 19 mL via INTRAVENOUS

## 2018-03-11 ENCOUNTER — Telehealth: Payer: Self-pay | Admitting: *Deleted

## 2018-03-11 NOTE — Telephone Encounter (Signed)
-----   Message from Britt Bottom, MD sent at 03/11/2018  1:39 PM EDT ----- Please let her know that the MRI with contrast showed that that abnormal area on the MRI was due to a developmental venous anomaly.  This just means that there is an area where the veins are larger than they normally are and these occur somewhere in the brain in about 1 out of 30 people.  No treatment or follow-up is necessary.

## 2018-03-11 NOTE — Telephone Encounter (Signed)
Attempted to contact pt. with results, but received message that mailbox is full/fim

## 2018-03-13 ENCOUNTER — Telehealth: Payer: Self-pay | Admitting: *Deleted

## 2018-03-13 NOTE — Telephone Encounter (Signed)
-----   Message from Britt Bottom, MD sent at 03/11/2018  1:39 PM EDT ----- Please let her know that the MRI with contrast showed that that abnormal area on the MRI was due to a developmental venous anomaly.  This just means that there is an area where the veins are larger than they normally are and these occur somewhere in the brain in about 1 out of 30 people.  No treatment or follow-up is necessary.

## 2018-03-13 NOTE — Telephone Encounter (Signed)
Spoke with Sara [name] and reviewed below MRI results.  She verbalized understanding of same/fim

## 2018-04-04 ENCOUNTER — Ambulatory Visit (HOSPITAL_COMMUNITY)
Admission: RE | Admit: 2018-04-04 | Discharge: 2018-04-04 | Disposition: A | Discharge status: Home / Self Care | Payer: BLUE CROSS/BLUE SHIELD | Source: Ambulatory Visit | Attending: Internal Medicine | Admitting: Internal Medicine

## 2018-04-04 ENCOUNTER — Other Ambulatory Visit: Payer: Self-pay

## 2018-04-04 ENCOUNTER — Encounter (HOSPITAL_COMMUNITY): Payer: Self-pay | Admitting: Internal Medicine

## 2018-04-04 VITALS — BP 137/77 | HR 87 | Wt 198.4 lb

## 2018-04-04 DIAGNOSIS — Z8249 Family history of ischemic heart disease and other diseases of the circulatory system: Secondary | ICD-10-CM | POA: Diagnosis not present

## 2018-04-04 DIAGNOSIS — F419 Anxiety disorder, unspecified: Secondary | ICD-10-CM | POA: Insufficient documentation

## 2018-04-04 DIAGNOSIS — Z79899 Other long term (current) drug therapy: Secondary | ICD-10-CM | POA: Diagnosis not present

## 2018-04-04 DIAGNOSIS — R5383 Other fatigue: Secondary | ICD-10-CM | POA: Insufficient documentation

## 2018-04-04 DIAGNOSIS — E119 Type 2 diabetes mellitus without complications: Secondary | ICD-10-CM | POA: Diagnosis not present

## 2018-04-04 DIAGNOSIS — Z833 Family history of diabetes mellitus: Secondary | ICD-10-CM | POA: Insufficient documentation

## 2018-04-04 DIAGNOSIS — I5022 Chronic systolic (congestive) heart failure: Secondary | ICD-10-CM

## 2018-04-04 DIAGNOSIS — I110 Hypertensive heart disease with heart failure: Secondary | ICD-10-CM | POA: Diagnosis present

## 2018-04-04 DIAGNOSIS — R002 Palpitations: Secondary | ICD-10-CM | POA: Insufficient documentation

## 2018-04-04 DIAGNOSIS — I10 Essential (primary) hypertension: Secondary | ICD-10-CM | POA: Diagnosis not present

## 2018-04-04 DIAGNOSIS — Z9851 Tubal ligation status: Secondary | ICD-10-CM | POA: Insufficient documentation

## 2018-04-04 DIAGNOSIS — Z794 Long term (current) use of insulin: Secondary | ICD-10-CM | POA: Diagnosis not present

## 2018-04-04 NOTE — Progress Notes (Signed)
Advanced Heart Failure Clinic Note   Patient ID: Sara [name], female   DOB: January 30, 1986, 32 y.o.   MRN: [id number] PCP: Primary Cardiologist: Dr Haroldine Laws  HPI: Sara [name] is a 32 y.o. 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 S/P Tubal ligation 06/2017.    Admitted  March 2017 with increased dsypnea and PND.  Prior  to admit she had a virus. CXR was concerning for CAP. Completed antibiotic course. ECHO performed and showed reduced EF -->35%. Diuresed with IV lasix. Instructed to stop breast feeding with addition of HF meds and to prevent pregnancy. Started on entresto, carvedilol and lasix. Discharge weight was 175 pounds.   Echo 2/19 EF 55-60%   She returns today for HF follow up. Very active. Using exercise bike about 3x/week. No CP or SOB. Can go up and down steps without problem. No edema, orthopnea or PND. Taking all meds without a problem.   cMRI 10/17 EF 37% RV normal ECHO 04/2016 ~35-40%  ECHO 12/2015 ~30-35%.  ECHO 04/2017 EF 30-35%.   CPX 7/17 FVC 2.68 (86%)    FEV1 2.43 (91%)       BP rest: 118/64 BP peak: 156/84 Peak VO2: 17.9 (63.9% predicted peak VO2) - corrects to 23.4 fo IBW VE/VCO2 slope: 31.2 OUES: 1.63 Peak RER: 1.17 Ventilatory Threshold: 13.9 (49.6% predicted or measured peak VO2) VE/MVV: 55.9% O2pulse: 10  (83% predicted O2pulse)  SH: Lives with her boyfriend and 2 children. She does not smoke or drink alcohol.   Review of systems complete and found to be negative unless listed in HPI.    SH:  Social History   Socioeconomic History  . Marital status: Divorced    Spouse name: Not on file  . Number of children: 1  . Years of education: 72  . Highest education level: Not on file  Occupational History  . Not on file  Social Needs  . Financial resource strain: Not on file  . Food insecurity:    Worry: Not on file    Inability: Not on file  .  Transportation needs:    Medical: Not on file    Non-medical: Not on file  Tobacco Use  . Smoking status: Never Smoker  . Smokeless tobacco: Never Used  Substance and Sexual Activity  . Alcohol use: Yes    Alcohol/week: 0.0 oz    Comment: occ  . Drug use: No  . Sexual activity: Yes    Birth control/protection: Implant  Lifestyle  . Physical activity:    Days per week: Not on file    Minutes per session: Not on file  . Stress: Not on file  Relationships  . Social connections:    Talks on phone: Not on file    Gets together: Not on file    Attends religious service: Not on file    Active member of club or organization: Not on file    Attends meetings of clubs or organizations: Not on file    Relationship status: Not on file  . Intimate partner violence:    Fear of current or ex partner: Not on file    Emotionally abused: Not on file    Physically abused: Not on file    Forced sexual activity: Not on file  Other Topics Concern  . Not on file  Social History Narrative   Lives with boyfriend and child   Is pregnant (20weeks) with second  child   1st born is a boy and her second is a boy   Drinks one cup of coffee a day    FH:  Family History  Problem Relation Age of Onset  . Diabetes Mother   . Migraines Mother   . Hypertension Father   . Diabetes Maternal Grandmother   . Diabetes Paternal Grandmother   . Healthy Brother   . Healthy Brother     Past Medical History:  Diagnosis Date  . Anemia   . Anxiety    diagnosed in 2007  . CHF (congestive heart failure) ([place])   . Diabetes mellitus    type 2 - uncontrolled  . Enlarged thyroid   . Hypertension    benign essential  . Infected pilonidal cyst   . Migraine   . Non-reassuring fetal heart rate or rhythm affecting mother   . Pneumonia    history of   . Vision abnormalities     Current Outpatient Medications  Medication Sig Dispense Refill  . ALPRAZolam (XANAX) 0.5 MG tablet Take 0.5 mg by mouth 2 (two)  times daily as needed for anxiety.    Marland Kitchen amLODipine (NORVASC) 5 MG tablet Take 1 tablet (5 mg total) by mouth daily. (Patient taking differently: Take 5 mg by mouth at bedtime. ) 30 tablet 6  . butalbital-acetaminophen-caffeine (FIORICET, ESGIC) 50-325-40 MG tablet Take 1 tablet by mouth every 4 (four) hours as needed for headache. 14 tablet 0  . carvedilol (COREG) 6.25 MG tablet TAKE 1 TABLET BY MOUTH TWICE DAILY WITH A MEAL. 60 tablet 3  . furosemide (LASIX) 20 MG tablet Take 1 tablet (20 mg total) by mouth as needed (for weight gain). (Patient taking differently: Take 20 mg by mouth daily as needed (for weight gain). ) 30 tablet 3  . insulin aspart (NOVOLOG) 100 UNIT/ML injection Inject 0-30 Units into the skin See admin instructions. Dose per sliding scale. Before lunch and with supper.    . insulin glargine (LANTUS) 100 UNIT/ML injection Inject 36 Units into the skin at bedtime.     . ivabradine (CORLANOR) 7.5 MG TABS tablet Take 1 tablet (7.5 mg total) by mouth 2 (two) times daily with a meal. 60 tablet 3  . levocetirizine (XYZAL) 5 MG tablet Take 5 mg by mouth every evening.    . Multiple Vitamin (MULTIVITAMIN WITH MINERALS) TABS tablet Take 1 tablet by mouth at bedtime.    . sacubitril-valsartan (ENTRESTO) 97-103 MG Take 1 tablet by mouth 2 (two) times daily. 180 tablet 3  . spironolactone (ALDACTONE) 25 MG tablet TAKE 1 TABLET(25 MG) BY MOUTH DAILY (Patient taking differently: Take 25 mg by mouth at bedtime. TAKE 1 TABLET(25 MG) BY MOUTH DAILY) 30 tablet 3   No current facility-administered medications for this encounter.    Vitals:   04/04/18 1505  BP: 137/77  Pulse: 87  SpO2: 100%  Weight: 198 lb 6.4 oz (90 kg)   Wt Readings from Last 3 Encounters:  04/04/18 198 lb 6.4 oz (90 kg)  03/05/18 190 lb 8 oz (86.4 kg)  02/25/18 191 lb (86.6 kg)    PHYSICAL EXAM: General:  Well appearing. No resp difficulty HEENT: normal Neck: supple. no JVD. Carotids 2+ bilat; no bruits. No  lymphadenopathy. Mild  thryomegaly appreciated. Cor: PMI nondisplaced. Regular rate & rhythm. No rubs, gallops or murmurs. Lungs: clear Abdomen: obese soft, nontender, nondistended. No hepatosplenomegaly. No bruits or masses. Good bowel sounds. Extremities: no cyanosis, clubbing, rash, edema Neuro: alert & orientedx3, cranial  nerves grossly intact. moves all 4 extremities w/o difficulty. Affect pleasant    ASSESSMENT & PLAN: 1. Chronic Systolic CHF- 2/54/27CWCB EF 35%. - ? Viral Cardiomyopathy versus HTN. Also pre-eclampsia with both pregnancies.  ECHO (7/17) EF ->  35%. cMRI 10/17 EF 37%. ECHO 04/2017 EF 30-35%.  - Echo 2/19 EF 55-60% (Personally reviewed in clinic with her) - NYHA I  symptoms - Volume status looks good - Continue lasix 20 mg as needed. She has a hard time knowing when to take extra. - Continue Entresto 97/103 mg BID. - Continue corlanor 7.5 mg twice a day. Can consider weaning down eventually.  - Continue spiro 25 mg daily. - She is intolerant of carvedilol more than 6.25 bid due to fatigue.  - Intolerant Bidil due to headache. Failed on 2 separate occasions.   2 HTN  -Blood pressure well controlled. Continue current regimen. 3 DM - Control improved. Per PCP. Consider Jardiance 4  OB - S/P tubal sterilization 06/2017.   5. Fatigue - No sleep apnea noted on sleep study. No change.  6. Palpitations - Holter monitor 08/2017 showed NSR with frequent PVCs (2%) with "fluttering" diary entries correlating with bigeminy - No further palpitations   Glori Bickers, MD   3:35 PM

## 2018-04-04 NOTE — Patient Instructions (Signed)
We will contact you in 6 months to schedule your next appointment and echocardiogram  

## 2018-04-18 ENCOUNTER — Other Ambulatory Visit (HOSPITAL_COMMUNITY): Payer: Self-pay | Admitting: Internal Medicine

## 2018-04-18 ENCOUNTER — Encounter (HOSPITAL_COMMUNITY): Payer: Self-pay

## 2018-04-18 NOTE — Progress Notes (Signed)
Entresto PA Approved Date:03/19/2018 Coverage End Date:04/18/2019

## 2018-04-22 ENCOUNTER — Other Ambulatory Visit: Payer: Self-pay | Admitting: Internal Medicine

## 2018-04-22 DIAGNOSIS — E049 Nontoxic goiter, unspecified: Secondary | ICD-10-CM

## 2018-04-30 ENCOUNTER — Other Ambulatory Visit (HOSPITAL_COMMUNITY): Payer: Self-pay | Admitting: Internal Medicine

## 2018-04-30 DIAGNOSIS — I5022 Chronic systolic (congestive) heart failure: Secondary | ICD-10-CM

## 2018-05-01 ENCOUNTER — Ambulatory Visit
Admission: RE | Admit: 2018-05-01 | Discharge: 2018-05-01 | Disposition: A | Discharge status: Home / Self Care | Payer: BLUE CROSS/BLUE SHIELD | Source: Ambulatory Visit | Attending: Internal Medicine | Admitting: Internal Medicine

## 2018-05-01 DIAGNOSIS — E049 Nontoxic goiter, unspecified: Secondary | ICD-10-CM

## 2018-05-21 ENCOUNTER — Other Ambulatory Visit (HOSPITAL_COMMUNITY): Payer: Self-pay | Admitting: Internal Medicine

## 2018-05-22 ENCOUNTER — Other Ambulatory Visit (HOSPITAL_COMMUNITY): Payer: Self-pay | Admitting: Internal Medicine

## 2018-06-07 ENCOUNTER — Telehealth: Payer: Self-pay | Admitting: Hematology

## 2018-06-07 ENCOUNTER — Encounter: Payer: Self-pay | Admitting: Hematology

## 2018-06-07 NOTE — Telephone Encounter (Signed)
New hematology referral received from Dr. Dorthy Cooler for persistent leukocytosis. Pt has been scheduled to see Dr. Irene Limbo on 8/19 at 11am. Pt aware to arrive 30 minutes early. Letter mailed.

## 2018-06-17 ENCOUNTER — Inpatient Hospital Stay: Payer: BLUE CROSS/BLUE SHIELD

## 2018-06-17 ENCOUNTER — Telehealth: Payer: Self-pay | Admitting: Hematology

## 2018-06-17 ENCOUNTER — Inpatient Hospital Stay: Payer: BLUE CROSS/BLUE SHIELD | Attending: Hematology | Admitting: Hematology

## 2018-06-17 ENCOUNTER — Encounter: Payer: Self-pay | Admitting: Hematology

## 2018-06-17 VITALS — BP 130/88 | HR 85 | Temp 98.5°F | Resp 18 | Ht 62.0 in | Wt 190.6 lb

## 2018-06-17 DIAGNOSIS — Z794 Long term (current) use of insulin: Secondary | ICD-10-CM | POA: Diagnosis not present

## 2018-06-17 DIAGNOSIS — E669 Obesity, unspecified: Secondary | ICD-10-CM | POA: Insufficient documentation

## 2018-06-17 DIAGNOSIS — Z79899 Other long term (current) drug therapy: Secondary | ICD-10-CM | POA: Insufficient documentation

## 2018-06-17 DIAGNOSIS — D72829 Elevated white blood cell count, unspecified: Secondary | ICD-10-CM

## 2018-06-17 DIAGNOSIS — G43909 Migraine, unspecified, not intractable, without status migrainosus: Secondary | ICD-10-CM

## 2018-06-17 DIAGNOSIS — E119 Type 2 diabetes mellitus without complications: Secondary | ICD-10-CM

## 2018-06-17 DIAGNOSIS — I5022 Chronic systolic (congestive) heart failure: Secondary | ICD-10-CM | POA: Diagnosis not present

## 2018-06-17 DIAGNOSIS — I110 Hypertensive heart disease with heart failure: Secondary | ICD-10-CM | POA: Diagnosis not present

## 2018-06-17 LAB — CMP (CANCER CENTER ONLY)
ALK PHOS: 77 U/L (ref 38–126)
ALT: 15 U/L (ref 0–44)
ANION GAP: 7 (ref 5–15)
AST: 11 U/L — ABNORMAL LOW (ref 15–41)
Albumin: 4.3 g/dL (ref 3.5–5.0)
BILIRUBIN TOTAL: 0.3 mg/dL (ref 0.3–1.2)
BUN: 8 mg/dL (ref 6–20)
CO2: 29 mmol/L (ref 22–32)
Calcium: 9.7 mg/dL (ref 8.9–10.3)
Chloride: 103 mmol/L (ref 98–111)
Creatinine: 0.83 mg/dL (ref 0.44–1.00)
Glucose, Bld: 91 mg/dL (ref 70–99)
Potassium: 4.2 mmol/L (ref 3.5–5.1)
Sodium: 139 mmol/L (ref 135–145)
TOTAL PROTEIN: 8.1 g/dL (ref 6.5–8.1)

## 2018-06-17 LAB — CBC WITH DIFFERENTIAL/PLATELET
Basophils Absolute: 0 10*3/uL (ref 0.0–0.1)
Basophils Relative: 0 %
Eosinophils Absolute: 0.2 10*3/uL (ref 0.0–0.5)
Eosinophils Relative: 2 %
HEMATOCRIT: 39.5 % (ref 34.8–46.6)
HEMOGLOBIN: 12.5 g/dL (ref 11.6–15.9)
LYMPHS ABS: 2.5 10*3/uL (ref 0.9–3.3)
Lymphocytes Relative: 21 %
MCH: 26.9 pg (ref 25.1–34.0)
MCHC: 31.6 g/dL (ref 31.5–36.0)
MCV: 84.9 fL (ref 79.5–101.0)
Monocytes Absolute: 1.4 10*3/uL — ABNORMAL HIGH (ref 0.1–0.9)
Monocytes Relative: 12 %
NEUTROS ABS: 7.7 10*3/uL — AB (ref 1.5–6.5)
NEUTROS PCT: 65 %
Platelets: 358 10*3/uL (ref 145–400)
RBC: 4.65 MIL/uL (ref 3.70–5.45)
RDW: 13.4 % (ref 11.2–14.5)
WBC: 11.9 10*3/uL — ABNORMAL HIGH (ref 3.9–10.3)

## 2018-06-17 LAB — LACTATE DEHYDROGENASE: LDH: 144 U/L (ref 98–192)

## 2018-06-17 LAB — SEDIMENTATION RATE: Sed Rate: 19 mm/hr (ref 0–22)

## 2018-06-17 LAB — RETICULOCYTES
RBC.: 4.65 MIL/uL (ref 3.70–5.45)
RETIC CT PCT: 0.7 % (ref 0.7–2.1)
Retic Count, Absolute: 32.6 10*3/uL — ABNORMAL LOW (ref 33.7–90.7)

## 2018-06-17 NOTE — Telephone Encounter (Signed)
Patient given avs report and sent back to lab. Per 8/19 los f/u as needed.

## 2018-06-17 NOTE — Progress Notes (Signed)
HEMATOLOGY/ONCOLOGY CONSULTATION NOTE  Date of Service: 06/17/2018  Sara [name] Care Team: Sara Rend, MD as PCP - General (Endocrinology)  CHIEF COMPLAINTS/PURPOSE OF CONSULTATION:  Leukocytosis  HISTORY OF PRESENTING ILLNESS:   Sara [name] is a wonderful 32 y.o. female who has been referred to [place] by Dr. Lujean [name] for evaluation and management of Leukocytosis. Sara [name] reports that she is doing well overall.   Sara [name] reports that her WBC have been high historically, and consultation of her labs to 2016 reveal fluctuation between 8k-18k. She denies anything in particular triggering Sara work up now as opposed to sooner. She notes that she has had increased stress related to her job in Sara last 6 months.   Sara [name] notes that she is having migraines about twice a week and she is not taking preventative medications, and has seen a neurologist before.   Sara [name] notes that she was diagnosed with DM type II six years ago and denies neuropathy. She notes that she was diagnosed with chronic systolic heart falure in 3500 after a pneumonia infection and adds that her ejection fraction has since normalized.   Sara [name] notes that she has very bad environmental allergies and takes Xyzal and Flonase. Sara [name] notes that she has a skin rash that occasionally presents on her neck for 3- 4 days, which began 4-5 months ago. She associates Sara rash's occurrence with stress and notes that it resolves on its own.   Sara [name] denies any recent infections or concerns for vaginal infections. Sara [name] adds that she recently began Victoza which has been accompanied by intentional weight loss of 10 pounds. She denies feeling any differently recently as compared to 6 months to a year ago.   Sara [name] notes that she was having several yeast infections, twice a month until last September when she had Nexplanon removed.   Most recent lab results (05/22/18) of CBC is as follows: all values are WNL except for WBC at  13.7k.  On review of systems, [name] reports stable energy levels, occasional skin rash, intentional weight loss, persisting environmental allergies, normal bowel habits, and denies joint pain or swelling, vaginal discharge, pain along Sara spine, back pains, abdominal pains, lower abdominal pains, leg swelling, and any other symptoms.   On PMHx Sara [name] reports DM with renal manifestations, migraines, chronic systolic heart failure, obesity, goiter, panic disorder, preeclampsia in 2010. On Social Hx Sara [name] reports a couple times smoking marijuana several years ago, and denies recreational drug use and cigarette smoking.  On Family Hx Sara [name] reports DM and HTN and denies blood disorders.   MEDICAL HISTORY:  Past Medical History:  Diagnosis Date  . Anemia   . Anxiety    diagnosed in 2007  . CHF (congestive heart failure) ([place])   . Diabetes mellitus    type 2 - uncontrolled  . Enlarged thyroid   . Hypertension    benign essential  . Infected pilonidal cyst   . Migraine   . Non-reassuring fetal heart rate or rhythm affecting mother   . Pneumonia    history of   . Vision abnormalities     SURGICAL HISTORY: Past Surgical History:  Procedure Laterality Date  . LAPAROSCOPIC TUBAL LIGATION Bilateral 07/03/2017   Procedure: LAPAROSCOPIC BILATERAL TUBAL LIGATION;  Surgeon: Alden Hipp, MD;  Location: [facility];  Service: Gynecology;  Laterality: Bilateral;  . NO PAST SURGERIES      SOCIAL HISTORY: Social History   Socioeconomic History  .  Marital status: Divorced    Spouse name: Not on file  . Number of children: 1  . Years of education: 11  . Highest education level: Not on file  Occupational History  . Not on file  Social Needs  . Financial resource strain: Not on file  . Food insecurity:    Worry: Not on file    Inability: Not on file  . Transportation needs:    Medical: Not on file    Non-medical: Not on file  Tobacco Use  . Smoking status: Never Smoker  . Smokeless  tobacco: Never Used  Substance and Sexual Activity  . Alcohol use: Yes    Alcohol/week: 0.0 standard drinks    Comment: occ  . Drug use: No  . Sexual activity: Yes    Birth control/protection: Implant  Lifestyle  . Physical activity:    Days per week: Not on file    Minutes per session: Not on file  . Stress: Not on file  Relationships  . Social connections:    Talks on phone: Not on file    Gets together: Not on file    Attends religious service: Not on file    Active member of club or organization: Not on file    Attends meetings of clubs or organizations: Not on file    Relationship status: Not on file  . Intimate partner violence:    Fear of current or ex partner: Not on file    Emotionally abused: Not on file    Physically abused: Not on file    Forced sexual activity: Not on file  Other Topics Concern  . Not on file  Social History Narrative   Lives with boyfriend and child   Is pregnant (20weeks) with second child   1st born is a boy and her second is a boy   Drinks one cup of coffee a day    FAMILY HISTORY: Family History  Problem Relation Age of Onset  . Diabetes Mother   . Migraines Mother   . Hypertension Father   . Diabetes Maternal Grandmother   . Diabetes Paternal Grandmother   . Healthy Brother   . Healthy Brother     ALLERGIES:  is allergic to septra ds [sulfamethoxazole-trimethoprim]; bidil [isosorb dinitrate-hydralazine]; lactose intolerance (gi); lobster [shellfish allergy]; pineapple; latex; tape; and terconazole.  MEDICATIONS:  Current Outpatient Medications  Medication Sig Dispense Refill  . ALPRAZolam (XANAX) 0.5 MG tablet Take 0.5 mg by mouth 2 (two) times daily as needed for anxiety.    Marland Kitchen amLODipine (NORVASC) 5 MG tablet Take 1 tablet (5 mg total) by mouth daily. (Sara [name] taking differently: Take 5 mg by mouth at bedtime. ) 30 tablet 6  . butalbital-acetaminophen-caffeine (FIORICET, ESGIC) 50-325-40 MG tablet Take 1 tablet by mouth every  4 (four) hours as needed for headache. 14 tablet 0  . carvedilol (COREG) 6.25 MG tablet TAKE 1 TABLET BY MOUTH TWICE DAILY WITH A MEAL. 60 tablet 3  . CORLANOR 7.5 MG TABS tablet TAKE 1 TABLET(7.5 MG) BY MOUTH TWICE DAILY WITH A MEAL 60 tablet 3  . CORLANOR 7.5 MG TABS tablet TAKE 1 TABLET(7.5 MG) BY MOUTH TWICE DAILY WITH A MEAL 60 tablet 5  . insulin aspart (NOVOLOG) 100 UNIT/ML injection Inject 0-30 Units into Sara skin See admin instructions. Dose per sliding scale. Before lunch and with supper.    . insulin glargine (LANTUS) 100 UNIT/ML injection Inject 36 Units into Sara skin at bedtime.     Marland Kitchen  ivabradine (CORLANOR) 7.5 MG TABS tablet Take 1 tablet (7.5 mg total) by mouth 2 (two) times daily with a meal. 60 tablet 3  . levocetirizine (XYZAL) 5 MG tablet Take 5 mg by mouth every evening.    . Multiple Vitamin (MULTIVITAMIN WITH MINERALS) TABS tablet Take 1 tablet by mouth at bedtime.    . sacubitril-valsartan (ENTRESTO) 97-103 MG Take 1 tablet by mouth 2 (two) times daily. 180 tablet 3  . sertraline (ZOLOFT) 100 MG tablet Take 100 mg by mouth daily.    Marland Kitchen spironolactone (ALDACTONE) 25 MG tablet TAKE 1 TABLET(25 MG) BY MOUTH DAILY 30 tablet 6  . furosemide (LASIX) 20 MG tablet Take 1 tablet (20 mg total) by mouth as needed (for weight gain). (Sara [name] taking differently: Take 20 mg by mouth daily as needed (for weight gain). ) 30 tablet 3   No current facility-administered medications for this visit.     REVIEW OF SYSTEMS:    10 Point review of Systems was done is negative except as noted above.  PHYSICAL EXAMINATION:  . Vitals:   06/17/18 1139  BP: 130/88  Pulse: 85  Resp: 18  Temp: 98.5 F (36.9 C)  SpO2: 100%   Filed Weights   06/17/18 1139  Weight: 190 lb 9.6 oz (86.5 kg)   .Body mass index is 34.86 kg/m.  GENERAL:alert, in no acute distress and comfortable SKIN: no acute rashes, no significant lesions EYES: conjunctiva are pink and non-injected, sclera  anicteric OROPHARYNX: MMM, no exudates, no oropharyngeal erythema or ulceration NECK: supple, no JVD, enlarged thyroid LYMPH:  no palpable lymphadenopathy in Sara cervical, axillary or inguinal regions LUNGS: clear to auscultation b/l with normal respiratory effort HEART: regular rate & rhythm ABDOMEN:  normoactive bowel sounds , non tender, not distended. Extremity: no pedal edema PSYCH: alert & oriented x 3 with fluent speech NEURO: no focal motor/sensory deficits  LABORATORY DATA:  I have reviewed Sara data as listed  . CBC Latest Ref Rng & Units 06/17/2018 02/26/2018 09/24/2017  WBC 3.9 - 10.3 K/uL 11.9(H) 15.2(H) 13.8(H)  Hemoglobin 11.6 - 15.9 g/dL 12.5 12.4 12.3  Hematocrit 34.8 - 46.6 % 39.5 38.1 38.1  Platelets 145 - 400 K/uL 358 349 374   . CBC    Component Value Date/Time   WBC 11.9 (H) 06/17/2018 1245   RBC 4.65 06/17/2018 1245   RBC 4.65 06/17/2018 1245   HGB 12.5 06/17/2018 1245   HCT 39.5 06/17/2018 1245   PLT 358 06/17/2018 1245   MCV 84.9 06/17/2018 1245   MCH 26.9 06/17/2018 1245   MCHC 31.6 06/17/2018 1245   RDW 13.4 06/17/2018 1245   LYMPHSABS 2.5 06/17/2018 1245   MONOABS 1.4 (H) 06/17/2018 1245   EOSABS 0.2 06/17/2018 1245   BASOSABS 0.0 06/17/2018 1245   ANC 7.7 . CMP Latest Ref Rng & Units 06/17/2018 02/26/2018 09/24/2017  Glucose 70 - 99 mg/dL 91 287(H) 97  BUN 6 - 20 mg/dL 8 11 7   Creatinine 0.44 - 1.00 mg/dL 0.83 0.82 0.71  Sodium 135 - 145 mmol/L 139 134(L) 136  Potassium 3.5 - 5.1 mmol/L 4.2 4.4 4.0  Chloride 98 - 111 mmol/L 103 101 105  CO2 22 - 32 mmol/L 29 24 25   Calcium 8.9 - 10.3 mg/dL 9.7 9.6 9.6  Total Protein 6.5 - 8.1 g/dL 8.1 - -  Total Bilirubin 0.3 - 1.2 mg/dL 0.3 - -  Alkaline Phos 38 - 126 U/L 77 - -  AST 15 - 41 U/L 11(L) - -  ALT 0 - 44 U/L 15 - -   05/22/18 CBC:   Historic CBCs:      RADIOGRAPHIC STUDIES: I have personally reviewed Sara radiological images as listed and agreed with Sara findings in Sara report. No  results found.  ASSESSMENT & PLAN:   32 y.o. female with  1. Leukocytosis - neutrophilia -Discussed Sara [name]'s most recent labs from 05/22/18, WBC at 13.7k, normal PLT and no anemia. No differential has been made available.  -Discussed that [name]'s WBC have fluctuated for Sara last 3 years between about 8k-18k, which is reassuring against a primary BM problem -[name]'s persisting environmental allergies, frequent migraines, recurring yeast infections, obesity and significant stress are all concerns for her pattern of fluctuating WBC elevation -Will order additional labs today -- WBC counts improved to 11.9k with resolving neutrophila -Smear - no increased blasts or significant left side -continue f/u with PCP.  -Kindly resconsult [place] if increasing wbc count >20k or significant new blood count abnormalities.   Labs today RTC with Dr Irene Limbo as needed based on labs    All of Sara patients questions were answered with apparent satisfaction. Sara Sara [name] knows to call Sara clinic with any problems, questions or concerns.  Sara total time spent in Sara appt was 35 minutes and more than 50% was on counseling and direct Sara [name] cares.      Sullivan Lone MD MS AAHIVMS [facility] Hematology/Oncology Physician [facility]  (Office):       [phone] (Work cell):  [phone] (Fax):           [phone]  06/17/2018 12:26 PM  I, Baldwin [place], am acting as a scribe for Dr. Irene Limbo  .I have reviewed Sara above documentation for accuracy and completeness, and I agree with Sara above. Brunetta Genera MD

## 2018-06-18 ENCOUNTER — Telehealth: Payer: Self-pay

## 2018-06-18 NOTE — Telephone Encounter (Signed)
Completed by Carollee Massed  RTC with Dr Irene Limbo as needed based on labs. Per 8/20 no los

## 2018-07-05 ENCOUNTER — Telehealth (HOSPITAL_COMMUNITY): Payer: Self-pay | Admitting: Pharmacist

## 2018-07-05 NOTE — Telephone Encounter (Signed)
Corlanor PA approved by [employer] through 06/12/19.   Ruta Hinds. Velva Harman, PharmD, BCPS, CPP Clinical Pharmacist Phone: [phone] 07/05/2018 10:31 AM

## 2018-07-21 ENCOUNTER — Other Ambulatory Visit (HOSPITAL_COMMUNITY): Payer: Self-pay | Admitting: Internal Medicine

## 2018-08-09 ENCOUNTER — Telehealth (HOSPITAL_COMMUNITY): Payer: Self-pay

## 2018-08-09 ENCOUNTER — Encounter (HOSPITAL_COMMUNITY): Payer: Self-pay

## 2018-08-09 ENCOUNTER — Emergency Department (HOSPITAL_COMMUNITY)
Admission: EM | Admit: 2018-08-09 | Discharge: 2018-08-09 | Disposition: A | Discharge status: Home / Self Care | Payer: BLUE CROSS/BLUE SHIELD | Attending: Emergency Medicine | Admitting: Emergency Medicine

## 2018-08-09 ENCOUNTER — Emergency Department (HOSPITAL_COMMUNITY): Payer: BLUE CROSS/BLUE SHIELD

## 2018-08-09 DIAGNOSIS — Z79899 Other long term (current) drug therapy: Secondary | ICD-10-CM | POA: Diagnosis not present

## 2018-08-09 DIAGNOSIS — Z91040 Latex allergy status: Secondary | ICD-10-CM | POA: Diagnosis not present

## 2018-08-09 DIAGNOSIS — E119 Type 2 diabetes mellitus without complications: Secondary | ICD-10-CM | POA: Diagnosis not present

## 2018-08-09 DIAGNOSIS — Z794 Long term (current) use of insulin: Secondary | ICD-10-CM | POA: Diagnosis not present

## 2018-08-09 DIAGNOSIS — I5022 Chronic systolic (congestive) heart failure: Secondary | ICD-10-CM | POA: Insufficient documentation

## 2018-08-09 DIAGNOSIS — R0789 Other chest pain: Secondary | ICD-10-CM | POA: Insufficient documentation

## 2018-08-09 DIAGNOSIS — I110 Hypertensive heart disease with heart failure: Secondary | ICD-10-CM | POA: Diagnosis not present

## 2018-08-09 LAB — CBC
HCT: 40.8 % (ref 36.0–46.0)
Hemoglobin: 12.6 g/dL (ref 12.0–15.0)
MCH: 26.3 pg (ref 26.0–34.0)
MCHC: 30.9 g/dL (ref 30.0–36.0)
MCV: 85.2 fL (ref 80.0–100.0)
Platelets: 454 10*3/uL — ABNORMAL HIGH (ref 150–400)
RBC: 4.79 MIL/uL (ref 3.87–5.11)
RDW: 13.2 % (ref 11.5–15.5)
WBC: 15.5 10*3/uL — ABNORMAL HIGH (ref 4.0–10.5)
nRBC: 0 % (ref 0.0–0.2)

## 2018-08-09 LAB — BASIC METABOLIC PANEL
Anion gap: 7 (ref 5–15)
BUN: 11 mg/dL (ref 6–20)
CO2: 25 mmol/L (ref 22–32)
Calcium: 9.7 mg/dL (ref 8.9–10.3)
Chloride: 105 mmol/L (ref 98–111)
Creatinine, Ser: 0.68 mg/dL (ref 0.44–1.00)
GFR calc Af Amer: >60 mL/min (ref >60)
GFR calc non Af Amer: >60 mL/min (ref >60)
Glucose, Bld: 116 mg/dL — ABNORMAL HIGH (ref 70–99)
Potassium: 3.9 mmol/L (ref 3.5–5.1)
Sodium: 137 mmol/L (ref 135–145)

## 2018-08-09 LAB — I-STAT BETA HCG BLOOD, ED (MC, WL, AP ONLY): I-stat hCG, quantitative: <5 m[IU]/mL (ref <5)

## 2018-08-09 LAB — I-STAT TROPONIN, ED
Troponin i, poc: 0 ng/mL (ref 0.00–0.08)
Troponin i, poc: 0 ng/mL (ref 0.00–0.08)

## 2018-08-09 NOTE — ED Provider Notes (Signed)
[facility] EMERGENCY DEPARTMENT Provider Note   CSN: [id number] Arrival date & time: 08/09/18  1112     History   Chief Complaint Chief Complaint  Patient presents with  . Chest Pain    HPI Sara [name] is a 32 y.o. female with PMHx CHR, T2DM, HTN, presenting to the ED with complaint of gradual onset left sided chest pain/pressure that began at 10am today. 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 Pain is not worse with exertion. No medications taken for pain. She felt some intermittent palpitations last night, which is not new for her, though the chest pain is not normal. She denies assoc nausea, diaphoresis, radiation of pain, SOB, cough, URI sx, LE swelling. No hx DVT/PE, no recent travel/surgery, no hemoptysis, no exogenous estrogen use. CHF is controlled per patient with medications. Last ECHO in Feb 2019 with EF 50-55%.   The history is provided by the patient.    Past Medical History:  Diagnosis Date  . Anemia   . Anxiety    diagnosed in 2007  . CHF (congestive heart failure) ([place])   . Diabetes mellitus    type 2 - uncontrolled  . Enlarged thyroid   . Hypertension    benign essential  . Infected pilonidal cyst   . Migraine   . Non-reassuring fetal heart rate or rhythm affecting mother   . Pneumonia    history of   . Vision abnormalities     Patient Active Problem List   Diagnosis Date Noted  . Vertigo 03/05/2018  . Abnormal brain MRI 03/05/2018  . Premature birth 03/05/2018  . Palpitations 09/25/2017  . Chronic systolic CHF (congestive heart failure), NYHA class 3 ([place]) 02/09/2016  . HTN, goal below 130/80 02/09/2016  . Essential hypertension   . Breastfeeding (infant) 01/27/2016  . Abnormal EKG 01/27/2016  . Preterm spontaneous labor with preterm delivery 04/02/2015  . Normal vaginal delivery 04/01/2015  . Preterm premature rupture of membranes (PPROM) with onset of labor within 24 hours of  rupture in first trimester, antepartum 03/31/2015  . Pre-eclampsia in third trimester 03/16/2015  . Pre-existing type 2 diabetes mellitus in pregnancy in third trimester   . Preterm contractions 03/13/2015  . Diabetes mellitus with insulin therapy ([place]) 03/12/2015  . BMI 36.0-36.9,adult 03/12/2015  . Herpes simplex type 2 (HSV-2) infection affecting pregnancy, antepartum 03/12/2015  . Hemorrhoids 03/12/2015  . Latex allergy - rash 03/12/2015  . 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 History of migraine headaches - with aura 12/12/2014  . History of preterm delivery, currently pregnant 12/05/2014  . Generalized anxiety disorder 12/05/2014  . Anemia 12/05/2014  . Pilonidal abscess 09/30/2012    Past Surgical History:  Procedure Laterality Date  . LAPAROSCOPIC TUBAL LIGATION Bilateral 07/03/2017   Procedure: LAPAROSCOPIC BILATERAL TUBAL LIGATION;  Surgeon: Alden Hipp, MD;  Location: [facility];  Service: Gynecology;  Laterality: Bilateral;  . NO PAST SURGERIES       OB History    Gravida  3   Para  2   Term      Preterm  2   AB  1   Living  1     SAB  1   TAB      Ectopic      Multiple  0   Live Births  1  Home Medications    Prior to Admission medications   Medication Sig Start Date End Date Taking? Authorizing Provider  ALPRAZolam Duanne Moron) 0.5 MG tablet Take 0.5 mg by mouth 2 (two) times daily as needed for anxiety.   Yes [provider]  amLODipine (NORVASC) 5 MG tablet TAKE 1 TABLET BY MOUTH DAILY 07/22/18  Yes Bensimhon, Shaune Pascal, MD  butalbital-acetaminophen-caffeine (FIORICET, ESGIC) [phone] MG tablet Take 1 tablet by mouth every 4 (four) hours as needed for headache. 02/02/16  Yes Theodis Blaze, MD  carvedilol (COREG) 6.25 MG tablet TAKE 1 TABLET BY MOUTH TWICE DAILY WITH A MEAL. Patient taking differently: Take 6.25 mg by mouth 2 (two) times daily with a meal.   04/18/18  Yes Larey Dresser, MD  fluticasone [facility]) 50 MCG/ACT nasal spray Place 1 spray into both nostrils daily.   Yes [provider]  insulin aspart (NOVOLOG) 100 UNIT/ML injection Inject 0-30 Units into the skin See admin instructions. Dose per sliding scale. Before lunch and with supper.   Yes [provider]  insulin glargine (LANTUS) 100 UNIT/ML injection Inject 32 Units into the skin at bedtime.    Yes [provider]  levocetirizine (XYZAL) 5 MG tablet Take 5 mg by mouth every evening.   Yes [provider]  Multiple Vitamin (MULTIVITAMIN WITH MINERALS) TABS tablet Take 1 tablet by mouth at bedtime.   Yes [provider]  sacubitril-valsartan (ENTRESTO) 97-103 MG Take 1 tablet by mouth 2 (two) times daily. 01/29/18  Yes Bensimhon, Shaune Pascal, MD  spironolactone (ALDACTONE) 25 MG tablet TAKE 1 TABLET(25 MG) BY MOUTH DAILY Patient taking differently: Take 25 mg by mouth daily. TAKE 1 TABLET(25 MG) BY MOUTH DAILY 05/01/18  Yes Bensimhon, Shaune Pascal, MD  VICTOZA 18 MG/3ML SOPN Inject 1.8 mg into the skin daily. 08/06/18  Yes [provider]  CORLANOR 7.5 MG TABS tablet TAKE 1 TABLET(7.5 MG) BY MOUTH TWICE DAILY WITH A MEAL Patient not taking: Reported on 08/09/2018 05/22/18   Bensimhon, Shaune Pascal, MD  CORLANOR 7.5 MG TABS tablet TAKE 1 TABLET(7.5 MG) BY MOUTH TWICE DAILY WITH A MEAL Patient not taking: Reported on 08/09/2018 05/22/18   Bensimhon, Shaune Pascal, MD  furosemide (LASIX) 20 MG tablet Take 1 tablet (20 mg total) by mouth as needed (for weight gain). Patient taking differently: Take 20 mg by mouth daily as needed (for weight gain).  06/12/17 04/04/18  Theora Gianotti, NP  ivabradine (CORLANOR) 7.5 MG TABS tablet Take 1 tablet (7.5 mg total) by mouth 2 (two) times daily with a meal. Patient not taking: Reported on 08/09/2018 05/21/17   Bensimhon, Shaune Pascal, MD    Family History Family History  Problem Relation Age of Onset  .  Diabetes Mother   . Migraines Mother   . Hypertension Father   . Diabetes Maternal Grandmother   . Diabetes Paternal Grandmother   . Healthy Brother   . Healthy Brother     Social History Social History   Tobacco Use  . Smoking status: Never Smoker  . Smokeless tobacco: Never Used  Substance Use Topics  . Alcohol use: Yes    Alcohol/week: 0.0 standard drinks    Comment: occ  . Drug use: No     Allergies   Septra ds [sulfamethoxazole-trimethoprim]; Almond (diagnostic); Bidil [isosorb dinitrate-hydralazine]; Lactose intolerance (gi); Lobster [shellfish allergy]; Pineapple; Latex; Tape; and Terconazole   Review of Systems Review of Systems  Constitutional: Negative for diaphoresis.  Respiratory: Negative for cough and  shortness of breath.   Cardiovascular: Positive for chest pain and palpitations. Negative for leg swelling.  Gastrointestinal: Negative for nausea.  All other systems reviewed and are negative.    Physical Exam Updated Vital Signs BP 127/72   Pulse 95   Temp 98.4 F (36.9 C) (Oral)   Resp 14   Ht 5\' 2"  (1.575 m)   Wt 83.9 kg   LMP 07/30/2018 (Approximate)   SpO2 100%   BMI 33.84 kg/m   Physical Exam  Constitutional: She appears well-developed and well-nourished. She does not appear ill. No distress.  HENT:  Head: Normocephalic and atraumatic.  Eyes: Conjunctivae are normal.  Neck: Normal range of motion. Neck supple. No JVD present. No tracheal deviation present.  Cardiovascular: Normal rate, regular rhythm, normal heart sounds and intact distal pulses.  Pulmonary/Chest: Effort normal and breath sounds normal. No respiratory distress. She exhibits no tenderness.  Abdominal: Soft. Bowel sounds are normal. She exhibits no distension. There is no tenderness. There is no guarding.  Musculoskeletal: She exhibits no edema.  Neurological: She is alert.  Skin: Skin is warm. She is not diaphoretic.  Psychiatric: She has a normal mood and affect. Her  behavior is normal.  Nursing note and vitals reviewed.    ED Treatments / Results  Labs (all labs ordered are listed, but only abnormal results are displayed) Labs Reviewed  BASIC METABOLIC PANEL - Abnormal; Notable for the following components:      Result Value   Glucose, Bld 116 (*)    All other components within normal limits  CBC - Abnormal; Notable for the following components:   WBC 15.5 (*)    Platelets 454 (*)    All other components within normal limits  I-STAT TROPONIN, ED  I-STAT BETA HCG BLOOD, ED ([facility], [facility], [facility] ONLY)  I-STAT TROPONIN, ED    EKG EKG Interpretation  Date/Time:  Friday August 09 2018 11:16:03 EDT Ventricular Rate:  102 PR Interval:  156 QRS Duration: 94 QT Interval:  356 QTC Calculation: 463 R Axis:   83 Text Interpretation:  Sinus tachycardia Non-specific ST-t changes Confirmed by Virgel Manifold [id number]) on 08/09/2018 11:56:15 AM Also confirmed by Virgel Manifold [phone]), editor Hattie Perch [phone])  on 08/09/2018 2:04:26 PM   Radiology Dg Chest 2 View  Result Date: 08/09/2018 CLINICAL DATA:  Left chest pain palpitations EXAM: CHEST - 2 VIEW COMPARISON:  February 25, 2018 FINDINGS: The heart size and mediastinal contours are within normal limits. There is no focal infiltrate, pulmonary edema, or pleural effusion. The visualized skeletal structures are unremarkable. IMPRESSION: No active cardiopulmonary disease. Electronically Signed   By: Abelardo Diesel M.D.   On: 08/09/2018 11:53    Procedures Procedures (including critical care time)  Medications Ordered in ED Medications - No data to display   Initial Impression / Assessment and Plan / ED Course  I have reviewed the triage vital signs and the nursing notes.  Pertinent labs & imaging results that were available during my care of the patient were reviewed by me and considered in my medical decision making (see chart for details).     Pt presenting with atypical chest pain that began  at rest today. Chest pain is not likely of cardiac or pulmonary etiology d/t presentation, no signs of acute exacerbation of heart failure, PERC negative, VSS, no tracheal deviation, no JVD or new murmur, RRR, breath sounds equal bilaterally, EKG nonspecific changes, negative troponin x2, and negative CXR. Low heart score. Patient is to be  discharged with recommendation to follow up with PCP in regards to today's hospital visit. Pt has been advised to return to the ED if CP becomes exertional, associated with diaphoresis or nausea, radiates to left jaw/arm, worsens or becomes concerning in any way. Pt appears reliable for follow up and is agreeable to discharge.   Discussed results, findings, treatment and follow up. Patient advised of return precautions. Patient verbalized understanding and agreed with plan.  Final Clinical Impressions(s) / ED Diagnoses   Final diagnoses:  Atypical chest pain    ED Discharge Orders    None       Robinson, [place] N, PA-C 08/09/18 1606    Virgel Manifold, MD 08/09/18 2315

## 2018-08-09 NOTE — Telephone Encounter (Signed)
She has normal EF and previous monitor showed palpitations likely PVCs, which can increase when dehydrated. If she has diarrhea she should make sure to stay hydrated, and it is OK to take Immodium as directed on the box.    If diarrhea does not improve she should see her PCP.    Legrand Como [address]" [place], PA-C 08/09/2018 9:50 AM

## 2018-08-09 NOTE — Discharge Instructions (Signed)
Please read instructions below. Return to the ER for new or worsening symptoms; including worsening chest pain, shortness of breath, pain that radiates to the arm or neck, pain or shortness of breath worsened with exertion. Follow up with your primary care provider/cardiologist to follow up on your visit today.

## 2018-08-09 NOTE — Telephone Encounter (Signed)
Pt called and stated that she woke up at 6am with heart palpitations and diarrhea. Pt is concerned that she is dehydrated. Last ECG was 04/19. Please advise.Marland Kitchen

## 2018-08-09 NOTE — ED Triage Notes (Signed)
Pt presents for evaluation of L sided chest pain and palpitations. Reports hx of CHF. Denies edema. States hx of palpitations.

## 2018-08-09 NOTE — Telephone Encounter (Signed)
Pt notified and verbalizes understanding. Pt states she is on her way to ED due to having chest pain.

## 2018-09-16 ENCOUNTER — Telehealth (HOSPITAL_COMMUNITY): Payer: Self-pay

## 2018-09-16 DIAGNOSIS — I5022 Chronic systolic (congestive) heart failure: Secondary | ICD-10-CM

## 2018-09-17 ENCOUNTER — Other Ambulatory Visit (HOSPITAL_COMMUNITY): Payer: Self-pay | Admitting: Surgery

## 2018-09-17 NOTE — Telephone Encounter (Signed)
Opened in error

## 2018-09-19 ENCOUNTER — Telehealth: Payer: Self-pay | Admitting: Medical

## 2018-09-19 NOTE — Telephone Encounter (Signed)
Patient called with complaints of coughing and SOB worsening over the past week. She was seen at an urgent care last week with similar complaints. CXR at that time was reportedly normal and influenza testing was negative. She was told it was a viral infection and sent home with cough suppressants for supportive care. She has continued to feel poorly since that time. Sounds congested on the phone with frequent dry coughing. She reports some chest discomfort with coughing/sneezing and SOB. No fevers. She denies orthopnea, PND, LE edema, or significant weight gain suggestive of CHF. She has not needed to take her lasix in quite some time. Symptoms more suspicious for possible progression to PNA given prolonged illness. Recommended she call her PCP office first thing in the morning to try to get an appointment to be seen for further evaluation. Recommended if SOB worsens or she develops chest pain at rest she should present to the ED for further evaluation. Given presentation for systolic CHF in the past and possible viral component to her cardiomyopathy, recommended she contact Dr. Clayborne Dana office in the morning as well to follow-up. Patient in agreement with the plan. Will route this note to Dr. Haroldine Laws.   Abigail Butts, PA-C 09/19/18

## 2018-09-20 ENCOUNTER — Other Ambulatory Visit: Payer: Self-pay | Admitting: Family Medicine

## 2018-09-20 ENCOUNTER — Ambulatory Visit
Admission: RE | Admit: 2018-09-20 | Discharge: 2018-09-20 | Disposition: A | Discharge status: Home / Self Care | Payer: BLUE CROSS/BLUE SHIELD | Source: Ambulatory Visit | Attending: Family Medicine | Admitting: Family Medicine

## 2018-09-20 DIAGNOSIS — R05 Cough: Secondary | ICD-10-CM

## 2018-09-20 DIAGNOSIS — R059 Cough, unspecified: Secondary | ICD-10-CM

## 2018-09-21 ENCOUNTER — Emergency Department (HOSPITAL_COMMUNITY)
Admission: EM | Admit: 2018-09-21 | Discharge: 2018-09-21 | Disposition: A | Discharge status: Home / Self Care | Payer: BLUE CROSS/BLUE SHIELD | Attending: Emergency Medicine | Admitting: Emergency Medicine

## 2018-09-21 ENCOUNTER — Encounter (HOSPITAL_COMMUNITY): Payer: Self-pay | Admitting: *Deleted

## 2018-09-21 ENCOUNTER — Other Ambulatory Visit: Payer: Self-pay

## 2018-09-21 DIAGNOSIS — R51 Headache: Secondary | ICD-10-CM | POA: Diagnosis present

## 2018-09-21 DIAGNOSIS — R05 Cough: Secondary | ICD-10-CM | POA: Diagnosis not present

## 2018-09-21 DIAGNOSIS — M7918 Myalgia, other site: Secondary | ICD-10-CM | POA: Insufficient documentation

## 2018-09-21 DIAGNOSIS — B349 Viral infection, unspecified: Secondary | ICD-10-CM | POA: Diagnosis not present

## 2018-09-21 DIAGNOSIS — R52 Pain, unspecified: Secondary | ICD-10-CM

## 2018-09-21 DIAGNOSIS — R059 Cough, unspecified: Secondary | ICD-10-CM

## 2018-09-21 DIAGNOSIS — R519 Headache, unspecified: Secondary | ICD-10-CM

## 2018-09-21 LAB — COMPREHENSIVE METABOLIC PANEL
ALBUMIN: 4 g/dL (ref 3.5–5.0)
ALK PHOS: 74 U/L (ref 38–126)
ALT: 42 U/L (ref 0–44)
ANION GAP: 8 (ref 5–15)
AST: 27 U/L (ref 15–41)
BILIRUBIN TOTAL: 0.4 mg/dL (ref 0.3–1.2)
CALCIUM: 9.4 mg/dL (ref 8.9–10.3)
CO2: 25 mmol/L (ref 22–32)
Chloride: 103 mmol/L (ref 98–111)
Creatinine, Ser: 0.77 mg/dL (ref 0.44–1.00)
GFR calc Af Amer: >60 mL/min (ref >60)
GFR calc non Af Amer: >60 mL/min (ref >60)
GLUCOSE: 110 mg/dL — AB (ref 70–99)
Potassium: 3.8 mmol/L (ref 3.5–5.1)
Sodium: 136 mmol/L (ref 135–145)
TOTAL PROTEIN: 7.3 g/dL (ref 6.5–8.1)

## 2018-09-21 LAB — CBC
HEMATOCRIT: 43 % (ref 36.0–46.0)
HEMOGLOBIN: 12.4 g/dL (ref 12.0–15.0)
MCH: 24.7 pg — ABNORMAL LOW (ref 26.0–34.0)
MCHC: 28.8 g/dL — AB (ref 30.0–36.0)
MCV: 85.7 fL (ref 80.0–100.0)
Platelets: 243 10*3/uL (ref 150–400)
RBC: 5.02 MIL/uL (ref 3.87–5.11)
RDW: 13.3 % (ref 11.5–15.5)
WBC: 7.1 10*3/uL (ref 4.0–10.5)
nRBC: 0 % (ref 0.0–0.2)

## 2018-09-21 LAB — CBG MONITORING, ED: Glucose-Capillary: 78 mg/dL (ref 70–99)

## 2018-09-21 LAB — I-STAT BETA HCG BLOOD, ED (MC, WL, AP ONLY): I-stat hCG, quantitative: <5 m[IU]/mL (ref <5)

## 2018-09-21 LAB — INFLUENZA PANEL BY PCR (TYPE A & B)
Influenza A By PCR: NEGATIVE
Influenza B By PCR: NEGATIVE

## 2018-09-21 MED ORDER — SODIUM CHLORIDE 0.9 % IV BOLUS: 250.0000 mL | Freq: Once | INTRAVENOUS | Status: DC

## 2018-09-21 MED ORDER — PROCHLORPERAZINE MALEATE 5 MG PO TABS
10.0000 mg | ORAL_TABLET | Freq: Once | ORAL | Status: AC
  Administered 2018-09-21: 10 mg via ORAL
  Filled 2018-09-21: qty 2

## 2018-09-21 MED ORDER — DIPHENHYDRAMINE HCL 50 MG/ML IJ SOLN
25.0000 mg | Freq: Once | INTRAMUSCULAR | Status: AC
  Administered 2018-09-21: 25 mg via INTRAVENOUS
  Filled 2018-09-21: qty 1

## 2018-09-21 MED ORDER — KETOROLAC TROMETHAMINE 15 MG/ML IJ SOLN
15.0000 mg | Freq: Once | INTRAMUSCULAR | Status: AC
  Administered 2018-09-21: 15 mg via INTRAVENOUS
  Filled 2018-09-21: qty 1

## 2018-09-21 MED ORDER — SODIUM CHLORIDE 0.9 % IV BOLUS
250.0000 mL | Freq: Once | INTRAVENOUS | Status: AC
  Administered 2018-09-21: 250 mL via INTRAVENOUS

## 2018-09-21 MED ORDER — HALOPERIDOL LACTATE 5 MG/ML IJ SOLN
3.0000 mg | Freq: Once | INTRAMUSCULAR | Status: AC
  Administered 2018-09-21: 3 mg via INTRAVENOUS
  Filled 2018-09-21: qty 1

## 2018-09-21 MED ORDER — MAGNESIUM SULFATE 2 GM/50ML IV SOLN
2.0000 g | Freq: Once | INTRAVENOUS | Status: AC
  Administered 2018-09-21: 2 g via INTRAVENOUS
  Filled 2018-09-21: qty 50

## 2018-09-21 MED ORDER — SODIUM CHLORIDE 0.9 % IV BOLUS: 500.0000 mL | Freq: Once | INTRAVENOUS | Status: DC

## 2018-09-21 NOTE — ED Notes (Signed)
Patient verbalizes understanding of discharge instructions. Opportunity for questioning and answers were provided. Armband removed by staff, pt discharged from ED.  

## 2018-09-21 NOTE — ED Triage Notes (Signed)
Pt reports having cough, headache and bodyaches for over a week.  Has been to pcp and ucc for same with no diagnosis. Having increase in bodyaches and headache x 5 days. Has nausea, no vomiting. Hx of migraines.

## 2018-09-21 NOTE — Discharge Instructions (Addendum)
Thank you for allowing [place] taking care of you at [facility] emergency department. You were seen for headache and body ache. Your blood test including flu test came back negative. We treated you for migraine headache. We glad that you feel better. Please follow up with your primary care doctor as needed. You can also continue the antibiotic that prescribed yesterday for you by another health care provider. Please come back to emergency room if your symptoms returns. Thanks

## 2018-09-21 NOTE — ED Notes (Signed)
Got patient on the monitor patient is resting with call bell in reach and family at bedside °

## 2018-09-21 NOTE — ED Provider Notes (Addendum)
[facility] EMERGENCY DEPARTMENT Provider Note   CSN: [id number] Arrival date & time: 09/21/18  1433     History   Chief Complaint Chief Complaint  Patient presents with  . Generalized Body Aches  . Headache    HPI Sara [name] is a 32 y.o. female.   32 y/o female with Hx of Migraine headache, CHF, DM type II, presented with worsening of headache and body ache in past 5 days. She reports throbbing pain on back of the head and behind her eyes. No vision changes. Reports photophobia but no phonophobia. Initially had some dry cough, without fever and with no sore throat. She was seen in clinic twice, and was prescribed cough medicine and nasal spray that did not help her. On second visit yesterday, she was prescribed oral augmentine for sinusitis. She mentions that her CXR, EKG and flu test on clinic came back normal.  She came to ED today due to sever headache. Currently, no fever, her cough has improved. Reports 8/10 headache and 6/10 body ache. No nausea or vomiting. No numbness or tingling. Dry cough has improved. Her husband had some dry cough recently but feels ok.       Past Medical History:  Diagnosis Date  . Anemia   . Anxiety    diagnosed in 2007  . CHF (congestive heart failure) ([place])   . Diabetes mellitus    type 2 - uncontrolled  . Enlarged thyroid   . Hypertension    benign essential  . Infected pilonidal cyst   . Migraine   . Non-reassuring fetal heart rate or rhythm affecting mother   . Pneumonia    history of   . Vision abnormalities     Patient Active Problem List   Diagnosis Date Noted  . Vertigo 03/05/2018  . Abnormal brain MRI 03/05/2018  . Premature birth 03/05/2018  . Palpitations 09/25/2017  . Chronic systolic CHF (congestive heart failure), NYHA class 3 ([place]) 02/09/2016  . HTN, goal below 130/80 02/09/2016  . Essential hypertension   . Breastfeeding (infant) 01/27/2016  . Abnormal EKG 01/27/2016  . Preterm  spontaneous labor with preterm delivery 04/02/2015  . Normal vaginal delivery 04/01/2015  . Preterm premature rupture of membranes (PPROM) with onset of labor within 24 hours of rupture in first trimester, antepartum 03/31/2015  . Pre-eclampsia in third trimester 03/16/2015  . Pre-existing type 2 diabetes mellitus in pregnancy in third trimester   . Preterm contractions 03/13/2015  . Diabetes mellitus with insulin therapy ([place]) 03/12/2015  . BMI 36.0-36.9,adult 03/12/2015  . Herpes simplex type 2 (HSV-2) infection affecting pregnancy, antepartum 03/12/2015  . Hemorrhoids 03/12/2015  . Latex allergy - rash 03/12/2015  . 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 History of migraine headaches - with aura 12/12/2014  . History of preterm delivery, currently pregnant 12/05/2014  . Generalized anxiety disorder 12/05/2014  . Anemia 12/05/2014  . Pilonidal abscess 09/30/2012    Past Surgical History:  Procedure Laterality Date  . LAPAROSCOPIC TUBAL LIGATION Bilateral 07/03/2017   Procedure: LAPAROSCOPIC BILATERAL TUBAL LIGATION;  Surgeon: Alden Hipp, MD;  Location: [facility];  Service: Gynecology;  Laterality: Bilateral;  . NO PAST SURGERIES       OB History    Gravida  3   Para  2   Term      Preterm  2   AB  1   Living  1  SAB  1   TAB      Ectopic      Multiple  0   Live Births  1            Home Medications    Prior to Admission medications   Medication Sig Start Date End Date Taking? Authorizing Provider  ALPRAZolam Duanne Moron) 0.5 MG tablet Take 0.5 mg by mouth 2 (two) times daily as needed for anxiety.    [provider]  amLODipine (NORVASC) 5 MG tablet TAKE 1 TABLET BY MOUTH DAILY 07/22/18   Bensimhon, Shaune Pascal, MD  butalbital-acetaminophen-caffeine (FIORICET, ESGIC) [phone] MG tablet Take 1 tablet by mouth every 4 (four) hours as needed for headache. 02/02/16   Theodis Blaze, MD    carvedilol (COREG) 6.25 MG tablet TAKE 1 TABLET BY MOUTH TWICE DAILY WITH A MEAL. Patient taking differently: Take 6.25 mg by mouth 2 (two) times daily with a meal.  04/18/18   Larey Dresser, MD  CORLANOR 7.5 MG TABS tablet TAKE 1 TABLET(7.5 MG) BY MOUTH TWICE DAILY WITH A MEAL Patient not taking: Reported on 08/09/2018 05/22/18   Bensimhon, Shaune Pascal, MD  CORLANOR 7.5 MG TABS tablet TAKE 1 TABLET(7.5 MG) BY MOUTH TWICE DAILY WITH A MEAL Patient not taking: Reported on 08/09/2018 05/22/18   Bensimhon, Shaune Pascal, MD  fluticasone (FLONASE) 50 MCG/ACT nasal spray Place 1 spray into both nostrils daily.    [provider]  furosemide (LASIX) 20 MG tablet Take 1 tablet (20 mg total) by mouth as needed (for weight gain). Patient taking differently: Take 20 mg by mouth daily as needed (for weight gain).  06/12/17 04/04/18  Theora Gianotti, NP  insulin aspart (NOVOLOG) 100 UNIT/ML injection Inject 0-30 Units into the skin See admin instructions. Dose per sliding scale. Before lunch and with supper.    [provider]  insulin glargine (LANTUS) 100 UNIT/ML injection Inject 32 Units into the skin at bedtime.     [provider]  ivabradine (CORLANOR) 7.5 MG TABS tablet Take 1 tablet (7.5 mg total) by mouth 2 (two) times daily with a meal. Patient not taking: Reported on 08/09/2018 05/21/17   Bensimhon, Shaune Pascal, MD  levocetirizine (XYZAL) 5 MG tablet Take 5 mg by mouth every evening.    [provider]  Multiple Vitamin (MULTIVITAMIN WITH MINERALS) TABS tablet Take 1 tablet by mouth at bedtime.    [provider]  sacubitril-valsartan (ENTRESTO) 97-103 MG Take 1 tablet by mouth 2 (two) times daily. 01/29/18   Bensimhon, Shaune Pascal, MD  spironolactone (ALDACTONE) 25 MG tablet TAKE 1 TABLET(25 MG) BY MOUTH DAILY Patient taking differently: Take 25 mg by mouth daily. TAKE 1 TABLET(25 MG) BY MOUTH DAILY 05/01/18   Bensimhon, Shaune Pascal, MD  VICTOZA 18 MG/3ML SOPN Inject  1.8 mg into the skin daily. 08/06/18   [provider]    Family History Family History  Problem Relation Age of Onset  . Diabetes Mother   . Migraines Mother   . Hypertension Father   . Diabetes Maternal Grandmother   . Diabetes Paternal Grandmother   . Healthy Brother   . Healthy Brother     Social History Social History   Tobacco Use  . Smoking status: Never Smoker  . Smokeless tobacco: Never Used  Substance Use Topics  . Alcohol use: Yes    Alcohol/week: 0.0 standard drinks    Comment: occ  . Drug use: No     Allergies  Septra ds [sulfamethoxazole-trimethoprim]; Almond (diagnostic); Bidil [isosorb dinitrate-hydralazine]; Lactose intolerance (gi); Lobster [shellfish allergy]; Pineapple; Latex; Tape; and Terconazole   Review of Systems Review of Systems   Physical Exam Updated Vital Signs BP (!) 139/91 (BP Location: Right Arm)   Pulse (!) 108   Temp 98.2 F (36.8 C) (Oral)   Resp 20   SpO2 100%   Physical Exam  Constitutional: She is oriented to person, place, and time. She appears well-developed and well-nourished. Complains of pain.  Head and neck: Mild enderness on maxillary and frontal sinuses no cervical lymphadenopathy.  Mouth/Throat: Oropharynx is clear and moist.  Cardiovascular: Tachycardic, regular rhythm and intact distal pulses.  No murmur heard. Pulmonary/Chest: Effort normal and breath sounds normal. She has no wheezes. She has no rales.  Abdominal: Soft. Bowel sounds are normal. There is no tenderness.  Neurological: She is alert and oriented to person, place, and time.    ED Treatments / Results  Labs (all labs ordered are listed, but only abnormal results are displayed) Labs Reviewed  CBC  COMPREHENSIVE METABOLIC PANEL  INFLUENZA PANEL BY PCR (TYPE A & B)  I-STAT BETA HCG BLOOD, ED ([facility], [facility], [facility] ONLY)    EKG None  Radiology Dg Chest 2 View  Result Date: 09/20/2018 CLINICAL DATA:  Cough EXAM: CHEST - 2 VIEW COMPARISON:   August 09, 2018 FINDINGS: There is no edema or consolidation. The heart size and pulmonary vascularity are normal. No adenopathy. No bone lesions. IMPRESSION: No edema or consolidation. Electronically Signed   By: Lowella Grip III M.D.   On: 09/20/2018 13:45    Procedures Procedures (including critical care time)  Medications Ordered in ED Medications  sodium chloride 0.9 % bolus 250 mL (has no administration in time range)     Initial Impression / Assessment and Plan / ED Course  I have reviewed the triage vital signs and the nursing notes.  Pertinent labs & imaging results that were available during my care of the patient were reviewed by me and considered in my medical decision making (see chart for details).    32 y/o female with Hx of Migraine headache, CHF, DMII, presented with worsening of headache and body ache.  Headache, photophobia, dry cough, body ache, Pressure on frontal and maxillary sinuses.  Headache can also be in setting of Migraine as she has some photophobia.  -Flu test -CMP -Compozine and Benadryl (Avoided Dexa due to DM and HTN) -250 ml Ns and will reevaluate lung as she has CHF -CBC -I stat HCG  Also has Hx of CHF. Denies any weight gain, leg swelling. CXR yesterday did not show edema or congestion.  -Will be cautious about IV fluid -Is on Lasix PRN and has not taken that recently. Still will check electrolyte as can be associated with body ache/weakness  Update: CBC, CMP normal. I stat HCG negative. Flu test negative. Still has headache. Gave Haldol 3 mg and Mg-sulphate 2 gr.-->Patient significantly improved. Her symptoms likely 2/2 migraine. She can continue PO antibiotics that prescribed for her yesterday for possible sinusitis.  Final Clinical Impressions(s) / ED Diagnoses   Final diagnoses:  None    ED Discharge Orders    None       Dewayne Hatch, MD 09/21/18 1924      Dewayne Hatch, MD 09/21/18 2012      Gareth Morgan, MD 09/29/18 1408

## 2018-11-01 ENCOUNTER — Other Ambulatory Visit (HOSPITAL_COMMUNITY): Payer: Self-pay

## 2018-11-01 MED ORDER — CARVEDILOL 6.25 MG PO TABS: ORAL_TABLET | ORAL | 1 refills | Status: DC

## 2018-11-06 ENCOUNTER — Encounter (HOSPITAL_COMMUNITY): Payer: Self-pay | Admitting: Internal Medicine

## 2018-11-06 ENCOUNTER — Other Ambulatory Visit: Payer: Self-pay

## 2018-11-06 ENCOUNTER — Ambulatory Visit (HOSPITAL_BASED_OUTPATIENT_CLINIC_OR_DEPARTMENT_OTHER)
Admission: RE | Admit: 2018-11-06 | Discharge: 2018-11-06 | Disposition: A | Discharge status: Home / Self Care | Payer: BLUE CROSS/BLUE SHIELD | Source: Ambulatory Visit | Attending: Internal Medicine | Admitting: Internal Medicine

## 2018-11-06 ENCOUNTER — Ambulatory Visit (HOSPITAL_COMMUNITY)
Admission: RE | Admit: 2018-11-06 | Discharge: 2018-11-06 | Disposition: A | Discharge status: Home / Self Care | Payer: BLUE CROSS/BLUE SHIELD | Source: Ambulatory Visit | Attending: Internal Medicine | Admitting: Internal Medicine

## 2018-11-06 VITALS — BP 130/94 | HR 103 | Wt 188.0 lb

## 2018-11-06 DIAGNOSIS — F419 Anxiety disorder, unspecified: Secondary | ICD-10-CM | POA: Insufficient documentation

## 2018-11-06 DIAGNOSIS — E049 Nontoxic goiter, unspecified: Secondary | ICD-10-CM | POA: Diagnosis not present

## 2018-11-06 DIAGNOSIS — I5022 Chronic systolic (congestive) heart failure: Secondary | ICD-10-CM | POA: Insufficient documentation

## 2018-11-06 DIAGNOSIS — I110 Hypertensive heart disease with heart failure: Secondary | ICD-10-CM | POA: Diagnosis present

## 2018-11-06 DIAGNOSIS — E119 Type 2 diabetes mellitus without complications: Secondary | ICD-10-CM | POA: Diagnosis not present

## 2018-11-06 DIAGNOSIS — R002 Palpitations: Secondary | ICD-10-CM | POA: Diagnosis not present

## 2018-11-06 DIAGNOSIS — Z8249 Family history of ischemic heart disease and other diseases of the circulatory system: Secondary | ICD-10-CM | POA: Diagnosis not present

## 2018-11-06 DIAGNOSIS — Z833 Family history of diabetes mellitus: Secondary | ICD-10-CM | POA: Insufficient documentation

## 2018-11-06 DIAGNOSIS — Z794 Long term (current) use of insulin: Secondary | ICD-10-CM | POA: Diagnosis not present

## 2018-11-06 DIAGNOSIS — R000 Tachycardia, unspecified: Secondary | ICD-10-CM | POA: Insufficient documentation

## 2018-11-06 DIAGNOSIS — Z79899 Other long term (current) drug therapy: Secondary | ICD-10-CM | POA: Diagnosis not present

## 2018-11-06 LAB — BASIC METABOLIC PANEL
Anion gap: 6 (ref 5–15)
BUN: 9 mg/dL (ref 6–20)
CO2: 25 mmol/L (ref 22–32)
Calcium: 9.4 mg/dL (ref 8.9–10.3)
Chloride: 106 mmol/L (ref 98–111)
Creatinine, Ser: 0.77 mg/dL (ref 0.44–1.00)
GFR calc Af Amer: >60 mL/min (ref >60)
GFR calc non Af Amer: >60 mL/min (ref >60)
Glucose, Bld: 207 mg/dL — ABNORMAL HIGH (ref 70–99)
Potassium: 4.3 mmol/L (ref 3.5–5.1)
Sodium: 137 mmol/L (ref 135–145)

## 2018-11-06 MED ORDER — IVABRADINE HCL 5 MG PO TABS: 5.0000 mg | ORAL_TABLET | Freq: Two times a day (BID) | ORAL | 3 refills | Status: DC

## 2018-11-06 NOTE — Progress Notes (Signed)
  Echocardiogram 2D Echocardiogram has been performed.  Jebediah Macrae G Rommie Dunn 11/06/2018, 9:51 AM

## 2018-11-06 NOTE — Progress Notes (Signed)
Advanced Heart Failure Clinic Note   Patient ID: Sara [name], female   DOB: 1986/07/04, 33 y.o.   MRN: [id number] PCP: Dr Dorthy Cooler Primary Cardiologist: Dr Haroldine Laws  HPI: Sara [name] is a 33 y.o. 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 S/P Tubal ligation 06/2017.    Admitted  March 2017 with increased dsypnea and PND.  Prior  to admit she had a virus. CXR was concerning for CAP. Completed antibiotic course. ECHO performed and showed reduced EF -->35%. Diuresed with IV lasix. Instructed to stop breast feeding with addition of HF meds and to prevent pregnancy. Started on entresto, carvedilol and lasix. Discharge weight was 175 pounds.   Today she returns for 6 month follow up with her husband. Overall feeling fine. Says she is getting over a virus. Has had cough.  Denies SOB/PND/Orthopnea. Appetite ok. No palpitations. No fever or chills. Weight at home  185  pounds. Not exercising much. Taking all medications. She has not had lasix 4-5 months  Working full time. Will be moving to [facility] in May to get a Master's degree.   Echo today: Reviewed personally EF 45-50%  Echo 2/19 EF 55-60%  cMRI 10/17 EF 37% RV normal ECHO 04/2016 ~35-40%  ECHO 12/2015 ~30-35%.  ECHO 04/2017 EF 30-35%.   CPX 7/17 FVC 2.68 (86%)    FEV1 2.43 (91%)       BP rest: 118/64 BP peak: 156/84 Peak VO2: 17.9 (63.9% predicted peak VO2) - corrects to 23.4 fo IBW VE/VCO2 slope: 31.2 OUES: 1.63 Peak RER: 1.17 Ventilatory Threshold: 13.9 (49.6% predicted or measured peak VO2) VE/MVV: 55.9% O2pulse: 10  (83% predicted O2pulse)  SH: Lives with her boyfriend and 2 children. She does not smoke or drink alcohol.   Review of systems complete and found to be negative unless listed in HPI.    SH:  Social History   Socioeconomic History  . Marital status: Divorced    Spouse name: Not on file  . Number of children: 1   . Years of education: 84  . Highest education level: Not on file  Occupational History  . Not on file  Social Needs  . Financial resource strain: Not on file  . Food insecurity:    Worry: Not on file    Inability: Not on file  . Transportation needs:    Medical: Not on file    Non-medical: Not on file  Tobacco Use  . Smoking status: Never Smoker  . Smokeless tobacco: Never Used  Substance and Sexual Activity  . Alcohol use: Yes    Alcohol/week: 0.0 standard drinks    Comment: occ  . Drug use: No  . Sexual activity: Yes    Birth control/protection: Implant  Lifestyle  . Physical activity:    Days per week: Not on file    Minutes per session: Not on file  . Stress: Not on file  Relationships  . Social connections:    Talks on phone: Not on file    Gets together: Not on file    Attends religious service: Not on file    Active member of club or organization: Not on file    Attends meetings of clubs or organizations: Not on file    Relationship status: Not on file  . Intimate partner violence:    Fear of current or ex partner: Not on file    Emotionally abused: Not on file  Physically abused: Not on file    Forced sexual activity: Not on file  Other Topics Concern  . Not on file  Social History Narrative   Lives with boyfriend and child   Is pregnant (20weeks) with second child   1st born is a boy and her second is a boy   Drinks one cup of coffee a day    FH:  Family History  Problem Relation Age of Onset  . Diabetes Mother   . Migraines Mother   . Hypertension Father   . Diabetes Maternal Grandmother   . Diabetes Paternal Grandmother   . Healthy Brother   . Healthy Brother     Past Medical History:  Diagnosis Date  . Anemia   . Anxiety    diagnosed in 2007  . CHF (congestive heart failure) ([place])   . Diabetes mellitus    type 2 - uncontrolled  . Enlarged thyroid   . Hypertension    benign essential  . Infected pilonidal cyst   . Migraine   .  Non-reassuring fetal heart rate or rhythm affecting mother   . Pneumonia    history of   . Vision abnormalities     Current Outpatient Medications  Medication Sig Dispense Refill  . ALPRAZolam (XANAX) 0.5 MG tablet Take 0.5 mg by mouth 2 (two) times daily as needed for anxiety.    Marland Kitchen amLODipine (NORVASC) 5 MG tablet TAKE 1 TABLET BY MOUTH DAILY 30 tablet 5  . butalbital-acetaminophen-caffeine (FIORICET, ESGIC) 50-325-40 MG tablet Take 1 tablet by mouth every 4 (four) hours as needed for headache. 14 tablet 0  . carvedilol (COREG) 6.25 MG tablet TAKE 1 TABLET BY MOUTH TWICE DAILY WITH A MEAL. 60 tablet 1  . fexofenadine (ALLEGRA) 60 MG tablet Take 60 mg by mouth daily.    . fluticasone (FLONASE) 50 MCG/ACT nasal spray Place 1 spray into both nostrils daily.    . furosemide (LASIX) 20 MG tablet Take 1 tablet (20 mg total) by mouth as needed (for weight gain). (Patient taking differently: Take 20 mg by mouth daily as needed (for weight gain). ) 30 tablet 3  . insulin glargine (LANTUS) 100 UNIT/ML injection Inject 32 Units into the skin at bedtime.     . insulin lispro (HUMALOG) 100 UNIT/ML injection Inject into the skin 3 (three) times daily before meals. slidescale    . Multiple Vitamin (MULTIVITAMIN WITH MINERALS) TABS tablet Take 1 tablet by mouth at bedtime.    . sacubitril-valsartan (ENTRESTO) 97-103 MG Take 1 tablet by mouth 2 (two) times daily. 180 tablet 3  . spironolactone (ALDACTONE) 25 MG tablet TAKE 1 TABLET(25 MG) BY MOUTH DAILY (Patient taking differently: Take 25 mg by mouth daily. TAKE 1 TABLET(25 MG) BY MOUTH DAILY) 30 tablet 6  . VICTOZA 18 MG/3ML SOPN Inject 1.8 mg into the skin daily.  10   No current facility-administered medications for this encounter.    Vitals:   11/06/18 1010  BP: (!) 130/94  Pulse: (!) 103  SpO2: 99%  Weight: 85.3 kg (188 lb)   Wt Readings from Last 3 Encounters:  11/06/18 85.3 kg (188 lb)  08/09/18 83.9 kg (185 lb)  06/17/18 86.5 kg (190 lb  9.6 oz)    PHYSICAL EXAM: General:  Well appearing. No resp difficulty HEENT: normal Neck: supple. no JVD. Carotids 2+ bilat; no bruits. No lymphadenopathy or thryomegaly appreciated. Cor: PMI nondisplaced. Tachy regular. No rubs, gallops or murmurs. Lungs: clear Abdomen: obese soft, nontender, nondistended. No  hepatosplenomegaly. No bruits or masses. Good bowel sounds. Extremities: no cyanosis, clubbing, rash, edema Neuro: alert & orientedx3, cranial nerves grossly intact. moves all 4 extremities w/o difficulty. Affect pleasant   EKG : NSR 96 bpm  QRS 90 ms   ASSESSMENT & PLAN: 1. Chronic Systolic CHF- 8/88/28MKLK EF 35%. - ? Viral Cardiomyopathy versus HTN. Also pre-eclampsia with both pregnancies.  ECHO (7/17) EF ->  35%. cMRI 10/17 EF 37%. ECHO 04/2017 EF 30-35%.  - Echo 2/19 EF 55-60% . Todays ECHO discussed and reviewed by Dr Haroldine Laws EF ~45-50%.  NYHA I. Volume status stable. Continue lasix as needed.  - Continue Entresto 97/103 mg BID. - Continue spiro 25 mg daily. - She is intolerant of carvedilol more than 6.25 bid due to fatigue.  - Intolerant Bidil due to headache. Failed on 2 separate occasions.  - Restart corlanor 5 mg twice a day. Tachycardic during ECHO  -Check BMET 2 HTN  Stable.  3 DM - Control improved. Per PCP. Consider Jardiance 4  OB - S/P tubal sterilization 06/2017.   5. Fatigue - No sleep apnea noted on sleep study. No change.  6. Palpitations - Holter monitor 08/2017 showed NSR with frequent PVCs (2%) with "fluttering" diary entries correlating with bigeminy Resolved.   Follow up Dr Haroldine Laws in 3 months.  She will need cardiology follow up in [place]. Plans move to [place] in May.  Check BMET    Darrick Grinder, NP   10:48 AM   Patient seen and examined with Darrick Grinder, NP. We discussed all aspects of the encounter. I agree with the assessment and plan as stated above.   Overall doing well. NYHA I-II. Volume status looks ok. Echo reviewed personally  and EF in 45-50% range. Remains tachycardic on exam and has not tolerated titration of other HF meds well. Will add back Corlanor 5 bid. I will see her one more time before she moves to [place] and we will help her arrange cardiac care there. Labs today.   Glori Bickers, MD  2:26 PM

## 2018-11-06 NOTE — Patient Instructions (Addendum)
Labs done today  EKG done today  START Corlanor 5mg  (1 tab) twice daily  Follow up with Dr. Haroldine Laws in 3 months

## 2018-12-22 ENCOUNTER — Other Ambulatory Visit (HOSPITAL_COMMUNITY): Payer: Self-pay | Admitting: Internal Medicine

## 2018-12-22 DIAGNOSIS — I5022 Chronic systolic (congestive) heart failure: Secondary | ICD-10-CM

## 2019-01-03 ENCOUNTER — Other Ambulatory Visit (HOSPITAL_COMMUNITY): Payer: Self-pay | Admitting: Cardiology

## 2019-01-03 IMAGING — CR DG CHEST 2V
2 series · 2 of 2 positions shown · non-contrast
Comparison: Chest x-ray dated December 20, 2016.

CLINICAL DATA: Chest pain for the past day.

EXAM:
CHEST  2 VIEW

[chest lat]
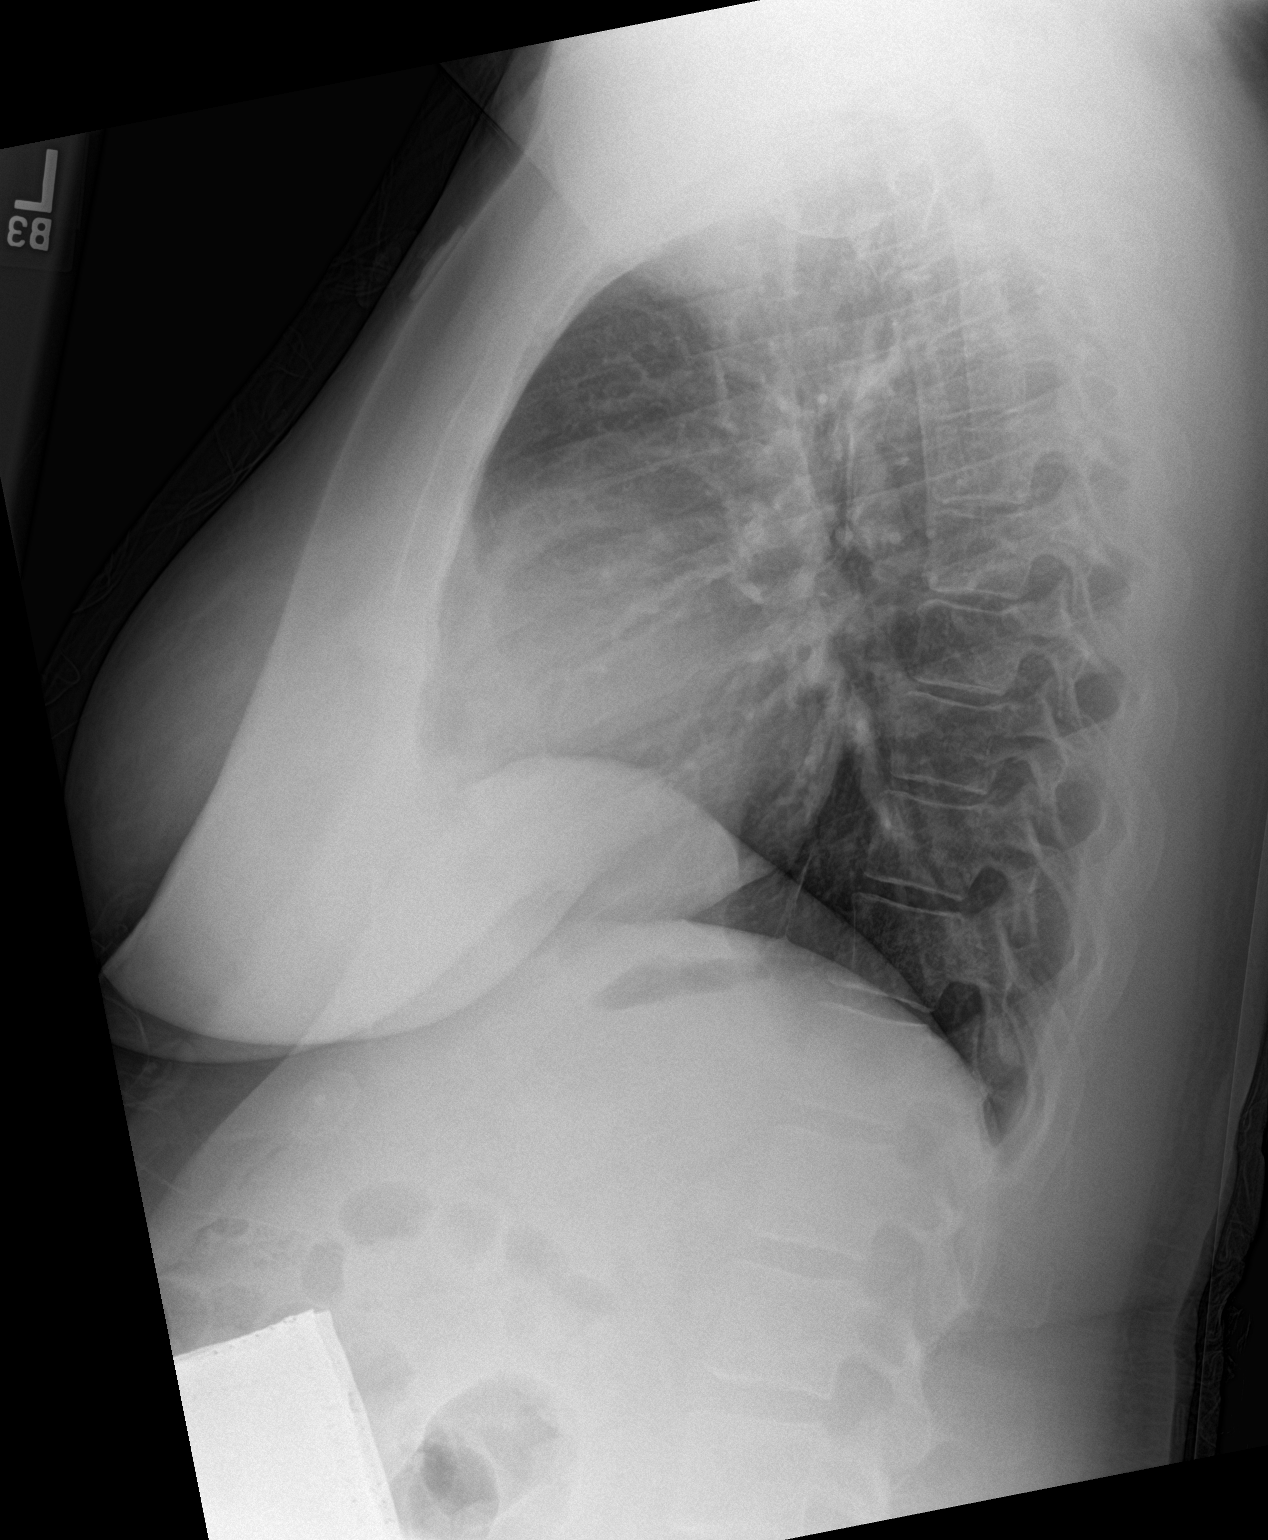

[chest ap]
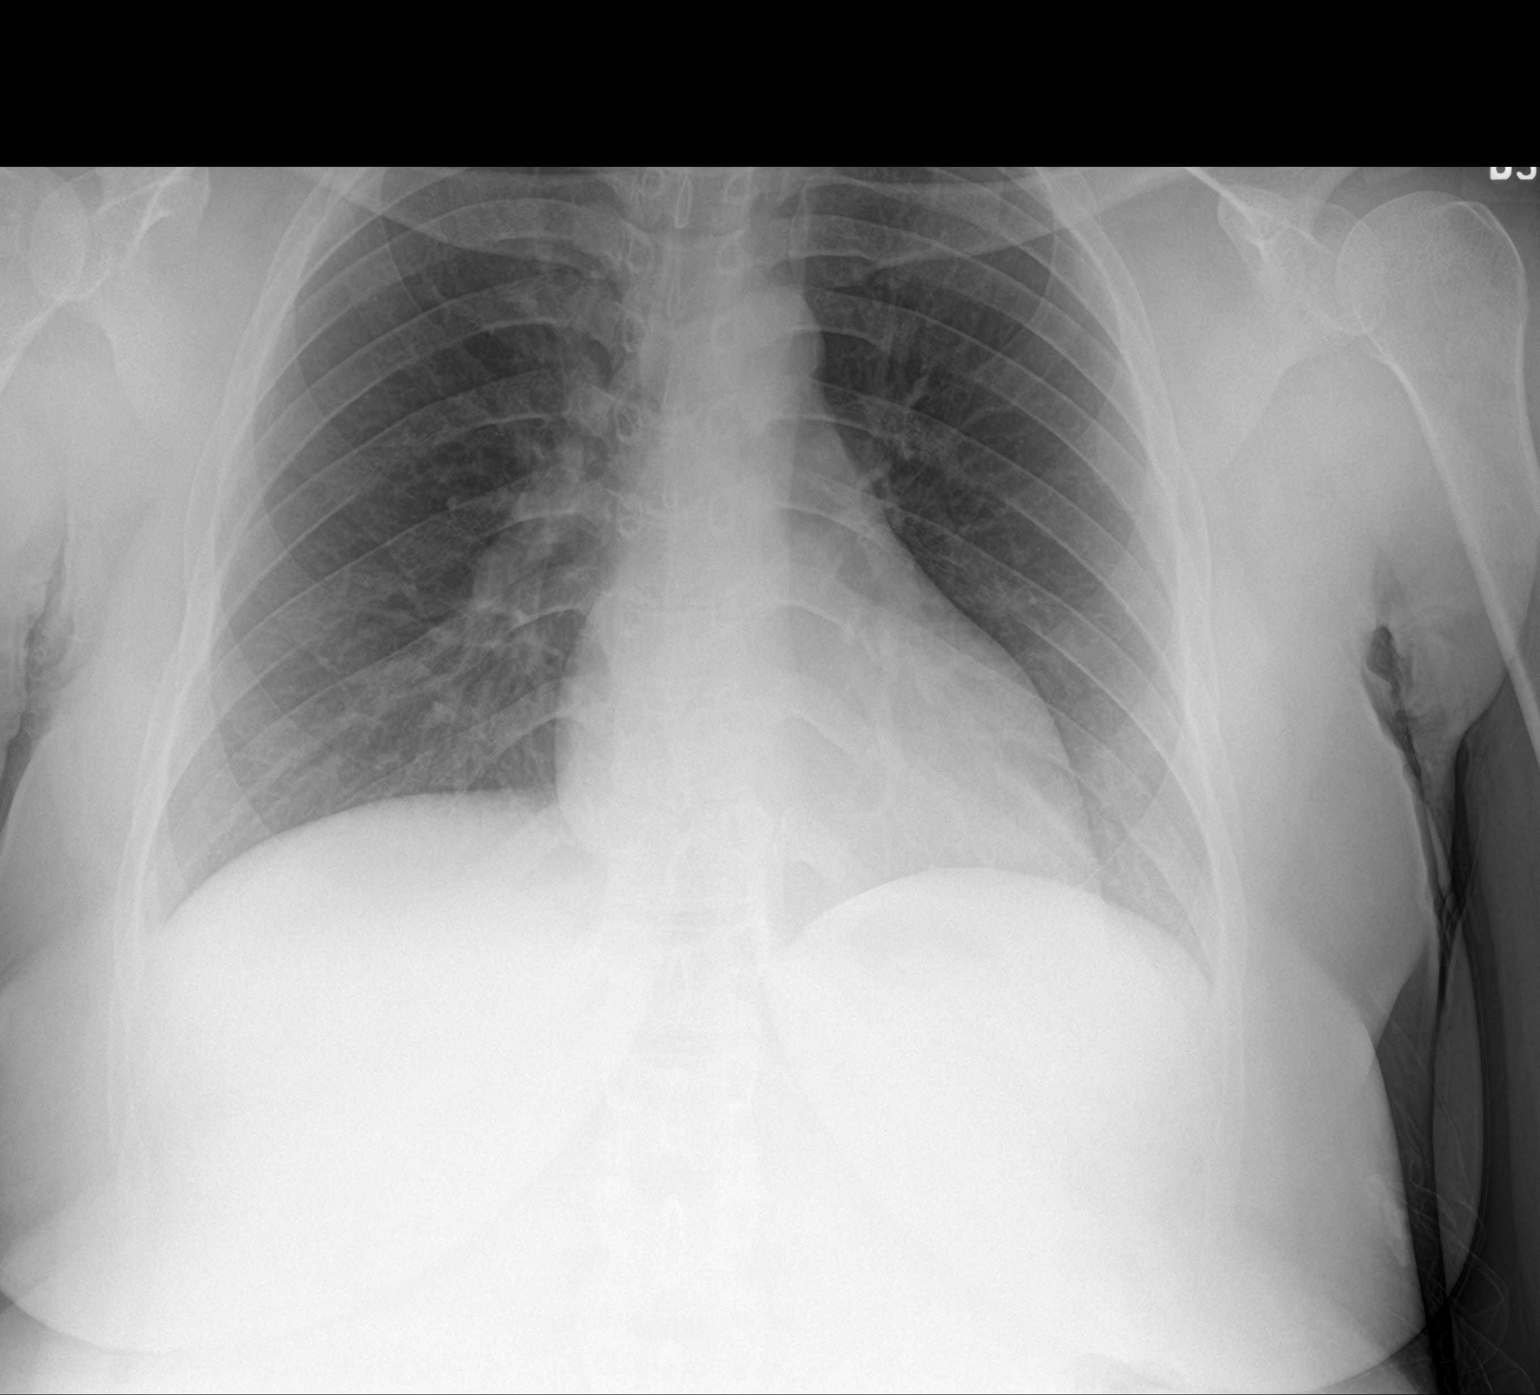

[2 of 2 positions shown; findings below may reference images not displayed]

FINDINGS: The heart size and mediastinal contours are within normal limits.
Both lungs are clear. The visualized skeletal structures are
unremarkable.
IMPRESSION: No active cardiopulmonary disease.

## 2019-01-18 ENCOUNTER — Other Ambulatory Visit (HOSPITAL_COMMUNITY): Payer: Self-pay | Admitting: Internal Medicine

## 2019-02-18 ENCOUNTER — Other Ambulatory Visit (HOSPITAL_COMMUNITY): Payer: Self-pay | Admitting: Internal Medicine

## 2019-02-24 ENCOUNTER — Telehealth (HOSPITAL_COMMUNITY): Payer: Self-pay | Admitting: Vascular Surgery

## 2019-02-24 NOTE — Telephone Encounter (Signed)
Spoke w/pt, telehealth visit sch for tomorrow at 1pm

## 2019-02-24 NOTE — Telephone Encounter (Signed)
Called pt to change appt 5/13 to televist or wait list, pt states she is moving to [facility] May 28th, she was told by DB that he wanted to see her in office before she moved..pt would like to speak to nurse concerning a in office visit / db before she moves

## 2019-02-25 ENCOUNTER — Other Ambulatory Visit: Payer: Self-pay

## 2019-02-25 ENCOUNTER — Ambulatory Visit (HOSPITAL_COMMUNITY)
Admission: RE | Admit: 2019-02-25 | Discharge: 2019-02-25 | Disposition: A | Discharge status: Home / Self Care | Payer: BLUE CROSS/BLUE SHIELD | Source: Ambulatory Visit | Attending: Internal Medicine | Admitting: Internal Medicine

## 2019-02-25 ENCOUNTER — Encounter (HOSPITAL_COMMUNITY): Payer: Self-pay | Admitting: *Deleted

## 2019-02-25 DIAGNOSIS — I5022 Chronic systolic (congestive) heart failure: Secondary | ICD-10-CM

## 2019-02-25 DIAGNOSIS — E119 Type 2 diabetes mellitus without complications: Secondary | ICD-10-CM | POA: Diagnosis not present

## 2019-02-25 DIAGNOSIS — R5383 Other fatigue: Secondary | ICD-10-CM

## 2019-02-25 DIAGNOSIS — I10 Essential (primary) hypertension: Secondary | ICD-10-CM

## 2019-02-25 DIAGNOSIS — R002 Palpitations: Secondary | ICD-10-CM

## 2019-02-25 DIAGNOSIS — Z9851 Tubal ligation status: Secondary | ICD-10-CM

## 2019-02-25 DIAGNOSIS — I110 Hypertensive heart disease with heart failure: Secondary | ICD-10-CM | POA: Diagnosis not present

## 2019-02-25 NOTE — Addendum Note (Signed)
Encounter addended by: Scarlette Calico, RN on: 02/25/2019 2:25 PM  Actions taken: Clinical Note Signed

## 2019-02-25 NOTE — Patient Instructions (Signed)
Good Luck in [place]  Please let us know if you need anything

## 2019-02-25 NOTE — Progress Notes (Signed)
AVS sent to pt via mychart. 

## 2019-02-25 NOTE — Progress Notes (Signed)
Heart Failure TeleHealth Note  Due to national recommendations of social distancing due to [place] 19, Audio/video telehealth visit is felt to be most appropriate for this patient at this time.  See MyChart message from today for patient consent regarding telehealth for [facility].  Date:  02/25/2019   ID:  Sara [name], DOB [birth year], MRN [id number]  Location: Home  Provider location: [facility] Type of Visit: Established patient  PCP:  Delrae Rend, MD  Cardiologist:  No primary care provider on file. Primary HF:   Chief Complaint: Heart Failure follow-up   History of Present Illness:  Sara [name] is a 33 y.o. 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 S/P Tubal ligation 06/2017.   Admitted  March 2017 with increased dsypnea and PND.  Prior  to admit she had a virus. CXR was concerning for CAP. Completed antibiotic course. ECHO performed and showed reduced EF --> 35%. Diuresed with IV lasix. Instructed to stop breast feeding with addition of HF meds and to prevent pregnancy. Started on entresto, carvedilol and lasix. Discharge weight was 175 pounds.   She  presents via Engineer, civil (consulting) for a telehealth visit today. Doing well. Feels fine. Will be moving to [place] on May 28 to get a Master's degree in Education.  No edema, orthopnea or PND. No problems with meds. Exercising 4x/week with stationary bike and heavy bag. Weight stable within 3-5 pound range.   Echo 1/20: EF 45-50%  Echo 2/19 EF 55-60%  cMRI 10/17 EF 37% RV normal ECHO 04/2016 ~35-40%  ECHO 12/2015 ~30-35%.  ECHO 04/2017 EF 30-35%.   CPX 7/17 FVC 2.68 (86%)    FEV1 2.43 (91%)       BP rest: 118/64 BP peak: 156/84 Peak VO2: 17.9 (63.9% predicted peak VO2) - corrects to 23.4 fo IBW VE/VCO2 slope: 31.2 OUES: 1.63 Peak RER: 1.17 Ventilatory Threshold: 13.9  (49.6% predicted or measured peak VO2) VE/MVV: 55.9% O2pulse: 10  (83% predicted O2pulse)   Sara [name] denies symptoms worrisome for COVID 19.   Past Medical History:  Diagnosis Date  . Anemia   . Anxiety    diagnosed in 2007  . CHF (congestive heart failure) ([place])   . Diabetes mellitus    type 2 - uncontrolled  . Enlarged thyroid   . Hypertension    benign essential  . Infected pilonidal cyst   . Migraine   . Non-reassuring fetal heart rate or rhythm affecting mother   . Pneumonia    history of   . Vision abnormalities    Past Surgical History:  Procedure Laterality Date  . LAPAROSCOPIC TUBAL LIGATION Bilateral 07/03/2017   Procedure: LAPAROSCOPIC BILATERAL TUBAL LIGATION;  Surgeon: Alden Hipp, MD;  Location: [facility];  Service: Gynecology;  Laterality: Bilateral;  . NO PAST SURGERIES       Current Outpatient Medications  Medication Sig Dispense Refill  . ALPRAZolam (XANAX) 0.5 MG tablet Take 0.5 mg by mouth 2 (two) times daily as needed for anxiety.    Marland Kitchen amLODipine (NORVASC) 5 MG tablet TAKE 1 TABLET BY MOUTH DAILY 90 tablet 0  . butalbital-acetaminophen-caffeine (FIORICET, ESGIC) 50-325-40 MG tablet Take 1 tablet by mouth every 4 (four) hours as needed for headache. 14 tablet 0  . carvedilol (COREG) 6.25 MG tablet TAKE 1 TABLET BY MOUTH TWICE DAILY WITH A MEAL 60 tablet 1  . ENTRESTO 97-103 MG  TAKE 1 TABLET BY MOUTH TWICE DAILY 180 tablet 3  . fexofenadine (ALLEGRA) 60 MG tablet Take 60 mg by mouth daily.    . fluticasone (FLONASE) 50 MCG/ACT nasal spray Place 1 spray into both nostrils daily.    . furosemide (LASIX) 20 MG tablet Take 1 tablet (20 mg total) by mouth as needed (for weight gain). (Patient taking differently: Take 20 mg by mouth daily as needed (for weight gain). ) 30 tablet 3  . insulin glargine (LANTUS) 100 UNIT/ML injection Inject 32 Units into the skin at bedtime.     . insulin lispro (HUMALOG) 100 UNIT/ML injection Inject into the skin 3  (three) times daily before meals. slidescale    . ivabradine (CORLANOR) 5 MG TABS tablet Take 1 tablet (5 mg total) by mouth 2 (two) times daily with a meal. 60 tablet 3  . Multiple Vitamin (MULTIVITAMIN WITH MINERALS) TABS tablet Take 1 tablet by mouth at bedtime.    Marland Kitchen spironolactone (ALDACTONE) 25 MG tablet Take 1 tablet (25 mg total) by mouth daily. TAKE 1 TABLET(25 MG) BY MOUTH DAILY 30 tablet 5  . VICTOZA 18 MG/3ML SOPN Inject 1.8 mg into the skin daily.  10   No current facility-administered medications for this encounter.     Allergies:   Septra ds [sulfamethoxazole-trimethoprim]; Almond (diagnostic); Amoxapine and related; Bidil [isosorb dinitrate-hydralazine]; Lactose intolerance (gi); Lobster [shellfish allergy]; Pineapple; Latex; Tape; and Terconazole   Social History:  The patient  reports that she has never smoked. She has never used smokeless tobacco. She reports current alcohol use. She reports that she does not use drugs.   Family History:  The patient's family history includes Diabetes in her maternal grandmother, mother, and paternal grandmother; Healthy in her brother and brother; Hypertension in her father; Migraines in her mother.   ROS:  Please see the history of present illness.   All other systems are personally reviewed and negative.   Exam:  (Video/Tele Health Call; Exam is subjective and or/visual.) General:  Speaks in full sentences. No resp difficulty. Lungs: Normal respiratory effort with conversation.  Abdomen: Non-distended per patient report Extremities: Pt denies edema. Neuro: Alert & oriented x 3.   Recent Labs: 09/21/2018: ALT 42; Hemoglobin 12.4; Platelets 243 11/06/2018: BUN 9; Creatinine, Ser 0.77; Potassium 4.3; Sodium 137  Personally reviewed   Wt Readings from Last 3 Encounters:  11/06/18 85.3 kg (188 lb)  08/09/18 83.9 kg (185 lb)  06/17/18 86.5 kg (190 lb 9.6 oz)      ASSESSMENT AND PLAN:  1. Chronic Systolic CHF- 2/02[birth year]54YHCW EF 35%. -  ? Viral Cardiomyopathy versus HTN. Also pre-eclampsia with both pregnancies.  - ECHO (7/17) EF ->  35%. cMRI 10/17 EF 37% No LGE. ECHO 04/2017 EF 30-35%.  - Echo 2/19 EF 55-60% .  - Echo 1/20  EF 45-50%.  - Doing well. NYHA I. Volume status and weight stable. Continue lasix as needed.  - Continue Entresto 97/103 mg BID. - Continue spiro 25 mg daily. - She is intolerant of carvedilol more than 6.25 bid due to fatigue.  - Intolerant Bidil due to headache. Failed on 2 separate occasions.  - Continue corlanor 5 mg twice a day given issues with persistent tachycardia.  - We weill need to find her a cardiologist in [facility]  2 HTN  -Blood pressure well controlled. Continue current regimen. 3 DM - Control improved. Per PCP. Consider Jardiance 4  OB - S/P tubal sterilization 06/2017.   5. Fatigue -  No sleep apnea noted on sleep study. No change.  6. Palpitations - Holter monitor 08/2017 showed NSR with frequent PVCs (2%) with "fluttering" diary entries correlating with bigeminy - Resolved.   COVID screen The patient does not have any symptoms that suggest any further testing/ screening at this time.  Social distancing reinforced today.  Recommended follow-up:  As needed (moving to [facility])   Relevant cardiac medications were reviewed at length with the patient today.   The patient does not have concerns regarding their medications at this time.   The following changes were made today:  As above  Today, I have spent 15 minutes with the patient with telehealth technology discussing the above issues .    Signed, Glori Bickers, MD  02/25/2019 10:12 AM  [facility] [place] [address] [phone] (office) [phone] (fax)

## 2019-02-26 ENCOUNTER — Telehealth (HOSPITAL_COMMUNITY): Payer: BLUE CROSS/BLUE SHIELD | Admitting: Internal Medicine

## 2019-03-12 ENCOUNTER — Encounter (HOSPITAL_COMMUNITY): Payer: BLUE CROSS/BLUE SHIELD | Admitting: Internal Medicine

## 2019-03-13 ENCOUNTER — Other Ambulatory Visit (HOSPITAL_COMMUNITY): Payer: Self-pay | Admitting: Internal Medicine

## 2019-03-26 ENCOUNTER — Telehealth (HOSPITAL_COMMUNITY): Payer: Self-pay

## 2019-03-26 NOTE — Telephone Encounter (Signed)
PA attempted for [facility] 97/103mg  however covered by plan. No further action needed at this time.

## 2019-04-18 ENCOUNTER — Other Ambulatory Visit (HOSPITAL_COMMUNITY): Payer: Self-pay | Admitting: Internal Medicine

## 2019-05-19 ENCOUNTER — Other Ambulatory Visit (HOSPITAL_COMMUNITY): Payer: Self-pay

## 2019-05-19 MED ORDER — IVABRADINE HCL 5 MG PO TABS: ORAL_TABLET | ORAL | 3 refills | Status: DC

## 2019-05-22 ENCOUNTER — Other Ambulatory Visit (HOSPITAL_COMMUNITY): Payer: Self-pay

## 2019-05-22 MED ORDER — IVABRADINE HCL 5 MG PO TABS: ORAL_TABLET | ORAL | 3 refills | Status: DC

## 2019-05-26 ENCOUNTER — Other Ambulatory Visit (HOSPITAL_COMMUNITY): Payer: Self-pay

## 2019-05-26 MED ORDER — IVABRADINE HCL 5 MG PO TABS: ORAL_TABLET | ORAL | 3 refills | Status: DC

## 2019-05-28 ENCOUNTER — Telehealth (HOSPITAL_COMMUNITY): Payer: Self-pay | Admitting: Pharmacy Technician

## 2019-05-28 ENCOUNTER — Other Ambulatory Visit (HOSPITAL_COMMUNITY): Payer: Self-pay

## 2019-05-28 MED ORDER — IVABRADINE HCL 5 MG PO TABS: ORAL_TABLET | ORAL | 3 refills | Status: DC

## 2019-05-28 NOTE — Telephone Encounter (Signed)
Received notification from [facility] that prior authorization for Corlanor is required.  Patient gave new insurance information.  Pharmacy BIN: [id number] Pharmacy PCN: [place] Group: [id number]  ID: [id number]  Attempted to submit PA CoverMyMeds, [place] [id number]. Had to go to [email or web address] to fill out request.  Prior Auth (EOC) ID:  [id number]  Charlann Boxer, CPhT

## 2019-06-02 ENCOUNTER — Other Ambulatory Visit (HOSPITAL_COMMUNITY): Payer: Self-pay

## 2019-06-02 MED ORDER — IVABRADINE HCL 5 MG PO TABS: ORAL_TABLET | ORAL | 3 refills | Status: DC

## 2019-06-03 NOTE — Telephone Encounter (Signed)
Received forms from [employer] requesting more info for the PA of Corlanor.    Forms completed and signed by Dr Haroldine Laws and faxed back to them at [phone]

## 2019-06-05 NOTE — Telephone Encounter (Signed)
Called to check the status of the Prior Authorization and information sent for Corlanor. Insurance stated that we would have an answer by August 10th.  Will continue to follow up on this.  Charlann Boxer, CPhT

## 2019-06-10 NOTE — Telephone Encounter (Signed)
Advanced Heart Failure Patient Advocate Encounter  Prior Authorization for Corlanor 5mg  has been approved.    PA# [id number] Effective dates: 06/09/2019 through 06/04/2020  Patients co-pay is $60.00 for a 30 Day supply.  Was able to obtain a co-pay card to bring patient's co-pay to $20 monthly.  BIN: [id number] PCN: CN Group: [id number]  ID: [id number]  Called patient to make her aware of this approval and to give her co-pay card information.  Called [facility] in [place] and gave co-pay card information and to ensure that the claim went through successfully. They are getting the medication ready for her.  Also, she stated she is in the process of setting up care in [place].   Charlann Boxer, CPhT

## 2019-06-12 ENCOUNTER — Other Ambulatory Visit (HOSPITAL_COMMUNITY): Payer: Self-pay | Admitting: Cardiology

## 2019-06-27 ENCOUNTER — Other Ambulatory Visit (HOSPITAL_COMMUNITY): Payer: Self-pay | Admitting: Internal Medicine

## 2019-06-27 DIAGNOSIS — I5022 Chronic systolic (congestive) heart failure: Secondary | ICD-10-CM

## 2019-10-13 ENCOUNTER — Other Ambulatory Visit (HOSPITAL_COMMUNITY): Payer: Self-pay

## 2019-10-13 MED ORDER — IVABRADINE HCL 5 MG PO TABS: ORAL_TABLET | ORAL | 3 refills | Status: AC

## 2020-03-15 ENCOUNTER — Other Ambulatory Visit (HOSPITAL_COMMUNITY): Payer: Self-pay

## 2020-03-15 MED ORDER — ENTRESTO 97-103 MG PO TABS: 1.0000 | ORAL_TABLET | Freq: Two times a day (BID) | ORAL | 0 refills | Status: AC

## 2020-04-19 ENCOUNTER — Other Ambulatory Visit (HOSPITAL_COMMUNITY): Payer: Self-pay | Admitting: *Deleted

## 2020-04-19 MED ORDER — AMLODIPINE BESYLATE 5 MG PO TABS: 5.0000 mg | ORAL_TABLET | Freq: Every day | ORAL | 0 refills | Status: AC

## 2020-04-22 ENCOUNTER — Other Ambulatory Visit (HOSPITAL_COMMUNITY): Payer: Self-pay | Admitting: *Deleted
